# Patient Record
Sex: Male | Born: 1945 | Race: Black or African American | Hispanic: No | Marital: Married | Smoking: Former smoker
Health system: Southern US, Community
[De-identification: ages and names within clinical notes are randomized; demographics above are authoritative.]

## PROBLEM LIST (undated history)

## (undated) DIAGNOSIS — E785 Hyperlipidemia, unspecified: Secondary | ICD-10-CM

## (undated) DIAGNOSIS — M75100 Unspecified rotator cuff tear or rupture of unspecified shoulder, not specified as traumatic: Secondary | ICD-10-CM

## (undated) DIAGNOSIS — K7581 Nonalcoholic steatohepatitis (NASH): Secondary | ICD-10-CM

## (undated) DIAGNOSIS — F411 Generalized anxiety disorder: Secondary | ICD-10-CM

## (undated) DIAGNOSIS — D126 Benign neoplasm of colon, unspecified: Secondary | ICD-10-CM

## (undated) DIAGNOSIS — K7689 Other specified diseases of liver: Secondary | ICD-10-CM

## (undated) DIAGNOSIS — K648 Other hemorrhoids: Secondary | ICD-10-CM

## (undated) DIAGNOSIS — F528 Other sexual dysfunction not due to a substance or known physiological condition: Secondary | ICD-10-CM

## (undated) DIAGNOSIS — F329 Major depressive disorder, single episode, unspecified: Secondary | ICD-10-CM

## (undated) DIAGNOSIS — K5790 Diverticulosis of intestine, part unspecified, without perforation or abscess without bleeding: Secondary | ICD-10-CM

## (undated) DIAGNOSIS — R7309 Other abnormal glucose: Secondary | ICD-10-CM

## (undated) DIAGNOSIS — Z86718 Personal history of other venous thrombosis and embolism: Secondary | ICD-10-CM

## (undated) DIAGNOSIS — I872 Venous insufficiency (chronic) (peripheral): Secondary | ICD-10-CM

## (undated) HISTORY — PX: COLONOSCOPY: SHX174

## (undated) HISTORY — DX: Hyperlipidemia, unspecified: E78.5

## (undated) HISTORY — DX: Other hemorrhoids: K64.8

## (undated) HISTORY — DX: Other specified diseases of liver: K76.89

## (undated) HISTORY — DX: Nonalcoholic steatohepatitis (NASH): K75.81

## (undated) HISTORY — DX: Unspecified rotator cuff tear or rupture of unspecified shoulder, not specified as traumatic: M75.100

## (undated) HISTORY — DX: Personal history of other venous thrombosis and embolism: Z86.718

## (undated) HISTORY — DX: Other abnormal glucose: R73.09

## (undated) HISTORY — DX: Benign neoplasm of colon, unspecified: D12.6

## (undated) HISTORY — DX: Diverticulosis of intestine, part unspecified, without perforation or abscess without bleeding: K57.90

## (undated) HISTORY — DX: Generalized anxiety disorder: F41.1

## (undated) HISTORY — DX: Major depressive disorder, single episode, unspecified: F32.9

## (undated) HISTORY — DX: Other sexual dysfunction not due to a substance or known physiological condition: F52.8

## (undated) HISTORY — DX: Venous insufficiency (chronic) (peripheral): I87.2

---

## 1988-05-11 HISTORY — PX: OTHER SURGICAL HISTORY: SHX169

## 2001-05-11 HISTORY — PX: OTHER SURGICAL HISTORY: SHX169

## 2001-07-09 DIAGNOSIS — D126 Benign neoplasm of colon, unspecified: Secondary | ICD-10-CM

## 2001-07-09 HISTORY — DX: Benign neoplasm of colon, unspecified: D12.6

## 2001-07-27 LAB — HM COLONOSCOPY

## 2001-08-19 ENCOUNTER — Encounter: Payer: Self-pay | Admitting: Vascular Surgery

## 2001-08-22 ENCOUNTER — Ambulatory Visit (HOSPITAL_COMMUNITY): Admission: RE | Admit: 2001-08-22 | Discharge: 2001-08-22 | Payer: Self-pay | Admitting: Vascular Surgery

## 2001-08-22 ENCOUNTER — Encounter (INDEPENDENT_AMBULATORY_CARE_PROVIDER_SITE_OTHER): Payer: Self-pay | Admitting: *Deleted

## 2001-09-28 DIAGNOSIS — D126 Benign neoplasm of colon, unspecified: Secondary | ICD-10-CM

## 2002-05-05 ENCOUNTER — Encounter: Payer: Self-pay | Admitting: Internal Medicine

## 2002-05-05 ENCOUNTER — Ambulatory Visit (HOSPITAL_COMMUNITY): Admission: RE | Admit: 2002-05-05 | Discharge: 2002-05-05 | Payer: Self-pay | Admitting: Internal Medicine

## 2005-01-14 ENCOUNTER — Ambulatory Visit: Payer: Self-pay | Admitting: Endocrinology

## 2005-01-21 ENCOUNTER — Ambulatory Visit: Payer: Self-pay | Admitting: Endocrinology

## 2005-01-29 ENCOUNTER — Ambulatory Visit: Payer: Self-pay | Admitting: Endocrinology

## 2005-05-08 ENCOUNTER — Ambulatory Visit: Payer: Self-pay | Admitting: Endocrinology

## 2005-05-29 ENCOUNTER — Ambulatory Visit: Payer: Self-pay | Admitting: Endocrinology

## 2005-10-30 ENCOUNTER — Ambulatory Visit: Payer: Self-pay | Admitting: Internal Medicine

## 2005-11-13 ENCOUNTER — Ambulatory Visit: Payer: Self-pay

## 2005-11-13 ENCOUNTER — Encounter: Payer: Self-pay | Admitting: Cardiology

## 2006-01-28 ENCOUNTER — Ambulatory Visit: Payer: Self-pay | Admitting: Endocrinology

## 2006-01-29 ENCOUNTER — Ambulatory Visit: Payer: Self-pay | Admitting: Endocrinology

## 2006-02-04 ENCOUNTER — Ambulatory Visit: Payer: Self-pay | Admitting: Endocrinology

## 2007-02-14 ENCOUNTER — Ambulatory Visit: Payer: Self-pay | Admitting: Endocrinology

## 2007-02-14 LAB — CONVERTED CEMR LAB
ALT: 45 units/L (ref 0–53)
AST: 46 units/L — ABNORMAL HIGH (ref 0–37)
Albumin: 4.4 g/dL (ref 3.5–5.2)
Alkaline Phosphatase: 37 units/L — ABNORMAL LOW (ref 39–117)
BUN: 13 mg/dL (ref 6–23)
Basophils Absolute: 0.2 10*3/uL — ABNORMAL HIGH (ref 0.0–0.1)
Basophils Relative: 2.7 % — ABNORMAL HIGH (ref 0.0–1.0)
Bilirubin Urine: NEGATIVE
Bilirubin, Direct: 0.3 mg/dL (ref 0.0–0.3)
CO2: 28 meq/L (ref 19–32)
Calcium: 9.6 mg/dL (ref 8.4–10.5)
Chloride: 105 meq/L (ref 96–112)
Cholesterol: 162 mg/dL (ref 0–200)
Creatinine, Ser: 1 mg/dL (ref 0.4–1.5)
Crystals: NEGATIVE
Eosinophils Absolute: 0.3 10*3/uL (ref 0.0–0.6)
Eosinophils Relative: 3.1 % (ref 0.0–5.0)
GFR calc Af Amer: 98 mL/min
GFR calc non Af Amer: 81 mL/min
Glucose, Bld: 93 mg/dL (ref 70–99)
HCT: 47.5 % (ref 39.0–52.0)
HDL: 52.3 mg/dL (ref >39.0)
Hemoglobin, Urine: NEGATIVE
Hemoglobin: 16.6 g/dL (ref 13.0–17.0)
Ketones, ur: NEGATIVE mg/dL
LDL Cholesterol: 88 mg/dL (ref 0–99)
Lymphocytes Relative: 24.7 % (ref 12.0–46.0)
MCHC: 34.9 g/dL (ref 30.0–36.0)
MCV: 87.5 fL (ref 78.0–100.0)
Monocytes Absolute: 0.7 10*3/uL (ref 0.2–0.7)
Monocytes Relative: 8 % (ref 3.0–11.0)
Neutro Abs: 5.4 10*3/uL (ref 1.4–7.7)
Neutrophils Relative %: 61.5 % (ref 43.0–77.0)
Nitrite: NEGATIVE
PSA: 0.7 ng/mL (ref 0.10–4.00)
Platelets: 214 10*3/uL (ref 150–400)
Potassium: 3.9 meq/L (ref 3.5–5.1)
RBC / HPF: NONE SEEN
RBC: 5.43 M/uL (ref 4.22–5.81)
RDW: 12.9 % (ref 11.5–14.6)
Sodium: 142 meq/L (ref 135–145)
Specific Gravity, Urine: 1.02 (ref 1.000–1.03)
Squamous Epithelial / HPF: NEGATIVE /lpf
TSH: 3.46 microintl units/mL (ref 0.35–5.50)
Total Bilirubin: 1.2 mg/dL (ref 0.3–1.2)
Total CHOL/HDL Ratio: 3.1
Total Protein, Urine: 30 mg/dL — AB
Total Protein: 8 g/dL (ref 6.0–8.3)
Triglycerides: 109 mg/dL (ref 0–149)
Urine Glucose: NEGATIVE mg/dL
Urobilinogen, UA: 1 (ref 0.0–1.0)
VLDL: 22 mg/dL (ref 0–40)
WBC: 8.7 10*3/uL (ref 4.5–10.5)
pH: 7.5 (ref 5.0–8.0)

## 2007-02-16 ENCOUNTER — Encounter: Payer: Self-pay | Admitting: *Deleted

## 2007-02-16 DIAGNOSIS — Z86718 Personal history of other venous thrombosis and embolism: Secondary | ICD-10-CM

## 2007-02-16 DIAGNOSIS — F411 Generalized anxiety disorder: Secondary | ICD-10-CM

## 2007-02-16 DIAGNOSIS — R7309 Other abnormal glucose: Secondary | ICD-10-CM

## 2007-02-16 DIAGNOSIS — D72819 Decreased white blood cell count, unspecified: Secondary | ICD-10-CM

## 2007-02-16 DIAGNOSIS — I872 Venous insufficiency (chronic) (peripheral): Secondary | ICD-10-CM

## 2007-02-16 DIAGNOSIS — F528 Other sexual dysfunction not due to a substance or known physiological condition: Secondary | ICD-10-CM

## 2007-02-16 DIAGNOSIS — F329 Major depressive disorder, single episode, unspecified: Secondary | ICD-10-CM

## 2007-02-16 DIAGNOSIS — F17200 Nicotine dependence, unspecified, uncomplicated: Secondary | ICD-10-CM

## 2007-02-16 DIAGNOSIS — R739 Hyperglycemia, unspecified: Secondary | ICD-10-CM | POA: Insufficient documentation

## 2007-02-16 DIAGNOSIS — F3289 Other specified depressive episodes: Secondary | ICD-10-CM

## 2007-02-16 HISTORY — DX: Personal history of other venous thrombosis and embolism: Z86.718

## 2007-02-16 HISTORY — DX: Major depressive disorder, single episode, unspecified: F32.9

## 2007-02-16 HISTORY — DX: Generalized anxiety disorder: F41.1

## 2007-02-16 HISTORY — DX: Other specified depressive episodes: F32.89

## 2007-02-16 HISTORY — DX: Other sexual dysfunction not due to a substance or known physiological condition: F52.8

## 2007-02-16 HISTORY — DX: Venous insufficiency (chronic) (peripheral): I87.2

## 2007-02-16 HISTORY — DX: Other abnormal glucose: R73.09

## 2007-02-17 ENCOUNTER — Ambulatory Visit: Payer: Self-pay | Admitting: Endocrinology

## 2007-03-04 ENCOUNTER — Ambulatory Visit: Payer: Self-pay | Admitting: Endocrinology

## 2007-03-04 LAB — CONVERTED CEMR LAB
Bilirubin Urine: NEGATIVE
Hemoglobin, Urine: NEGATIVE
Ketones, ur: NEGATIVE mg/dL
Leukocytes, UA: NEGATIVE
Nitrite: NEGATIVE
Specific Gravity, Urine: 1.015 (ref 1.000–1.03)
Total Protein, Urine: NEGATIVE mg/dL
Urine Glucose: NEGATIVE mg/dL
Urobilinogen, UA: 1 (ref 0.0–1.0)
pH: 8 (ref 5.0–8.0)

## 2007-03-18 ENCOUNTER — Encounter: Payer: Self-pay | Admitting: Endocrinology

## 2007-03-21 ENCOUNTER — Telehealth: Payer: Self-pay | Admitting: Endocrinology

## 2008-04-27 ENCOUNTER — Telehealth (INDEPENDENT_AMBULATORY_CARE_PROVIDER_SITE_OTHER): Payer: Self-pay | Admitting: *Deleted

## 2008-05-30 ENCOUNTER — Ambulatory Visit: Payer: Self-pay | Admitting: Endocrinology

## 2008-05-31 LAB — CONVERTED CEMR LAB
ALT: 28 units/L (ref 0–53)
AST: 39 units/L — ABNORMAL HIGH (ref 0–37)
Albumin: 4.1 g/dL (ref 3.5–5.2)
Alkaline Phosphatase: 33 units/L — ABNORMAL LOW (ref 39–117)
BUN: 18 mg/dL (ref 6–23)
Basophils Absolute: 0 10*3/uL (ref 0.0–0.1)
Basophils Relative: 0.6 % (ref 0.0–3.0)
Bilirubin Urine: NEGATIVE
Bilirubin, Direct: 0.3 mg/dL (ref 0.0–0.3)
CO2: 30 meq/L (ref 19–32)
Calcium: 9.2 mg/dL (ref 8.4–10.5)
Chloride: 105 meq/L (ref 96–112)
Cholesterol: 134 mg/dL (ref 0–200)
Creatinine, Ser: 1 mg/dL (ref 0.4–1.5)
Eosinophils Absolute: 0.3 10*3/uL (ref 0.0–0.7)
Eosinophils Relative: 3.7 % (ref 0.0–5.0)
GFR calc Af Amer: 97 mL/min
GFR calc non Af Amer: 80 mL/min
Glucose, Bld: 79 mg/dL (ref 70–99)
HCT: 45.3 % (ref 39.0–52.0)
HDL: 41.2 mg/dL (ref >39.0)
Hemoglobin, Urine: NEGATIVE
Hemoglobin: 15.9 g/dL (ref 13.0–17.0)
Ketones, ur: NEGATIVE mg/dL
LDL Cholesterol: 68 mg/dL (ref 0–99)
Leukocytes, UA: NEGATIVE
Lymphocytes Relative: 32.5 % (ref 12.0–46.0)
MCHC: 35.1 g/dL (ref 30.0–36.0)
MCV: 85.5 fL (ref 78.0–100.0)
Monocytes Absolute: 0.6 10*3/uL (ref 0.1–1.0)
Monocytes Relative: 8.4 % (ref 3.0–12.0)
Neutro Abs: 3.8 10*3/uL (ref 1.4–7.7)
Neutrophils Relative %: 54.8 % (ref 43.0–77.0)
Nitrite: NEGATIVE
PSA: 0.63 ng/mL (ref 0.10–4.00)
Platelets: 201 10*3/uL (ref 150–400)
Potassium: 3.6 meq/L (ref 3.5–5.1)
RBC: 5.29 M/uL (ref 4.22–5.81)
RDW: 12.6 % (ref 11.5–14.6)
Sodium: 140 meq/L (ref 135–145)
Specific Gravity, Urine: 1.015 (ref 1.000–1.03)
TSH: 2.13 microintl units/mL (ref 0.35–5.50)
Total Bilirubin: 1.6 mg/dL — ABNORMAL HIGH (ref 0.3–1.2)
Total CHOL/HDL Ratio: 3.3
Total Protein: 7.5 g/dL (ref 6.0–8.3)
Triglycerides: 126 mg/dL (ref 0–149)
Urine Glucose: NEGATIVE mg/dL
Urobilinogen, UA: 1 (ref 0.0–1.0)
VLDL: 25 mg/dL (ref 0–40)
WBC: 6.9 10*3/uL (ref 4.5–10.5)
pH: 7.5 (ref 5.0–8.0)

## 2008-06-07 ENCOUNTER — Ambulatory Visit: Payer: Self-pay | Admitting: Endocrinology

## 2008-06-07 DIAGNOSIS — K7689 Other specified diseases of liver: Secondary | ICD-10-CM

## 2008-06-07 DIAGNOSIS — M75100 Unspecified rotator cuff tear or rupture of unspecified shoulder, not specified as traumatic: Secondary | ICD-10-CM | POA: Insufficient documentation

## 2008-06-07 DIAGNOSIS — R0609 Other forms of dyspnea: Secondary | ICD-10-CM

## 2008-06-07 DIAGNOSIS — IMO0002 Reserved for concepts with insufficient information to code with codable children: Secondary | ICD-10-CM

## 2008-06-07 DIAGNOSIS — R0600 Dyspnea, unspecified: Secondary | ICD-10-CM | POA: Insufficient documentation

## 2008-06-07 DIAGNOSIS — K7581 Nonalcoholic steatohepatitis (NASH): Secondary | ICD-10-CM

## 2008-06-07 DIAGNOSIS — F1010 Alcohol abuse, uncomplicated: Secondary | ICD-10-CM | POA: Insufficient documentation

## 2008-06-07 HISTORY — DX: Other specified diseases of liver: K76.89

## 2008-06-07 HISTORY — DX: Reserved for concepts with insufficient information to code with codable children: IMO0002

## 2008-06-07 HISTORY — DX: Unspecified rotator cuff tear or rupture of unspecified shoulder, not specified as traumatic: M75.100

## 2008-08-31 ENCOUNTER — Encounter: Payer: Self-pay | Admitting: Endocrinology

## 2008-12-12 ENCOUNTER — Ambulatory Visit: Payer: Self-pay | Admitting: Internal Medicine

## 2008-12-12 DIAGNOSIS — R079 Chest pain, unspecified: Secondary | ICD-10-CM | POA: Insufficient documentation

## 2008-12-20 ENCOUNTER — Telehealth: Payer: Self-pay | Admitting: Endocrinology

## 2009-01-26 ENCOUNTER — Telehealth: Payer: Self-pay | Admitting: Family Medicine

## 2009-02-04 ENCOUNTER — Telehealth: Payer: Self-pay | Admitting: Endocrinology

## 2009-02-26 ENCOUNTER — Telehealth: Payer: Self-pay | Admitting: Endocrinology

## 2009-07-22 ENCOUNTER — Ambulatory Visit: Payer: Self-pay | Admitting: Endocrinology

## 2009-07-23 LAB — CONVERTED CEMR LAB
ALT: 41 units/L (ref 0–53)
AST: 51 units/L — ABNORMAL HIGH (ref 0–37)
Albumin: 4.3 g/dL (ref 3.5–5.2)
Alkaline Phosphatase: 38 units/L — ABNORMAL LOW (ref 39–117)
BUN: 15 mg/dL (ref 6–23)
Basophils Absolute: 0 10*3/uL (ref 0.0–0.1)
Basophils Relative: 0.6 % (ref 0.0–3.0)
Bilirubin Urine: NEGATIVE
Bilirubin, Direct: 0.2 mg/dL (ref 0.0–0.3)
CO2: 30 meq/L (ref 19–32)
Calcium: 9.5 mg/dL (ref 8.4–10.5)
Chloride: 104 meq/L (ref 96–112)
Cholesterol: 191 mg/dL (ref 0–200)
Creatinine, Ser: 0.9 mg/dL (ref 0.4–1.5)
Eosinophils Absolute: 0.3 10*3/uL (ref 0.0–0.7)
Eosinophils Relative: 4.6 % (ref 0.0–5.0)
GFR calc non Af Amer: 109.55 mL/min (ref >60)
Glucose, Bld: 76 mg/dL (ref 70–99)
HCT: 47.9 % (ref 39.0–52.0)
HDL: 58.7 mg/dL (ref >39.00)
Hemoglobin, Urine: NEGATIVE
Hemoglobin: 16.2 g/dL (ref 13.0–17.0)
Ketones, ur: NEGATIVE mg/dL
LDL Cholesterol: 97 mg/dL (ref 0–99)
Leukocytes, UA: NEGATIVE
Lymphocytes Relative: 28.7 % (ref 12.0–46.0)
Lymphs Abs: 2.1 10*3/uL (ref 0.7–4.0)
MCHC: 33.8 g/dL (ref 30.0–36.0)
MCV: 92.2 fL (ref 78.0–100.0)
Monocytes Absolute: 0.4 10*3/uL (ref 0.1–1.0)
Monocytes Relative: 5.6 % (ref 3.0–12.0)
Neutro Abs: 4.4 10*3/uL (ref 1.4–7.7)
Neutrophils Relative %: 60.5 % (ref 43.0–77.0)
Nitrite: NEGATIVE
PSA: 0.78 ng/mL (ref 0.10–4.00)
Platelets: 209 10*3/uL (ref 150.0–400.0)
Potassium: 4 meq/L (ref 3.5–5.1)
RBC: 5.2 M/uL (ref 4.22–5.81)
RDW: 13.7 % (ref 11.5–14.6)
Sodium: 141 meq/L (ref 135–145)
Specific Gravity, Urine: 1.02 (ref 1.000–1.030)
TSH: 3.38 microintl units/mL (ref 0.35–5.50)
Total Bilirubin: 1 mg/dL (ref 0.3–1.2)
Total CHOL/HDL Ratio: 3
Total Protein, Urine: NEGATIVE mg/dL
Total Protein: 8 g/dL (ref 6.0–8.3)
Triglycerides: 176 mg/dL — ABNORMAL HIGH (ref 0.0–149.0)
Urine Glucose: NEGATIVE mg/dL
Urobilinogen, UA: 0.2 (ref 0.0–1.0)
VLDL: 35.2 mg/dL (ref 0.0–40.0)
WBC: 7.2 10*3/uL (ref 4.5–10.5)
pH: 7 (ref 5.0–8.0)

## 2009-07-25 ENCOUNTER — Ambulatory Visit: Payer: Self-pay | Admitting: Endocrinology

## 2009-07-25 DIAGNOSIS — R109 Unspecified abdominal pain: Secondary | ICD-10-CM | POA: Insufficient documentation

## 2009-07-25 DIAGNOSIS — R930 Abnormal findings on diagnostic imaging of skull and head, not elsewhere classified: Secondary | ICD-10-CM | POA: Insufficient documentation

## 2010-06-12 NOTE — Assessment & Plan Note (Signed)
Summary: PHYSICAL  PER WIFE  STC   Vital Signs:  Patient profile:   65 year old male Height:      71 inches (180.34 cm) Weight:      263.38 pounds (119.72 kg) BMI:     36.87 O2 Sat:      93 % on Room air Temp:     98.4 degrees F (36.89 degrees C) oral Pulse rate:   75 / minute BP sitting:   128 / 76  (left arm) Cuff size:   large  Vitals Entered By: Gardenia Phlegm RMA (July 25, 2009 4:06 PM)  O2 Flow:  Room air CC: Physical/ pt states he has had stomach aches X1week/ CF   CC:  Physical/ pt states he has had stomach aches X1week/ CF.  History of Present Illness: here for regular wellness examination.  He's feeling pretty well in general.  he is a smoker, and alcohol is 4-5 oz per day.     Current Medications (verified): 1)  Vytorin 10-80 Mg  Tabs (Ezetimibe-Simvastatin) .... Take 1 By Mouth Qd 2)  Itraconazole 100 Mg Caps (Itraconazole) .... 2-Bid ("pulse") 3)  Tramadol-Acetaminophen 37.5-325 Mg Tabs (Tramadol-Acetaminophen) .Marland Kitchen.. 1 Q4h As Needed Pain 4)  Cialis 20 Mg Tabs (Tadalafil) .... Use Prn 5)  Losartan Potassium-Hctz 100-25 Mg Tabs (Losartan Potassium-Hctz) .Marland Kitchen.. 1 Qd  Allergies (verified): No Known Drug Allergies  Family History: Reviewed history from 06/07/2008 and no changes required. mother had uncertain type of cancer sister had lung cancer no heart dz.  Social History: Reviewed history from 06/07/2008 and no changes required. married. works Biomedical scientist. grandson (whom pt and wife raised) died of cancer 07-21-2009.  Review of Systems       The patient complains of weight gain.  The patient denies fever, vision loss, decreased hearing, chest pain, syncope, dyspnea on exertion, prolonged cough, headaches, melena, hematochezia, severe indigestion/heartburn, hematuria, suspicious skin lesions, and depression.    Physical Exam  General:  normal appearance.   Head:  head: no deformity eyes: no periorbital swelling, no proptosis external nose and ears are  normal mouth: no lesion seen Neck:  Supple without thyroid enlargement or tenderness.  Chest Wall:  long left thoracotomy scar Heart:  Regular rate and rhythm without murmurs or gallops noted. Normal S1,S2.   Prostate:  Normal size prostate without masses or tenderness.  Msk:  muscle bulk and strength are grossly normal.  no obvious joint swelling.  gait is normal and steady  Pulses:  dorsalis pedis intact bilat.  no carotid bruit  Extremities:  no deformity.  no ulcer on the feet.  feet are of normal color and temp.  no edema there are moderatre varicosities on the left leg there is bilateral onychomycosis. Neurologic:  cn 2-12 grossly intact.   readily moves all 4's.   sensation is intact to touch on the feet  Skin:  normal texture and temp.  no rash.  not diaphoretic  Cervical Nodes:  No significant adenopathy.  Psych:  Alert and cooperative; normal mood and affect; normal attention span and concentration.   Additional Exam:  SEPARATE EVALUATION FOLLOWS--EACH PROBLEM HERE IS NEW, NOT RESPONDING TO TREATMENT, OR POSES SIGNIFICANT RISK TO THE PATIENT'S HEALTH: HISTORY OF THE PRESENT ILLNESS: pt states 1 week of intermittent pain at and slightly inferior to the periumbilical area.  no associated n/v. PAST MEDICAL HISTORY reviewed and up to date today. REVIEW OF SYSTEMS: denies dysuria and decreased urinary stream PHYSICAL EXAMINATION: abdomen is soft, nontender.  no  hepatosplenomegaly.   not distended.  no hernia normal external and internal exam.  heme neg clear to auscultation.  no respiratory distress LAB/XRAY RESULTS: cbc is normal IMPRESSION: abdominal pain, uncertain etiology PLAN: see instruction sheet    Impression & Recommendations:  Problem # 1:  ROUTINE GENERAL MEDICAL EXAM@HEALTH  CARE FACL (ICD-V70.0)  Other Orders: EKG w/ Interpretation (93000) T-2 View CXR (71020TC) Est. Patient Level III (94712) Est. Patient 40-64 years (52712)   Patient  Instructions: 1)  here are some samples of nexium 40 mg, to take once daily.  if your abdominal pain is not better in 1 week, call us. 2)  here are some samples of diovan-hct, 320/25, to take once daily instead of losartan-hct, to save you money.  when you run out of these, go back to the losartan-hct. 3)  return 1 year. 4)  we discussed the recommendations of the preventive services task force Prescriptions: VYTORIN 10-80 MG  TABS (EZETIMIBE-SIMVASTATIN) take 1 by mouth qd  #30 Tablet x 11   Entered and Authorized by:   Donavan Foil MD   Signed by:   Donavan Foil MD on 07/25/2009   Method used:   [facility]        [facility]  [address] [phone]* (retail)       [place] [address]       [place], [place]  [address]       Ph: [phone] or [phone]       Fax: [phone]   RxID:   [id number]

## 2010-08-11 ENCOUNTER — Other Ambulatory Visit: Payer: Self-pay | Admitting: Endocrinology

## 2010-09-26 NOTE — Op Note (Signed)
[facility]  Patient:    Kurt Blair, Kurt Blair Visit Number: [id number] MRN: [id number]          Service Type: DSU Location: [facility] [address] Attending Physician:  Derry Lory Dictated by:   Nelda Severe Kellie Simmering, M.D. Proc. Date: 08/22/01 Admit Date:  08/22/2001 Discharge Date: 08/22/2001                             Operative Report  PREOPERATIVE DIAGNOSIS:  Severe varicose veins, greater saphenous system, left leg, with recent venous stasis ulceration and chronic venous insufficiency.  POSTOPERATIVE DIAGNOSIS:  Severe varicose veins, greater saphenous system, left leg, with recent venous stasis ulceration and chronic venous insufficiency.  OPERATION: 1. Ligation and stripping, greater saphenous vein, left leg. 2. Excision of multiple varicosities of left leg.  SURGEON:  Nelda Severe. Kellie Simmering, M.D.  FIRST ASSISTANT:  Gina H. Collins, P.A.-C.  ANESTHESIA:  General endotracheal.  DESCRIPTION OF PROCEDURE:  Patient was taken to the operating room and placed in the upright position, at which time the varicosities were marked.  These were in the distal thigh and calf region anteriorly and posteriorly from the greater saphenous system, with one extending back into the lesser saphenous area.  There was severe hyperpigmentation and venous stasis disease from the mid-calf distally.  Patient was then placed in the supine position and satisfactory general endotracheal anesthesia was administered.  The left leg was prepped with Betadine scrubbing solution and draped in routine sterile manner.  A short longitudinal incision was made at the ankle and the saphenous vein dissected free from the saphenous nerve, ligated distally with 2-0 silk tie, intraluminal stripper passed in proximally, where it was palpated at the saphenofemoral junction.  A short oblique incision was made and the saphenous vein dissected free, the branches all ligated with 2-0 silk ties and  divided. The saphenous vein was then ligated at the saphenofemoral junction, transected and the medium-sized stripper head secured with a 2-0 silk tie.  Short transverse incisions were then made over the marked varicosities and these were all removed using both dissection and avulsion techniques.  When this had been completed, the patients leg was wrapped with compression wrapping (non-Latex) and the vein was stripped from proximal to distal.  It could only be stripped to the proximal calf because of the inflammatory changes around the distal saphenous vein and this necessitating another incision in the proximal calf region, where the vein was removed, and the distal saphenous vein was ligated proximally and distally.  Adequate hemostasis was achieved. Following completion of this, the wounds were closed with Vicryl in a subcuticular fashion with Steri-Strips, sterile dressing applied and patient taken to the recovery room in satisfactory condition.Dictated by:   Nelda Severe Kellie Simmering, M.D. Attending Physician:  Derry Lory DD:  08/22/01 TD:  08/22/01 Job: [id number]

## 2010-10-09 ENCOUNTER — Other Ambulatory Visit: Payer: Self-pay | Admitting: Endocrinology

## 2010-10-10 ENCOUNTER — Other Ambulatory Visit: Payer: Self-pay | Admitting: Endocrinology

## 2010-11-06 ENCOUNTER — Other Ambulatory Visit: Payer: Self-pay | Admitting: Endocrinology

## 2010-12-08 ENCOUNTER — Other Ambulatory Visit: Payer: Self-pay | Admitting: Endocrinology

## 2010-12-09 ENCOUNTER — Other Ambulatory Visit: Payer: Self-pay | Admitting: *Deleted

## 2010-12-09 MED ORDER — LOSARTAN POTASSIUM-HCTZ 100-25 MG PO TABS: 1.0000 | ORAL_TABLET | Freq: Every day | ORAL | Status: DC

## 2010-12-09 NOTE — Telephone Encounter (Signed)
Pt needs refill of BP medication before appt for CPX. PT has appointment scheduled for 01/02/2011. Left message for pt to callback office to inform pt that BP med has been sent to pharmacy.

## 2010-12-24 ENCOUNTER — Encounter: Payer: Self-pay | Admitting: *Deleted

## 2010-12-24 NOTE — Telephone Encounter (Signed)
A user error has taken place: encounter opened in error, closed for administrative reasons.

## 2010-12-26 ENCOUNTER — Ambulatory Visit: Payer: Self-pay

## 2010-12-26 DIAGNOSIS — Z0389 Encounter for observation for other suspected diseases and conditions ruled out: Secondary | ICD-10-CM

## 2010-12-26 DIAGNOSIS — Z0000 Encounter for general adult medical examination without abnormal findings: Secondary | ICD-10-CM

## 2010-12-26 LAB — PSA: PSA: 0.88 ng/mL (ref 0.10–4.00)

## 2010-12-26 LAB — URINALYSIS, ROUTINE W REFLEX MICROSCOPIC
Hgb urine dipstick: NEGATIVE
Leukocytes, UA: NEGATIVE
Nitrite: NEGATIVE
pH: 6 (ref 5.0–8.0)

## 2010-12-26 LAB — BASIC METABOLIC PANEL
BUN: 15 mg/dL (ref 6–23)
Chloride: 101 mEq/L (ref 96–112)
GFR: 123.15 mL/min (ref >60.00)
Glucose, Bld: 103 mg/dL — ABNORMAL HIGH (ref 70–99)
Potassium: 4.4 mEq/L (ref 3.5–5.1)
Sodium: 137 mEq/L (ref 135–145)

## 2010-12-26 LAB — CBC WITH DIFFERENTIAL/PLATELET
Basophils Relative: 0.7 % (ref 0.0–3.0)
Eosinophils Relative: 8.3 % — ABNORMAL HIGH (ref 0.0–5.0)
Lymphocytes Relative: 41.4 % (ref 12.0–46.0)
MCV: 91 fl (ref 78.0–100.0)
Monocytes Absolute: 0.5 10*3/uL (ref 0.1–1.0)
Monocytes Relative: 11 % (ref 3.0–12.0)
Neutrophils Relative %: 38.6 % — ABNORMAL LOW (ref 43.0–77.0)
Platelets: 199 10*3/uL (ref 150.0–400.0)
RBC: 5.16 Mil/uL (ref 4.22–5.81)
WBC: 4.8 10*3/uL (ref 4.5–10.5)

## 2010-12-26 LAB — LIPID PANEL
HDL: 49.6 mg/dL (ref >39.00)
Total CHOL/HDL Ratio: 4
VLDL: 25.8 mg/dL (ref 0.0–40.0)

## 2010-12-26 LAB — HEPATIC FUNCTION PANEL
Albumin: 4.3 g/dL (ref 3.5–5.2)
Bilirubin, Direct: 0.1 mg/dL (ref 0.0–0.3)
Total Protein: 7.4 g/dL (ref 6.0–8.3)

## 2011-01-02 ENCOUNTER — Ambulatory Visit (INDEPENDENT_AMBULATORY_CARE_PROVIDER_SITE_OTHER): Payer: BC Managed Care – PPO | Admitting: Endocrinology

## 2011-01-02 ENCOUNTER — Encounter: Payer: Self-pay | Admitting: Endocrinology

## 2011-01-02 ENCOUNTER — Ambulatory Visit (INDEPENDENT_AMBULATORY_CARE_PROVIDER_SITE_OTHER)
Admission: RE | Admit: 2011-01-02 | Discharge: 2011-01-02 | Disposition: A | Discharge status: Home / Self Care | Payer: BC Managed Care – PPO | Source: Ambulatory Visit | Attending: Endocrinology | Admitting: Endocrinology

## 2011-01-02 VITALS — BP 124/72 | HR 65 | Temp 98.6°F | Ht 72.0 in | Wt 244.6 lb

## 2011-01-02 DIAGNOSIS — Z0000 Encounter for general adult medical examination without abnormal findings: Secondary | ICD-10-CM

## 2011-01-02 DIAGNOSIS — Z23 Encounter for immunization: Secondary | ICD-10-CM

## 2011-01-02 DIAGNOSIS — R0602 Shortness of breath: Secondary | ICD-10-CM

## 2011-01-02 MED ORDER — LOSARTAN POTASSIUM-HCTZ 100-25 MG PO TABS: 1.0000 | ORAL_TABLET | Freq: Every day | ORAL | Status: DC
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
## 2011-01-02 NOTE — Patient Instructions (Addendum)
please consider these measures for your health:  minimize alcohol.  do not use tobacco products.  have a colonoscopy at least every 10 years from age 64.  Women should have an annual mammogram from age 62.  keep firearms safely stored.  always use seat belts.  have working smoke alarms in your home.  see an eye doctor and dentist regularly.  never drive under the influence of alcohol or drugs (including prescription drugs).   please let me know what your wishes would be, if artificial life support measures should become necessary.  it is critically important to prevent falling down (keep floor areas well-lit, dry, and free of loose objects). Please resume the vytorin.  Here are some samples. Please return in 1 year. A chest x-ray is being requested for you today.  please call [phone] to hear your test results.  You will be prompted to enter the 9-digit "MRN" number that appears at the top left of this page, followed by #.  Then you will hear the message. You should also have a vaccine against shingles (a painful rash which results from the  chickenpox infection which most people had many years ago).  This vaccine reduces, but does not totally eliminate the risk of shingles.  Because this is a medicare part d benefit, there are 3 ways you can get it:  You can go to a pharmacy and get the injection (I can give you a prescription), or I can give you a prescription to have filled at a pharmacy, and bring back here for [place] to give.  The other option is that you can pay up-front for it, and we'll give you a form to make a claim for reimbursement from your medicare part d carrier.

## 2011-04-04 ENCOUNTER — Other Ambulatory Visit: Payer: Self-pay | Admitting: Endocrinology

## 2011-04-07 ENCOUNTER — Other Ambulatory Visit: Payer: Self-pay

## 2011-04-07 MED ORDER — LOSARTAN POTASSIUM-HCTZ 100-25 MG PO TABS: 1.0000 | ORAL_TABLET | Freq: Every day | ORAL | Status: DC

## 2011-06-30 ENCOUNTER — Other Ambulatory Visit: Payer: Self-pay | Admitting: Endocrinology

## 2011-07-02 ENCOUNTER — Telehealth: Payer: Self-pay | Admitting: Endocrinology

## 2011-07-02 MED ORDER — ATORVASTATIN CALCIUM 80 MG PO TABS: 80.0000 mg | ORAL_TABLET | Freq: Every day | ORAL | Status: DC

## 2011-07-02 NOTE — Telephone Encounter (Signed)
[facility]  [employer]

## 2011-07-02 NOTE — Telephone Encounter (Signed)
Pt informed of new cholesterol medication via VM and to callback office with any questions/concerns.

## 2011-07-02 NOTE — Telephone Encounter (Signed)
done

## 2011-12-23 ENCOUNTER — Other Ambulatory Visit: Payer: Self-pay | Admitting: Endocrinology

## 2012-03-01 ENCOUNTER — Other Ambulatory Visit: Payer: Self-pay | Admitting: Endocrinology

## 2012-03-30 ENCOUNTER — Other Ambulatory Visit: Payer: Self-pay | Admitting: Endocrinology

## 2012-05-02 ENCOUNTER — Telehealth: Payer: Self-pay | Admitting: Endocrinology

## 2012-05-02 ENCOUNTER — Other Ambulatory Visit: Payer: Self-pay

## 2012-05-02 MED ORDER — LOSARTAN POTASSIUM-HCTZ 100-25 MG PO TABS: 1.0000 | ORAL_TABLET | Freq: Every day | ORAL | Status: DC

## 2012-05-02 NOTE — Telephone Encounter (Signed)
Patient states that he would like a [facility] [address]

## 2012-05-20 ENCOUNTER — Telehealth: Payer: Self-pay | Admitting: *Deleted

## 2012-05-20 ENCOUNTER — Other Ambulatory Visit: Payer: Self-pay | Admitting: *Deleted

## 2012-05-20 DIAGNOSIS — Z0389 Encounter for observation for other suspected diseases and conditions ruled out: Secondary | ICD-10-CM

## 2012-05-20 DIAGNOSIS — Z0000 Encounter for general adult medical examination without abnormal findings: Secondary | ICD-10-CM

## 2012-05-20 NOTE — Telephone Encounter (Signed)
PATIENT WALKED INTO CLINIC REQEUSTING LAB WORK BE DONE HERE AT [facility] BECAUSE HE HAS APPT. WITH DR. Loanne Drilling NEXT WEEK. LAB ORDERS DONE FOR [facility], PRINTED AND GIVEN TO  PATIENT.

## 2012-05-27 ENCOUNTER — Encounter: Payer: BC Managed Care – PPO | Admitting: Endocrinology

## 2012-06-02 ENCOUNTER — Other Ambulatory Visit: Payer: Self-pay | Admitting: *Deleted

## 2012-06-02 MED ORDER — LOSARTAN POTASSIUM-HCTZ 100-25 MG PO TABS: 1.0000 | ORAL_TABLET | Freq: Every day | ORAL | Status: DC

## 2012-06-03 ENCOUNTER — Encounter: Payer: Self-pay | Admitting: Endocrinology

## 2012-06-03 ENCOUNTER — Other Ambulatory Visit: Payer: Self-pay | Admitting: Endocrinology

## 2012-06-03 ENCOUNTER — Ambulatory Visit (INDEPENDENT_AMBULATORY_CARE_PROVIDER_SITE_OTHER): Payer: Medicare Other | Admitting: Endocrinology

## 2012-06-03 ENCOUNTER — Telehealth: Payer: Self-pay

## 2012-06-03 VITALS — BP 134/70 | HR 60 | Temp 97.8°F | Wt 251.0 lb

## 2012-06-03 DIAGNOSIS — K7689 Other specified diseases of liver: Secondary | ICD-10-CM

## 2012-06-03 DIAGNOSIS — M25519 Pain in unspecified shoulder: Secondary | ICD-10-CM

## 2012-06-03 DIAGNOSIS — D72819 Decreased white blood cell count, unspecified: Secondary | ICD-10-CM

## 2012-06-03 DIAGNOSIS — M25511 Pain in right shoulder: Secondary | ICD-10-CM

## 2012-06-03 DIAGNOSIS — Z0000 Encounter for general adult medical examination without abnormal findings: Secondary | ICD-10-CM | POA: Insufficient documentation

## 2012-06-03 DIAGNOSIS — R7309 Other abnormal glucose: Secondary | ICD-10-CM

## 2012-06-03 DIAGNOSIS — F329 Major depressive disorder, single episode, unspecified: Secondary | ICD-10-CM

## 2012-06-03 DIAGNOSIS — Z125 Encounter for screening for malignant neoplasm of prostate: Secondary | ICD-10-CM

## 2012-06-03 LAB — HEPATIC FUNCTION PANEL
Albumin: 4.4 g/dL (ref 3.5–5.2)
Total Protein: 7.2 g/dL (ref 6.0–8.3)

## 2012-06-03 LAB — HEMOGLOBIN A1C
Hgb A1c MFr Bld: 6.1 % — ABNORMAL HIGH (ref <5.7)
Mean Plasma Glucose: 128 mg/dL — ABNORMAL HIGH (ref <117)

## 2012-06-03 LAB — LIPID PANEL
HDL: 45 mg/dL (ref >39)
LDL Cholesterol: 58 mg/dL (ref 0–99)
Total CHOL/HDL Ratio: 2.8 Ratio
Triglycerides: 106 mg/dL (ref <150)
VLDL: 21 mg/dL (ref 0–40)

## 2012-06-03 LAB — CBC WITH DIFFERENTIAL/PLATELET
Eosinophils Absolute: 0.2 10*3/uL (ref 0.0–0.7)
Eosinophils Relative: 5 % (ref 0–5)
Lymphs Abs: 1.7 10*3/uL (ref 0.7–4.0)
MCH: 29.6 pg (ref 26.0–34.0)
MCV: 84.9 fL (ref 78.0–100.0)
Platelets: 268 10*3/uL (ref 150–400)
RDW: 14.7 % (ref 11.5–15.5)

## 2012-06-03 LAB — BASIC METABOLIC PANEL
CO2: 27 mEq/L (ref 19–32)
Chloride: 101 mEq/L (ref 96–112)
Potassium: 4.1 mEq/L (ref 3.5–5.3)

## 2012-06-03 LAB — TSH: TSH: 3.287 u[IU]/mL (ref 0.350–4.500)

## 2012-06-03 MED ORDER — LOSARTAN POTASSIUM-HCTZ 100-25 MG PO TABS: 1.0000 | ORAL_TABLET | Freq: Every day | ORAL | Status: DC
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
## 2012-06-03 NOTE — Patient Instructions (Addendum)
blood tests are being requested for you today.  We'll contact you with results. please consider these measures for your health:  minimize alcohol.  do not use tobacco products.  have a colonoscopy at least every 10 years from age 67.  keep firearms safely stored.  always use seat belts.  have working smoke alarms in your home.  see an eye doctor and dentist regularly.  never drive under the influence of alcohol or drugs (including prescription drugs).   please let me know what your wishes would be, if artificial life support measures should become necessary.  it is critically important to prevent falling down (keep floor areas well-lit, dry, and free of loose objects.  If you have a cane, walker, or wheelchair, you should use it, even for short trips around the house.  Also, try not to rush).   Call us if you want to see an orthopedic specialist for your shoulder.   If your liver tests are ok, i'll prescribe for you a pill for the toenail fungus.   you will receive a phone call, about a day and time for an appointment, for a colonoscopy.

## 2012-06-03 NOTE — Telephone Encounter (Signed)
Pt given paper work for labs

## 2012-06-03 NOTE — Telephone Encounter (Signed)
*  Pt in lobby*   Pt has CPE appt today and would like to get labs done this morning and is upset that his bp meds have not been refilled.

## 2012-06-17 ENCOUNTER — Encounter: Payer: Self-pay | Admitting: Endocrinology

## 2012-06-25 ENCOUNTER — Other Ambulatory Visit: Payer: Self-pay

## 2012-07-22 ENCOUNTER — Telehealth: Payer: Self-pay | Admitting: Endocrinology

## 2012-07-22 MED ORDER — ATORVASTATIN CALCIUM 80 MG PO TABS: 80.0000 mg | ORAL_TABLET | Freq: Every day | ORAL | Status: DC

## 2012-07-22 NOTE — Telephone Encounter (Signed)
Patient is requesting refill of his cholesterol medication, atorvastatin 8 mg, [facility] [address]   The patient may be reached at [phone] if needed.

## 2012-08-31 ENCOUNTER — Telehealth: Payer: Self-pay

## 2012-08-31 NOTE — Telephone Encounter (Signed)
Lab advised

## 2012-08-31 NOTE — Telephone Encounter (Signed)
Verdis Frederickson with [facility] lab called pt has labs on 06/03/12 tsh, hgb aic, psa and lipid codes given were v70 and v71.1 medicare will not pay with these dx codes [phone]

## 2012-08-31 NOTE — Telephone Encounter (Signed)
a1c is 790.29 psa is v76.44 tsh is 300.00 Lipid is 272.0

## 2012-12-14 ENCOUNTER — Other Ambulatory Visit: Payer: Self-pay

## 2013-03-16 ENCOUNTER — Other Ambulatory Visit: Payer: Self-pay

## 2013-05-25 ENCOUNTER — Other Ambulatory Visit: Payer: Self-pay | Admitting: Endocrinology

## 2013-05-26 ENCOUNTER — Other Ambulatory Visit: Payer: Self-pay | Admitting: Endocrinology

## 2013-08-01 ENCOUNTER — Other Ambulatory Visit: Payer: Self-pay

## 2013-08-01 MED ORDER — ATORVASTATIN CALCIUM 80 MG PO TABS: 80.0000 mg | ORAL_TABLET | Freq: Every day | ORAL | Status: DC

## 2013-09-27 ENCOUNTER — Other Ambulatory Visit: Payer: Self-pay | Admitting: Endocrinology

## 2013-11-26 ENCOUNTER — Other Ambulatory Visit: Payer: Self-pay | Admitting: Endocrinology

## 2013-11-27 ENCOUNTER — Other Ambulatory Visit: Payer: Self-pay

## 2013-11-27 MED ORDER — ATORVASTATIN CALCIUM 80 MG PO TABS: ORAL_TABLET | ORAL | Status: DC

## 2013-11-27 MED ORDER — LOSARTAN POTASSIUM-HCTZ 100-25 MG PO TABS: ORAL_TABLET | ORAL | Status: DC

## 2014-03-05 ENCOUNTER — Telehealth: Payer: Self-pay | Admitting: Endocrinology

## 2014-03-05 ENCOUNTER — Other Ambulatory Visit: Payer: Self-pay | Admitting: Endocrinology

## 2014-03-05 MED ORDER — ATORVASTATIN CALCIUM 80 MG PO TABS: ORAL_TABLET | ORAL | Status: DC

## 2014-03-05 NOTE — Telephone Encounter (Signed)
Rx sent to pharmacy   

## 2014-03-05 NOTE — Telephone Encounter (Signed)
Pt would like his cholesterol med refilled please he made an appt for wednesday

## 2014-03-07 ENCOUNTER — Ambulatory Visit: Payer: Medicare Other | Admitting: Endocrinology

## 2014-03-08 ENCOUNTER — Other Ambulatory Visit: Payer: Self-pay

## 2014-03-08 ENCOUNTER — Other Ambulatory Visit (INDEPENDENT_AMBULATORY_CARE_PROVIDER_SITE_OTHER): Payer: Medicare Other

## 2014-03-08 DIAGNOSIS — Z79899 Other long term (current) drug therapy: Secondary | ICD-10-CM

## 2014-03-08 DIAGNOSIS — Z0000 Encounter for general adult medical examination without abnormal findings: Secondary | ICD-10-CM

## 2014-03-08 DIAGNOSIS — Z125 Encounter for screening for malignant neoplasm of prostate: Secondary | ICD-10-CM

## 2014-03-08 LAB — LIPID PANEL
Cholesterol: 125 mg/dL (ref 0–200)
HDL: 40.7 mg/dL (ref >39.00)
LDL CALC: 47 mg/dL (ref 0–99)
NONHDL: 84.3
Total CHOL/HDL Ratio: 3
Triglycerides: 186 mg/dL — ABNORMAL HIGH (ref 0.0–149.0)
VLDL: 37.2 mg/dL (ref 0.0–40.0)

## 2014-03-08 LAB — URINALYSIS, ROUTINE W REFLEX MICROSCOPIC
Bilirubin Urine: NEGATIVE
Hgb urine dipstick: NEGATIVE
KETONES UR: NEGATIVE
Leukocytes, UA: NEGATIVE
Nitrite: NEGATIVE
SPECIFIC GRAVITY, URINE: 1.015 (ref 1.000–1.030)
TOTAL PROTEIN, URINE-UPE24: NEGATIVE
UROBILINOGEN UA: 0.2 (ref 0.0–1.0)
Urine Glucose: NEGATIVE
pH: 6.5 (ref 5.0–8.0)

## 2014-03-08 LAB — CBC WITH DIFFERENTIAL/PLATELET
BASOS PCT: 0.5 % (ref 0.0–3.0)
Basophils Absolute: 0 10*3/uL (ref 0.0–0.1)
EOS ABS: 0.3 10*3/uL (ref 0.0–0.7)
Eosinophils Relative: 4.5 % (ref 0.0–5.0)
HCT: 43.5 % (ref 39.0–52.0)
Hemoglobin: 14.6 g/dL (ref 13.0–17.0)
LYMPHS PCT: 31.8 % (ref 12.0–46.0)
Lymphs Abs: 2 10*3/uL (ref 0.7–4.0)
MCHC: 33.6 g/dL (ref 30.0–36.0)
MCV: 90 fl (ref 78.0–100.0)
Monocytes Absolute: 0.6 10*3/uL (ref 0.1–1.0)
Monocytes Relative: 8.8 % (ref 3.0–12.0)
Neutro Abs: 3.5 10*3/uL (ref 1.4–7.7)
Neutrophils Relative %: 54.4 % (ref 43.0–77.0)
Platelets: 219 10*3/uL (ref 150.0–400.0)
RBC: 4.84 Mil/uL (ref 4.22–5.81)
RDW: 14.2 % (ref 11.5–15.5)
WBC: 6.4 10*3/uL (ref 4.0–10.5)

## 2014-03-08 LAB — HEMOGLOBIN A1C: HEMOGLOBIN A1C: 5.8 % (ref 4.6–6.5)

## 2014-03-08 LAB — BASIC METABOLIC PANEL
BUN: 14 mg/dL (ref 6–23)
CHLORIDE: 103 meq/L (ref 96–112)
CO2: 26 meq/L (ref 19–32)
CREATININE: 1 mg/dL (ref 0.4–1.5)
Calcium: 9.4 mg/dL (ref 8.4–10.5)
GFR: 92.42 mL/min (ref >60.00)
Glucose, Bld: 97 mg/dL (ref 70–99)
Potassium: 3.8 mEq/L (ref 3.5–5.1)
Sodium: 139 mEq/L (ref 135–145)

## 2014-03-09 LAB — PSA: PSA: 0.95 ng/mL (ref 0.10–4.00)

## 2014-03-09 LAB — TSH: TSH: 5.24 u[IU]/mL — AB (ref 0.35–4.50)

## 2014-03-12 ENCOUNTER — Ambulatory Visit (INDEPENDENT_AMBULATORY_CARE_PROVIDER_SITE_OTHER): Payer: Medicare Other | Admitting: Endocrinology

## 2014-03-12 ENCOUNTER — Ambulatory Visit
Admission: RE | Admit: 2014-03-12 | Discharge: 2014-03-12 | Disposition: A | Discharge status: Home / Self Care | Payer: Medicare Other | Source: Ambulatory Visit | Attending: Endocrinology | Admitting: Endocrinology

## 2014-03-12 ENCOUNTER — Encounter: Payer: Self-pay | Admitting: Endocrinology

## 2014-03-12 VITALS — BP 146/98 | HR 73 | Temp 97.8°F | Ht 70.0 in | Wt 259.0 lb

## 2014-03-12 DIAGNOSIS — R0602 Shortness of breath: Secondary | ICD-10-CM

## 2014-03-12 DIAGNOSIS — Z0000 Encounter for general adult medical examination without abnormal findings: Secondary | ICD-10-CM

## 2014-03-12 DIAGNOSIS — E039 Hypothyroidism, unspecified: Secondary | ICD-10-CM

## 2014-03-12 DIAGNOSIS — Z23 Encounter for immunization: Secondary | ICD-10-CM

## 2014-03-12 DIAGNOSIS — B351 Tinea unguium: Secondary | ICD-10-CM

## 2014-03-12 MED ORDER — TERBINAFINE HCL 250 MG PO TABS: 250.0000 mg | ORAL_TABLET | Freq: Every day | ORAL | Status: DC

## 2014-03-12 MED ORDER — CARVEDILOL 3.125 MG PO TABS: 3.1250 mg | ORAL_TABLET | Freq: Two times a day (BID) | ORAL | Status: DC

## 2014-03-12 NOTE — Patient Instructions (Addendum)
i have sent a prescription to your pharmacy, for an additional blood-pressure pill.  Please have your blood pressure rechecked here in 2 weeks.   please consider these measures for your health:  minimize alcohol.  do not use tobacco products.  have a colonoscopy at least every 10 years from age 68.  keep firearms safely stored.  always use seat belts.  have working smoke alarms in your home.  see an eye doctor and dentist regularly.  never drive under the influence of alcohol or drugs (including prescription drugs).   i have sent a prescription to your pharmacy, for the toenail fungus.  Please do not start until you have heard from our office the the liver tests from last week were normal.   A chest-x-ray is requested for you today.  We'll contact you with results.

## 2014-03-12 NOTE — Progress Notes (Signed)
Subjective:    Patient ID: Kurt Blair, male    DOB: [birth year], 68 y.o.   MRN: [id number]  HPI Pt is here for regular wellness examination, and is feeling pretty well in general, and says chronic med probs are stable, except as noted below Past Medical History  Diagnosis Date  . 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 ANXIETY 02/16/2007  . ERECTILE DYSFUNCTION 02/16/2007  . DEPRESSION 02/16/2007  . VENOUS INSUFFICIENCY 02/16/2007  . FATTY LIVER DISEASE 06/07/2008  . SUBACROMIAL BURSITIS, RIGHT 06/07/2008  . HYPERGLYCEMIA 02/16/2007  . PULMONARY EMBOLISM, HX OF 02/16/2007  . NASH (nonalcoholic steatohepatitis)     Past Surgical History  Procedure Laterality Date  . Drainage fluid from chest  1990  . Leg stripping  Jul 29, 2001    Left     History   Social History  . Marital Status: Married    Spouse Name: N/A    Number of Children: N/A  . Years of Education: N/A   Occupational History  . Landscaping    Social History Main Topics  . Smoking status: Current Every Day Smoker -- 0.50 packs/day for 50 years  . Smokeless tobacco: Not on file  . Alcohol Use: Not on file  . Drug Use: Not on file  . Sexual Activity: Not on file   Other Topics Concern  . Not on file   Social History Narrative   [name] (who pt and wife raised) died of cancer 07/29/2009    Current Outpatient Prescriptions on File Prior to Visit  Medication Sig Dispense Refill  . atorvastatin (LIPITOR) 80 MG tablet TAKE 1 TABLET (80 MG TOTAL) BY MOUTH DAILY. 30 tablet 0  . losartan-hydrochlorothiazide (HYZAAR) 100-25 MG per tablet TAKE 1 TABLET BY MOUTH DAILY. 30 tablet 0   No current facility-administered medications on file prior to visit.    No Known Allergies  Family History  Problem Relation Age of Onset  . Cancer Mother     uncertain type  . Cancer Sister     Lung Disease  . Heart disease Neg Hx     BP 146/98 mmHg  Pulse 73  Temp(Src) 97.8 F (36.6 C) (Oral)  Ht 5' 10"  (1.778 m)  Wt 259 lb  (117.482 kg)  BMI 37.16 kg/m2  SpO2 94%  Review of Systems  Constitutional: Negative for fever.  HENT: Negative for hearing loss.   Eyes: Negative for visual disturbance.  Respiratory: Negative for cough.   Cardiovascular: Negative for chest pain.  Gastrointestinal: Negative for anal bleeding.  Endocrine: Negative for cold intolerance.  Genitourinary: Negative for hematuria and difficulty urinating.  Musculoskeletal: Negative for back pain.  Skin: Negative for rash.  Allergic/Immunologic: Negative for environmental allergies.  Neurological: Negative for syncope, numbness and headaches.  Hematological: Does not bruise/bleed easily.  Psychiatric/Behavioral: Negative for dysphoric mood.      Objective:   Physical Exam VS: see vs page GEN: no distress HEAD: head: no deformity eyes: no periorbital swelling, no proptosis external nose and ears are normal mouth: no lesion seen NECK: supple, thyroid is not enlarged CHEST WALL: no deformity.   BREASTS:  No gynecomastia CV: reg rate and rhythm, no murmur ABD: abdomen is soft, nontender.  no hepatosplenomegaly.  not distended.  no hernia RECTAL: normal external and internal exam.  heme neg. PROSTATE:  Normal size.  No nodule MUSCULOSKELETAL: muscle bulk and strength are grossly normal.  no obvious joint swelling.  gait is normal and steady.   PULSES: dorsalis pedis intact bilat.  no carotid bruit NEURO:  cn 2-12 grossly intact.   readily moves all 4's.  sensation is intact to touch on the feet SKIN:  Normal texture and temperature.  No rash or suspicious lesion is visible.   NODES:  None palpable at the neck PSYCH: alert, well-oriented.  Does not appear anxious nor depressed.   ecg machine is not working.    Assessment & Plan:  Wellness visit today, with problems stable, except as noted. please consider these measures for your health:  minimize alcohol.  do not use tobacco products.  have a colonoscopy at least every 10 years  from age 69.  Women should have an annual mammogram from age 66.  keep firearms safely stored.  always use seat belts.  have working smoke alarms in your home.  see an eye doctor and dentist regularly.  never drive under the influence of alcohol or drugs (including prescription drugs).  those with fair skin should take precautions against the sun. we discussed code status.  pt requests full code, but would not want to be started or maintained on artificial life-support measures if there was not a reasonable chance of recovery.      SEPARATE EVALUATION FOLLOWS--EACH PROBLEM HERE IS NEW, NOT RESPONDING TO TREATMENT, OR POSES SIGNIFICANT RISK TO THE PATIENT'S HEALTH: HISTORY OF THE PRESENT ILLNESS: Pt states a few years of severe toenail fungus on both feet, but no assoc ulcer.   He takes hyzaar as rx'ed. PAST MEDICAL HISTORY reviewed and up to date today REVIEW OF SYSTEMS: He has weight gain and doe PHYSICAL EXAMINATION: VITAL SIGNS:  See vs page. GENERAL: no distress EXTEMITIES: no deformity.  no ulcer on the left foot.  2+ left leg edema (trace on the right).  There is severe bilateral onychomycosis.  The right leg and foot are covered with a stocking.   LUNGS:  Clear to auscultation LABS:  Spirometry is not working today Lab Results  Component Value Date   TSH 5.24* 03/08/2014   Lab Results  Component Value Date   ALT 26 06/03/2012   AST 34 06/03/2012   ALKPHOS 43 06/03/2012   BILITOT 1.1 06/03/2012   IMPRESSION: Onychomycosis, worse COPD, persistent Hypothyroidism, new PLAN:  i have sent a prescription to your pharmacy, for an additional blood-pressure pill.  Check cxr i advised MDI.  Pt declines i have sent a prescription, for synthroid.

## 2014-03-13 MED ORDER — LEVOTHYROXINE SODIUM 25 MCG PO TABS: 25.0000 ug | ORAL_TABLET | Freq: Every day | ORAL | Status: DC

## 2014-03-14 ENCOUNTER — Telehealth: Payer: Self-pay

## 2014-03-14 NOTE — Telephone Encounter (Signed)
Ok, please advise pt lab needs more time

## 2014-03-14 NOTE — Telephone Encounter (Signed)
Pt advised we are waiting on the rest of his lab results. Pt stated that a prescription for levothyroxine 36mg was sent to his pharmacy. Pt states this a new medication for him and wanted to know why it was sent.  Please advise, Thanks!

## 2014-03-14 NOTE — Telephone Encounter (Signed)
please call lab: We need rest of results

## 2014-03-14 NOTE — Telephone Encounter (Signed)
Contacted pt and advised of below. He states that for right now he would like to hold off on the medication for right now.

## 2014-03-14 NOTE — Telephone Encounter (Signed)
You were found to have a mildly underactive thyroid.  As we were waiting for the rest of your results, the prescription got ahead of your being notified.  My apologies.  i should point out that the underactive thyroid is mild, and it would be reasonable to hold-off on the medication for now.

## 2014-03-14 NOTE — Telephone Encounter (Signed)
Contacted lab. Hepatic pick up is later in the day and should be resulted by tomorrow. UA was collected but was not ran. Lab states by accident the specimen could have been thrown out.  Please advise, Thanks!

## 2014-03-14 NOTE — Telephone Encounter (Signed)
Pt called requesting lab results from 03/08/2014. Could you please review and advise pt.  Thanks!

## 2014-03-20 ENCOUNTER — Telehealth: Payer: Self-pay | Admitting: Endocrinology

## 2014-03-20 NOTE — Telephone Encounter (Signed)
See below. Hepatic and Urine labs were not resulted. Lab stated they did not receive.  Please advise, Thanks!

## 2014-03-20 NOTE — Telephone Encounter (Signed)
Pt scheduled to come in for redraw tomorrow.

## 2014-03-20 NOTE — Telephone Encounter (Signed)
Ok, please ask pt to come in for redraw, as there was a lab error.

## 2014-03-20 NOTE — Telephone Encounter (Signed)
Patient would like to have his lab results   Please advise    Thank you

## 2014-03-21 ENCOUNTER — Other Ambulatory Visit: Payer: Self-pay

## 2014-03-21 ENCOUNTER — Other Ambulatory Visit (INDEPENDENT_AMBULATORY_CARE_PROVIDER_SITE_OTHER): Payer: Medicare Other

## 2014-03-21 DIAGNOSIS — Z0000 Encounter for general adult medical examination without abnormal findings: Secondary | ICD-10-CM

## 2014-03-22 LAB — HEPATIC FUNCTION PANEL (6)
ALT: 43 IU/L (ref 0–44)
AST: 53 IU/L — AB (ref 0–40)
Albumin: 4.2 g/dL (ref 3.6–4.8)
Alkaline Phosphatase: 52 IU/L (ref 39–117)
BILIRUBIN TOTAL: 0.6 mg/dL (ref 0.0–1.2)
Bilirubin, Direct: 0.19 mg/dL (ref 0.00–0.40)

## 2014-03-22 LAB — URINALYSIS, ROUTINE W REFLEX MICROSCOPIC
BILIRUBIN URINE: NEGATIVE
Hgb urine dipstick: NEGATIVE
Ketones, ur: NEGATIVE
LEUKOCYTES UA: NEGATIVE
Nitrite: NEGATIVE
SPECIFIC GRAVITY, URINE: 1.02 (ref 1.000–1.030)
UROBILINOGEN UA: 0.2 (ref 0.0–1.0)
Urine Glucose: NEGATIVE
pH: 6 (ref 5.0–8.0)

## 2014-03-26 ENCOUNTER — Ambulatory Visit: Payer: Medicare Other

## 2014-03-26 VITALS — BP 136/88

## 2014-03-26 DIAGNOSIS — I10 Essential (primary) hypertension: Secondary | ICD-10-CM

## 2014-03-30 ENCOUNTER — Other Ambulatory Visit: Payer: Self-pay | Admitting: Endocrinology

## 2014-04-03 ENCOUNTER — Other Ambulatory Visit: Payer: Self-pay | Admitting: Endocrinology

## 2014-05-14 ENCOUNTER — Encounter: Payer: Self-pay | Admitting: Endocrinology

## 2014-08-03 ENCOUNTER — Other Ambulatory Visit: Payer: Self-pay | Admitting: Endocrinology

## 2014-10-27 ENCOUNTER — Encounter: Payer: Self-pay | Admitting: Family Medicine

## 2014-10-27 ENCOUNTER — Ambulatory Visit (INDEPENDENT_AMBULATORY_CARE_PROVIDER_SITE_OTHER): Payer: Medicare Other | Admitting: Family Medicine

## 2014-10-27 VITALS — BP 150/100 | HR 79 | Temp 98.6°F | Ht 70.0 in | Wt 248.5 lb

## 2014-10-27 DIAGNOSIS — R197 Diarrhea, unspecified: Secondary | ICD-10-CM

## 2014-10-27 DIAGNOSIS — K921 Melena: Secondary | ICD-10-CM | POA: Diagnosis not present

## 2014-10-27 DIAGNOSIS — K648 Other hemorrhoids: Secondary | ICD-10-CM | POA: Diagnosis not present

## 2014-10-27 DIAGNOSIS — I10 Essential (primary) hypertension: Secondary | ICD-10-CM | POA: Diagnosis not present

## 2014-10-27 MED ORDER — CARVEDILOL 6.25 MG PO TABS: 6.2500 mg | ORAL_TABLET | Freq: Two times a day (BID) | ORAL | Status: DC

## 2014-10-27 NOTE — Progress Notes (Signed)
   Subjective:    Patient ID: Kurt Blair, male    DOB: 1946-03-05, 69 y.o.   MRN: [id number]  HPI  69 year old male  pt with history of fatty liver disease, alcohol abuse ( 12 oz beer and 6-8 oz every night)   and anxiety presents with diarrhea starting 6 days ago. Noted after lunch 6 days (6/13) ago mild low abd ache, then bright red loose stool x3. 1/2- 1 cup each time of diluted blood. no pain with BM, no rectal Pain .  He felt well until next day he felt lightheaded. He did not change anything.  No further stool and feeling back to normal self on  6/15.    He had prior been having some low abd ache not pain off and on that he assocaited with weed eating for a while.  Changed to coreg form losartanHCTZ in 03/2015. Not working as well with BP.  BP Readings from Last 3 Encounters:  10/27/14 150/100  03/26/14 136/88  03/12/14 146/98     No history of stomach issues. He has had hemorrhoids in past. Never had blood in stool in past.  He is not on blood thinner. Colonoscopy: 2003, nml per pt, no record in chart  Family history: no family history of colon cancer.     Review of Systems  Constitutional: Negative for fever and fatigue.  HENT: Negative for ear pain.   Eyes: Negative for pain.  Respiratory: Negative for cough and shortness of breath.   Cardiovascular: Negative for chest pain.  Gastrointestinal: Negative for abdominal distention.       Objective:   Physical Exam  Constitutional: Vital signs are normal. He appears well-developed and well-nourished.  Obese  In NAD  HENT:  Head: Normocephalic.  Right Ear: Hearing normal.  Left Ear: Hearing normal.  Nose: Nose normal.  Mouth/Throat: Oropharynx is clear and moist and mucous membranes are normal.  Neck: Trachea normal. Carotid bruit is not present. No thyroid mass and no thyromegaly present.  Cardiovascular: Normal rate, regular rhythm and normal pulses.  Exam reveals no gallop, no distant heart sounds and no  friction rub.   No murmur heard. No peripheral edema  Pulmonary/Chest: Effort normal and breath sounds normal. No respiratory distress.  Abdominal: Soft. Normal appearance. There is no tenderness. There is no rebound and no CVA tenderness. No hernia.  Genitourinary: Rectal exam shows internal hemorrhoid. Rectal exam shows no external hemorrhoid, no fissure, no mass, no tenderness and anal tone normal. Guaiac positive stool.  Skin: Skin is warm, dry and intact. No rash noted.  Psychiatric: He has a normal mood and affect. His speech is normal and behavior is normal. Thought content normal.          Assessment & Plan:   Hematchezia from low GI bleed. Given oozing hemorhoid seen in rectum. This is most likely cause of bleeding.  Increase fiber and water in diet to avoid constipation.  Pt is overdue for colonscopy screen.. So this was recommended to be set up with PCP.   HTN, poor control: Increase coreg to 6.25 mg BID. Follow BPs at home and follow up with PCP in 1-2 weeks.

## 2014-10-27 NOTE — Progress Notes (Signed)
Pre visit review using our clinic review tool, if applicable. No additional management support is needed unless otherwise documented below in the visit note.Marland Kitchenlbpc

## 2014-10-27 NOTE — Patient Instructions (Addendum)
Increase coreg to 6.25 mg twice day. Get BP cuff. Follow BP at home. Follow up with Dr. Loanne Drilling in 1-2 weeks.  Increase fiber and water in diet.

## 2014-10-29 ENCOUNTER — Other Ambulatory Visit: Payer: Self-pay | Admitting: Endocrinology

## 2014-11-13 ENCOUNTER — Encounter: Payer: Self-pay | Admitting: Endocrinology

## 2014-11-13 ENCOUNTER — Ambulatory Visit (INDEPENDENT_AMBULATORY_CARE_PROVIDER_SITE_OTHER): Payer: Medicare Other | Admitting: Endocrinology

## 2014-11-13 VITALS — BP 145/90 | HR 72 | Temp 98.0°F | Ht 70.0 in | Wt 251.0 lb

## 2014-11-13 DIAGNOSIS — I10 Essential (primary) hypertension: Secondary | ICD-10-CM

## 2014-11-13 DIAGNOSIS — Z0000 Encounter for general adult medical examination without abnormal findings: Secondary | ICD-10-CM

## 2014-11-13 DIAGNOSIS — E039 Hypothyroidism, unspecified: Secondary | ICD-10-CM | POA: Diagnosis not present

## 2014-11-13 DIAGNOSIS — R739 Hyperglycemia, unspecified: Secondary | ICD-10-CM

## 2014-11-13 LAB — HEMOGLOBIN A1C: Hgb A1c MFr Bld: 4.9 % (ref 4.6–6.5)

## 2014-11-13 LAB — TSH: TSH: 3.88 u[IU]/mL (ref 0.35–4.50)

## 2014-11-13 MED ORDER — LOSARTAN POTASSIUM 50 MG PO TABS: 50.0000 mg | ORAL_TABLET | Freq: Every day | ORAL | Status: DC

## 2014-11-13 NOTE — Patient Instructions (Addendum)
i have sent a prescription to your pharmacy, for an additional blood pressure pill. blood tests are requested for you today.  We'll let you know about the results. Please come back for a regular physical appointment in 6 months.  please consider these measures for your health:  minimize alcohol.  do not use tobacco products.  have a colonoscopy at least every 10 years from age 69.  keep firearms safely stored.  always use seat belts.  have working smoke alarms in your home.  see an eye doctor and dentist regularly.  never drive under the influence of alcohol or drugs (including prescription drugs).   it is critically important to prevent falling down (keep floor areas well-lit, dry, and free of loose objects.  If you have a cane, walker, or wheelchair, you should use it, even for short trips around the house.  Also, try not to rush).

## 2014-11-13 NOTE — Progress Notes (Signed)
Subjective:    Patient ID: Kurt Blair, male    DOB: [birth year], 69 y.o.   MRN: [id number]  HPI The state of at least three ongoing medical problems is addressed today, with interval history of each noted here: HTN: pt says he never misses the coreg.  Denies sob Hyperglycemia: he denies weight change. Hypothyroidism: he denies edema Past Medical History  Diagnosis Date  . 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 ANXIETY 02/16/2007  . ERECTILE DYSFUNCTION 02/16/2007  . DEPRESSION 02/16/2007  . VENOUS INSUFFICIENCY 02/16/2007  . FATTY LIVER DISEASE 06/07/2008  . SUBACROMIAL BURSITIS, RIGHT 06/07/2008  . HYPERGLYCEMIA 02/16/2007  . PULMONARY EMBOLISM, HX OF 02/16/2007  . NASH (nonalcoholic steatohepatitis)     Past Surgical History  Procedure Laterality Date  . Drainage fluid from chest  1990  . Leg stripping  23-Jul-2001    Left     History   Social History  . Marital Status: Married    Spouse Name: N/A  . Number of Children: N/A  . Years of Education: N/A   Occupational History  . Landscaping    Social History Main Topics  . Smoking status: Current Every Day Smoker -- 0.50 packs/day for 50 years  . Smokeless tobacco: Not on file  . Alcohol Use: Not on file  . Drug Use: Not on file  . Sexual Activity: Not on file   Other Topics Concern  . Not on file   Social History Narrative   [name] (who pt and wife raised) died of cancer Jul 23, 2009    No Known Allergies  Family History  Problem Relation Age of Onset  . Cancer Mother     uncertain type  . Cancer Sister     Lung Disease  . Heart disease Neg Hx     BP 145/90 mmHg  Pulse 72  Temp(Src) 98 F (36.7 C) (Oral)  Ht 5' 10"  (1.778 m)  Wt 251 lb (113.853 kg)  BMI 36.01 kg/m2  SpO2 96%     Review of Systems Denies chest pain and constipation    Objective:   Physical Exam VITAL SIGNS:  See vs page GENERAL: no distress PSYCH: Alert and well-oriented.  Does not appear anxious nor depressed.       Lab  Results  Component Value Date   HGBA1C 4.9 11/13/2014   Lab Results  Component Value Date   TSH 3.88 11/13/2014      Assessment & Plan:  HTN: he needs increased rx Hyperglycemia: stable--we'll follow Hypothyroidism: improved--no rx is needed now--we'll follow    Patient is advised the following: Patient Instructions  i have sent a prescription to your pharmacy, for an additional blood pressure pill. blood tests are requested for you today.  We'll let you know about the results. Please come back for a regular physical appointment in 6 months.  please consider these measures for your health:  minimize alcohol.  do not use tobacco products.  have a colonoscopy at least every 10 years from age 10.  keep firearms safely stored.  always use seat belts.  have working smoke alarms in your home.  see an eye doctor and dentist regularly.  never drive under the influence of alcohol or drugs (including prescription drugs).   it is critically important to prevent falling down (keep floor areas well-lit, dry, and free of loose objects.  If you have a cane, walker, or wheelchair, you should use it, even for short trips around the house.  Also, try not  to rush).       Subjective:   Patient here for Medicare annual wellness visit and management of other chronic and acute problems.     Risk factors: advanced age    41 of Physicians Providing Medical Care to Patient:  See "snapshot"   Activities of Daily Living: In your present state of health, do you have any difficulty performing the following activities?:  Preparing food and eating?: No  Bathing yourself: No  Getting dressed: No  Using the toilet:No  Moving around from place to place: No  In the past year have you fallen or had a near fall?:No    Home Safety: Has smoke detector and wears seat belts. firearms are safely stored.  Diet and Exercise  Current exercise habits: pt says good Dietary issues discussed: pt reports a healthy  diet   Depression Screen  Q1: Over the past two weeks, have you felt down, depressed or hopeless? no  Q2: Over the past two weeks, have you felt little interest or pleasure in doing things? no   The following portions of the patient's history were reviewed and updated as appropriate: allergies, current medications, past family history, past medical history, past social history, past surgical history and problem list.   Review of Systems  Denies hearing loss, and visual loss Objective:   Vision:  Sees opthalmologist Hearing: grossly normal Body mass index:  See vs page Msk: pt easily and quickly performs "get-up-and-go" from a sitting position Cognitive Impairment Assessment: cognition, memory and judgment appear normal.  remembers 2/3 at 5 minutes (? Effort).  excellent recall.  can easily read and write a sentence.  alert and oriented x 3.     Assessment:   Medicare wellness utd on preventive parameters.     Plan:   During the course of the visit the patient was educated and counseled about appropriate screening and preventive services including:        Fall prevention   Diabetes screening  Nutrition counseling   Vaccines / LABS Zostavax / Pneumococcal Vaccine  today  PSA  Patient Instructions (the written plan) was given to the patient.

## 2014-11-13 NOTE — Progress Notes (Signed)
we discussed code status.  pt requests full code, but would not want to be started or maintained on artificial life-support measures if there was not a reasonable chance of recovery 

## 2014-11-15 NOTE — Progress Notes (Signed)
   Subjective:    Patient ID: Kurt Blair, male    DOB: [birth year], 69 y.o.   MRN: [id number]  HPI    Review of Systems     Objective:   Physical Exam    i personally reviewed electrocardiogram tracing: Nonspecific T-abnormality--no signif change since 2014    Assessment & Plan:

## 2014-12-28 ENCOUNTER — Other Ambulatory Visit: Payer: Self-pay | Admitting: Family Medicine

## 2015-01-24 ENCOUNTER — Other Ambulatory Visit: Payer: Self-pay | Admitting: Endocrinology

## 2015-02-27 ENCOUNTER — Other Ambulatory Visit: Payer: Self-pay | Admitting: Endocrinology

## 2015-03-28 ENCOUNTER — Ambulatory Visit (INDEPENDENT_AMBULATORY_CARE_PROVIDER_SITE_OTHER): Payer: Medicare Other | Admitting: Endocrinology

## 2015-03-28 ENCOUNTER — Encounter: Payer: Self-pay | Admitting: Endocrinology

## 2015-03-28 VITALS — BP 174/104 | HR 69 | Temp 97.7°F | Ht 70.0 in | Wt 258.0 lb

## 2015-03-28 DIAGNOSIS — Z125 Encounter for screening for malignant neoplasm of prostate: Secondary | ICD-10-CM | POA: Diagnosis not present

## 2015-03-28 DIAGNOSIS — K7581 Nonalcoholic steatohepatitis (NASH): Secondary | ICD-10-CM | POA: Diagnosis not present

## 2015-03-28 DIAGNOSIS — E039 Hypothyroidism, unspecified: Secondary | ICD-10-CM

## 2015-03-28 DIAGNOSIS — D72819 Decreased white blood cell count, unspecified: Secondary | ICD-10-CM | POA: Diagnosis not present

## 2015-03-28 DIAGNOSIS — K625 Hemorrhage of anus and rectum: Secondary | ICD-10-CM | POA: Diagnosis not present

## 2015-03-28 DIAGNOSIS — R739 Hyperglycemia, unspecified: Secondary | ICD-10-CM | POA: Diagnosis not present

## 2015-03-28 LAB — HEPATIC FUNCTION PANEL
ALT: 17 U/L (ref 0–53)
AST: 26 U/L (ref 0–37)
Albumin: 4.3 g/dL (ref 3.5–5.2)
Alkaline Phosphatase: 57 U/L (ref 39–117)
BILIRUBIN TOTAL: 1.4 mg/dL — AB (ref 0.2–1.2)
Bilirubin, Direct: 0.3 mg/dL (ref 0.0–0.3)
Total Protein: 7.2 g/dL (ref 6.0–8.3)

## 2015-03-28 LAB — CBC WITH DIFFERENTIAL/PLATELET
Basophils Absolute: 0 10*3/uL (ref 0.0–0.1)
Basophils Relative: 0.5 % (ref 0.0–3.0)
EOS ABS: 0.4 10*3/uL (ref 0.0–0.7)
EOS PCT: 6.7 % — AB (ref 0.0–5.0)
HEMATOCRIT: 45.4 % (ref 39.0–52.0)
HEMOGLOBIN: 15.2 g/dL (ref 13.0–17.0)
LYMPHS ABS: 2.1 10*3/uL (ref 0.7–4.0)
Lymphocytes Relative: 36.4 % (ref 12.0–46.0)
MCHC: 33.4 g/dL (ref 30.0–36.0)
MCV: 81.9 fl (ref 78.0–100.0)
Monocytes Absolute: 0.6 10*3/uL (ref 0.1–1.0)
Monocytes Relative: 10.6 % (ref 3.0–12.0)
NEUTROS ABS: 2.6 10*3/uL (ref 1.4–7.7)
Neutrophils Relative %: 45.8 % (ref 43.0–77.0)
PLATELETS: 225 10*3/uL (ref 150.0–400.0)
RBC: 5.55 Mil/uL (ref 4.22–5.81)
RDW: 18.1 % — ABNORMAL HIGH (ref 11.5–15.5)
WBC: 5.8 10*3/uL (ref 4.0–10.5)

## 2015-03-28 LAB — BASIC METABOLIC PANEL
BUN: 12 mg/dL (ref 6–23)
CHLORIDE: 102 meq/L (ref 96–112)
CO2: 29 mEq/L (ref 19–32)
Calcium: 9.5 mg/dL (ref 8.4–10.5)
Creatinine, Ser: 0.78 mg/dL (ref 0.40–1.50)
GFR: 126.98 mL/min (ref >60.00)
GLUCOSE: 96 mg/dL (ref 70–99)
POTASSIUM: 3.7 meq/L (ref 3.5–5.1)
Sodium: 139 mEq/L (ref 135–145)

## 2015-03-28 LAB — TSH: TSH: 3.12 u[IU]/mL (ref 0.35–4.50)

## 2015-03-28 LAB — LIPID PANEL
CHOLESTEROL: 128 mg/dL (ref 0–200)
HDL: 44.1 mg/dL (ref >39.00)
LDL Cholesterol: 64 mg/dL (ref 0–99)
NonHDL: 83.61
Total CHOL/HDL Ratio: 3
Triglycerides: 100 mg/dL (ref 0.0–149.0)
VLDL: 20 mg/dL (ref 0.0–40.0)

## 2015-03-28 LAB — HEMOGLOBIN A1C: Hgb A1c MFr Bld: 6 % (ref 4.6–6.5)

## 2015-03-28 LAB — IBC PANEL
Iron: 111 ug/dL (ref 42–165)
Saturation Ratios: 26 % (ref 20.0–50.0)
Transferrin: 305 mg/dL (ref 212.0–360.0)

## 2015-03-28 LAB — PSA: PSA: 1.06 ng/mL (ref 0.10–4.00)

## 2015-03-28 MED ORDER — ATORVASTATIN CALCIUM 80 MG PO TABS: 80.0000 mg | ORAL_TABLET | Freq: Every day | ORAL | Status: DC

## 2015-03-28 MED ORDER — LOSARTAN POTASSIUM-HCTZ 100-12.5 MG PO TABS: 1.0000 | ORAL_TABLET | Freq: Every day | ORAL | Status: DC

## 2015-03-28 NOTE — Patient Instructions (Addendum)
Please see a specialist for your symptoms.  you will receive a phone call, about a day and time for an appointment. blood tests are requested for you today.  We'll let you know about the results.  i have sent a prescription to your pharmacy, to increase 1 of your blood pressure pills. Please come back for a follow-up appointment in 2-3 weeks.

## 2015-03-28 NOTE — Progress Notes (Signed)
   Subjective:    Patient ID: Kurt Blair, male    DOB: 08-Jun-1945, 69 y.o.   MRN: [id number]  HPI Pt states 4 days of intermittent slight pain across the lower abdomen, with slight assoc diarrhea and BRBPR.  He says he never misses the BP meds. Past Medical History  Diagnosis Date  . 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 ANXIETY 02/16/2007  . ERECTILE DYSFUNCTION 02/16/2007  . DEPRESSION 02/16/2007  . VENOUS INSUFFICIENCY 02/16/2007  . FATTY LIVER DISEASE 06/07/2008  . SUBACROMIAL BURSITIS, RIGHT 06/07/2008  . HYPERGLYCEMIA 02/16/2007  . PULMONARY EMBOLISM, HX OF 02/16/2007  . NASH (nonalcoholic steatohepatitis)     Past Surgical History  Procedure Laterality Date  . Drainage fluid from chest  1990  . Leg stripping  07/27/01    Left     Social History   Social History  . Marital Status: Married    Spouse Name: N/A  . Number of Children: N/A  . Years of Education: N/A   Occupational History  . Landscaping    Social History Main Topics  . Smoking status: Current Every Day Smoker -- 0.50 packs/day for 50 years  . Smokeless tobacco: Not on file  . Alcohol Use: Not on file  . Drug Use: Not on file  . Sexual Activity: Not on file   Other Topics Concern  . Not on file   Social History Narrative   [name] (who pt and wife raised) died of cancer 2009-07-27    Current Outpatient Prescriptions on File Prior to Visit  Medication Sig Dispense Refill  . carvedilol (COREG) 6.25 MG tablet TAKE 1 TABLET BY MOUTH TWICE A DAY WITH A MEAL 60 tablet 1   No current facility-administered medications on file prior to visit.    No Known Allergies  Family History  Problem Relation Age of Onset  . Cancer Mother     uncertain type  . Cancer Sister     Lung Disease  . Heart disease Neg Hx     BP 174/104 mmHg  Pulse 69  Temp(Src) 97.7 F (36.5 C) (Oral)  Ht 5' 10"  (1.778 m)  Wt 258 lb (117.028 kg)  BMI 37.02 kg/m2  SpO2 94%    Review of Systems Denies hematuria, dizziness,  and LOC    Objective:   Physical Exam VITAL SIGNS:  See vs page GENERAL: no distress. ABDOMEN: abdomen is soft, nontender.  no hepatosplenomegaly.  not distended.  no hernia Ext: 1+ bilat leg edema.      Assessment & Plan:  abd pain, recurrent, uncertain etiology BRBPR: recurrent, uncertain etiology HTN: worse.  Patient is advised the following: Patient Instructions  Please see a specialist for your symptoms.  you will receive a phone call, about a day and time for an appointment. blood tests are requested for you today.  We'll let you know about the results.  i have sent a prescription to your pharmacy, to increase 1 of your blood pressure pills. Please come back for a follow-up appointment in 2-3 weeks.

## 2015-04-10 ENCOUNTER — Encounter: Payer: Self-pay | Admitting: Gastroenterology

## 2015-04-12 ENCOUNTER — Ambulatory Visit (INDEPENDENT_AMBULATORY_CARE_PROVIDER_SITE_OTHER): Payer: Medicare Other | Admitting: Endocrinology

## 2015-04-12 ENCOUNTER — Encounter: Payer: Self-pay | Admitting: Endocrinology

## 2015-04-12 VITALS — BP 170/98 | HR 67 | Temp 97.7°F | Ht 70.0 in | Wt 254.0 lb

## 2015-04-12 DIAGNOSIS — I10 Essential (primary) hypertension: Secondary | ICD-10-CM | POA: Diagnosis not present

## 2015-04-12 MED ORDER — LOSARTAN POTASSIUM-HCTZ 100-25 MG PO TABS: 1.0000 | ORAL_TABLET | Freq: Every day | ORAL | Status: DC

## 2015-04-12 MED ORDER — POTASSIUM CHLORIDE ER 10 MEQ PO TBCR: 10.0000 meq | EXTENDED_RELEASE_TABLET | Freq: Every day | ORAL | Status: DC

## 2015-04-12 NOTE — Patient Instructions (Addendum)
i have sent a prescription to your pharmacy, to again increase 1 of your blood pressure pills. i have also sent a prescription to your pharmacy, for a potassium pill. Please come back for a follow-up appointment in 2 weeks.

## 2015-04-12 NOTE — Progress Notes (Signed)
   Subjective:    Patient ID: Kurt Blair, male    DOB: [birth year], 69 y.o.   MRN: [id number]  HPI Pt is here to f/u HTN.  he takes meds as rx'ed.  pt states he feels well in general.   Past Medical History  Diagnosis Date  . 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 ANXIETY 02/16/2007  . ERECTILE DYSFUNCTION 02/16/2007  . DEPRESSION 02/16/2007  . VENOUS INSUFFICIENCY 02/16/2007  . FATTY LIVER DISEASE 06/07/2008  . SUBACROMIAL BURSITIS, RIGHT 06/07/2008  . HYPERGLYCEMIA 02/16/2007  . PULMONARY EMBOLISM, HX OF 02/16/2007  . NASH (nonalcoholic steatohepatitis)     Past Surgical History  Procedure Laterality Date  . Drainage fluid from chest  1990  . Leg stripping  07-26-2001    Left     Social History   Social History  . Marital Status: Married    Spouse Name: N/A  . Number of Children: N/A  . Years of Education: N/A   Occupational History  . Landscaping    Social History Main Topics  . Smoking status: Current Every Day Smoker -- 0.50 packs/day for 50 years  . Smokeless tobacco: Not on file  . Alcohol Use: Not on file  . Drug Use: Not on file  . Sexual Activity: Not on file   Other Topics Concern  . Not on file   Social History Narrative   [name] (who pt and wife raised) died of cancer 07-26-09    Current Outpatient Prescriptions on File Prior to Visit  Medication Sig Dispense Refill  . atorvastatin (LIPITOR) 80 MG tablet Take 1 tablet (80 mg total) by mouth daily. 90 tablet 3  . carvedilol (COREG) 6.25 MG tablet TAKE 1 TABLET BY MOUTH TWICE A DAY WITH A MEAL 60 tablet 1   No current facility-administered medications on file prior to visit.    No Known Allergies  Family History  Problem Relation Age of Onset  . Cancer Mother     uncertain type  . Cancer Sister     Lung Disease  . Heart disease Neg Hx     BP 170/98 mmHg  Pulse 67  Temp(Src) 97.7 F (36.5 C) (Oral)  Ht 5' 10"  (1.778 m)  Wt 254 lb (115.214 kg)  BMI 36.45 kg/m2  SpO2 97%   Review of  Systems Denies sob    Objective:   Physical Exam VITAL SIGNS:  See vs page. GENERAL: no distress. Ext: 1+ bilat leg edema.  Lab Results  Component Value Date   CREATININE 0.78 03/28/2015   BUN 12 03/28/2015   NA 139 03/28/2015   K 3.7 03/28/2015   CL 102 03/28/2015   CO2 29 03/28/2015      Assessment & Plan:  HTN: he needs increased rx  Patient is advised the following: Patient Instructions  i have sent a prescription to your pharmacy, to again increase 1 of your blood pressure pills. i have also sent a prescription to your pharmacy, for a potassium pill. Please come back for a follow-up appointment in 2 weeks.

## 2015-04-14 DIAGNOSIS — I10 Essential (primary) hypertension: Secondary | ICD-10-CM | POA: Insufficient documentation

## 2015-04-22 ENCOUNTER — Other Ambulatory Visit: Payer: Self-pay | Admitting: Endocrinology

## 2015-04-26 ENCOUNTER — Encounter: Payer: Self-pay | Admitting: Endocrinology

## 2015-04-26 ENCOUNTER — Ambulatory Visit (INDEPENDENT_AMBULATORY_CARE_PROVIDER_SITE_OTHER): Payer: Medicare Other | Admitting: Endocrinology

## 2015-04-26 VITALS — BP 152/101 | HR 64 | Temp 97.4°F | Ht 70.0 in | Wt 254.0 lb

## 2015-04-26 DIAGNOSIS — E876 Hypokalemia: Secondary | ICD-10-CM | POA: Insufficient documentation

## 2015-04-26 LAB — BASIC METABOLIC PANEL
BUN: 10 mg/dL (ref 6–23)
CALCIUM: 9.4 mg/dL (ref 8.4–10.5)
CHLORIDE: 101 meq/L (ref 96–112)
CO2: 32 mEq/L (ref 19–32)
CREATININE: 0.87 mg/dL (ref 0.40–1.50)
GFR: 111.92 mL/min (ref >60.00)
Glucose, Bld: 100 mg/dL — ABNORMAL HIGH (ref 70–99)
Potassium: 3.6 mEq/L (ref 3.5–5.1)
Sodium: 139 mEq/L (ref 135–145)

## 2015-04-26 MED ORDER — POTASSIUM CHLORIDE ER 8 MEQ PO CPCR: 16.0000 meq | ORAL_CAPSULE | Freq: Every day | ORAL | Status: DC

## 2015-04-26 MED ORDER — FUROSEMIDE 20 MG PO TABS: 20.0000 mg | ORAL_TABLET | Freq: Every day | ORAL | Status: DC
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
## 2015-04-26 NOTE — Patient Instructions (Addendum)
i have sent a prescription to your pharmacy, to add another fluid pill.  i have also sent a prescription to your pharmacy, to change the potassium pill to a different type.  blood tests are requested for you today.  We'll let you know about the results. Please come back next month as scheduled.

## 2015-05-14 ENCOUNTER — Other Ambulatory Visit: Payer: Self-pay | Admitting: Endocrinology

## 2015-05-14 ENCOUNTER — Other Ambulatory Visit (INDEPENDENT_AMBULATORY_CARE_PROVIDER_SITE_OTHER): Payer: Medicare Other

## 2015-05-14 DIAGNOSIS — E876 Hypokalemia: Secondary | ICD-10-CM

## 2015-05-14 LAB — BASIC METABOLIC PANEL
BUN: 12 mg/dL (ref 6–23)
CO2: 30 mEq/L (ref 19–32)
Calcium: 9.1 mg/dL (ref 8.4–10.5)
Chloride: 101 mEq/L (ref 96–112)
Creatinine, Ser: 0.78 mg/dL (ref 0.40–1.50)
GFR: 126.93 mL/min (ref >60.00)
GLUCOSE: 121 mg/dL — AB (ref 70–99)
POTASSIUM: 3.2 meq/L — AB (ref 3.5–5.1)
Sodium: 139 mEq/L (ref 135–145)

## 2015-05-17 ENCOUNTER — Encounter: Payer: Self-pay | Admitting: Endocrinology

## 2015-05-17 ENCOUNTER — Ambulatory Visit (INDEPENDENT_AMBULATORY_CARE_PROVIDER_SITE_OTHER): Payer: Medicare Other | Admitting: Endocrinology

## 2015-05-17 VITALS — BP 158/98 | HR 66 | Temp 97.5°F | Ht 70.0 in | Wt 258.0 lb

## 2015-05-17 DIAGNOSIS — R609 Edema, unspecified: Secondary | ICD-10-CM | POA: Diagnosis not present

## 2015-05-17 DIAGNOSIS — Z0000 Encounter for general adult medical examination without abnormal findings: Secondary | ICD-10-CM | POA: Diagnosis not present

## 2015-05-17 DIAGNOSIS — I10 Essential (primary) hypertension: Secondary | ICD-10-CM | POA: Diagnosis not present

## 2015-05-17 DIAGNOSIS — E876 Hypokalemia: Secondary | ICD-10-CM

## 2015-05-17 MED ORDER — FUROSEMIDE 40 MG PO TABS: 40.0000 mg | ORAL_TABLET | Freq: Every day | ORAL | Status: DC

## 2015-05-17 MED ORDER — POTASSIUM CHLORIDE 40 MEQ/15ML (20%) PO SOLN: 40.0000 meq | Freq: Every day | ORAL | Status: DC

## 2015-05-17 NOTE — Progress Notes (Signed)
Subjective:    Patient ID: Kurt Blair, male    DOB: 24-Apr-1946, 70 y.o.   MRN: [id number]  HPI Pt is here for regular wellness examination, and is feeling pretty well in general, and says chronic med probs are stable, except as noted below Past Medical History  Diagnosis Date  . 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 ANXIETY 02/16/2007  . ERECTILE DYSFUNCTION 02/16/2007  . DEPRESSION 02/16/2007  . VENOUS INSUFFICIENCY 02/16/2007  . FATTY LIVER DISEASE 06/07/2008  . SUBACROMIAL BURSITIS, RIGHT 06/07/2008  . HYPERGLYCEMIA 02/16/2007  . PULMONARY EMBOLISM, HX OF 02/16/2007  . NASH (nonalcoholic steatohepatitis)     Past Surgical History  Procedure Laterality Date  . Drainage fluid from chest  1990  . Leg stripping  July 20, 2001    Left     Social History   Social History  . Marital Status: Married    Spouse Name: N/A  . Number of Children: N/A  . Years of Education: N/A   Occupational History  . Landscaping    Social History Main Topics  . Smoking status: Current Every Day Smoker -- 0.50 packs/day for 50 years  . Smokeless tobacco: Not on file  . Alcohol Use: Not on file  . Drug Use: Not on file  . Sexual Activity: Not on file   Other Topics Concern  . Not on file   Social History Narrative   [name] (who pt and wife raised) died of cancer 07/20/09    Current Outpatient Prescriptions on File Prior to Visit  Medication Sig Dispense Refill  . atorvastatin (LIPITOR) 80 MG tablet Take 1 tablet (80 mg total) by mouth daily. 90 tablet 3  . carvedilol (COREG) 6.25 MG tablet TAKE 1 TABLET BY MOUTH TWICE A DAY WITH A MEAL 60 tablet 1  . losartan-hydrochlorothiazide (HYZAAR) 100-25 MG tablet Take 1 tablet by mouth daily. 30 tablet 5   No current facility-administered medications on file prior to visit.    No Known Allergies  Family History  Problem Relation Age of Onset  . Cancer Mother     uncertain type  . Cancer Sister     Lung Disease  . Heart disease Neg Hx      BP 158/98 mmHg  Pulse 66  Temp(Src) 97.5 F (36.4 C) (Oral)  Ht 5' 10"  (1.778 m)  Wt 258 lb (117.028 kg)  BMI 37.02 kg/m2  SpO2 94%  Review of Systems  Constitutional: Negative for fever.  HENT: Negative for hearing loss.   Eyes: Negative for visual disturbance.  Respiratory: Negative for cough.   Cardiovascular: Negative for chest pain.  Gastrointestinal: Negative for blood in stool.  Endocrine: Negative for cold intolerance.  Genitourinary: Negative for hematuria.  Musculoskeletal: Negative for back pain.  Skin: Negative for rash.  Allergic/Immunologic: Negative for environmental allergies.  Neurological: Negative for numbness.  Hematological: Does not bruise/bleed easily.  Psychiatric/Behavioral: Negative for dysphoric mood.       Objective:   Physical Exam VS: see vs page GEN: no distress HEAD: head: no deformity eyes: no periorbital swelling, no proptosis external nose and ears are normal mouth: no lesion seen NECK: supple, thyroid is not enlarged CHEST WALL: no deformity LUNGS:  Clear to auscultation CV: reg rate and rhythm, no murmur ABD: abdomen is soft, nontender.  no hepatosplenomegaly.  not distended.  no hernia RECTAL: normal external and internal exam.  heme neg MUSCULOSKELETAL: muscle bulk and strength are grossly normal.  no obvious joint swelling.  gait is normal and  steady EXTEMITIES: no deformity.  no ulcer on the feet.  feet are of normal color and temp.   There is bilateral onychomycosis of the toenails. PULSES: dorsalis pedis intact bilat.  no carotid bruit. NEURO:  cn 2-12 grossly intact.   readily moves all 4's.  sensation is intact to touch on the feet SKIN:  Normal texture and temperature.  No rash or suspicious lesion is visible.   NODES:  None palpable at the neck PSYCH: alert, well-oriented.  Does not appear anxious nor depressed.        Assessment & Plan:  Wellness visit today, with problems stable, except as noted.      SEPARATE EVALUATION FOLLOWS--EACH PROBLEM HERE IS NEW, NOT RESPONDING TO TREATMENT, OR POSES SIGNIFICANT RISK TO THE PATIENT'S HEALTH: HISTORY OF THE PRESENT ILLNESS: Edema: pt says slightly improved HTN: he takes meds as rx'ed.  Denies sob Hypokalemia: denies cramps, but he wants cheaper form PAST MEDICAL HISTORY reviewed and up to date today REVIEW OF SYSTEMS: He has weight gain weight gain.  He has headache. PHYSICAL EXAMINATION: VITAL SIGNS:  See vs page GENERAL: no distress CV: 1+ bilat leg edema, and varicosities.  LAB/XRAY RESULTS: Lab Results  Component Value Date   CREATININE 0.78 05/14/2015   BUN 12 05/14/2015   NA 139 05/14/2015   K 3.2* 05/14/2015   CL 101 05/14/2015   CO2 30 05/14/2015  IMPRESSION: Edema: he needs increased rx HTN: he needs increased rx Hypokalemia: he needs increased rx PLAN: See instruction page

## 2015-05-17 NOTE — Patient Instructions (Addendum)
i have sent prescriptions to your pharmacy, to increase the furosemide and potassium.   Please come back for a follow-up appointment in 1 month.   please consider these measures for your health:  minimize alcohol.  do not use tobacco products.  have a colonoscopy at least every 10 years from age 70.  keep firearms safely stored.  always use seat belts.  have working smoke alarms in your home.  see an eye doctor and dentist regularly.  never drive under the influence of alcohol or drugs (including prescription drugs).

## 2015-06-17 ENCOUNTER — Ambulatory Visit (INDEPENDENT_AMBULATORY_CARE_PROVIDER_SITE_OTHER): Payer: Medicare Other | Admitting: Endocrinology

## 2015-06-17 ENCOUNTER — Encounter: Payer: Self-pay | Admitting: Endocrinology

## 2015-06-17 VITALS — BP 144/88 | HR 65 | Temp 97.7°F | Ht 70.0 in | Wt 258.0 lb

## 2015-06-17 DIAGNOSIS — E876 Hypokalemia: Secondary | ICD-10-CM | POA: Diagnosis not present

## 2015-06-17 LAB — BASIC METABOLIC PANEL
BUN: 13 mg/dL (ref 6–23)
CALCIUM: 9.2 mg/dL (ref 8.4–10.5)
CO2: 29 meq/L (ref 19–32)
Chloride: 99 mEq/L (ref 96–112)
Creatinine, Ser: 0.88 mg/dL (ref 0.40–1.50)
GFR: 110.41 mL/min (ref >60.00)
Glucose, Bld: 113 mg/dL — ABNORMAL HIGH (ref 70–99)
Potassium: 3.1 mEq/L — ABNORMAL LOW (ref 3.5–5.1)
SODIUM: 138 meq/L (ref 135–145)

## 2015-06-17 NOTE — Progress Notes (Signed)
Subjective:    Patient ID: Kurt Blair, male    DOB: [birth year], 70 y.o.   MRN: [id number]  HPI The state of at least three ongoing medical problems is addressed today, with interval history of each noted here: Edema: pt says much better. HTN: he takes meds as rx'ed.  Denies sob.  Hypokalemia: denies cramps.  He was rx'ed KCl liquid, but he says K-LOR was cheapest.  He takes 1/day of 10 mEq.  Past Medical History  Diagnosis Date  . ANXIETY 02/16/2007  . ERECTILE DYSFUNCTION 02/16/2007  . DEPRESSION 02/16/2007  . VENOUS INSUFFICIENCY 02/16/2007  . FATTY LIVER DISEASE 06/07/2008  . SUBACROMIAL BURSITIS, RIGHT 06/07/2008  . HYPERGLYCEMIA 02/16/2007  . PULMONARY EMBOLISM, HX OF 02/16/2007  . NASH (nonalcoholic steatohepatitis)   . Diverticulosis   . Internal hemorrhoid   . Tubular adenoma of colon 07/2001    Past Surgical History  Procedure Laterality Date  . Drainage fluid from chest  1990  . Leg stripping  2001-07-10    Left     Social History   Social History  . Marital Status: Married    Spouse Name: N/A  . Number of Children: N/A  . Years of Education: N/A   Occupational History  . Landscaping    Social History Main Topics  . Smoking status: Current Every Day Smoker -- 0.50 packs/day for 50 years  . Smokeless tobacco: Not on file  . Alcohol Use: Not on file  . Drug Use: Not on file  . Sexual Activity: Not on file   Other Topics Concern  . Not on file   Social History Narrative   [name] (who pt and wife raised) died of cancer July 10, 2009    Current Outpatient Prescriptions on File Prior to Visit  Medication Sig Dispense Refill  . atorvastatin (LIPITOR) 80 MG tablet Take 1 tablet (80 mg total) by mouth daily. 90 tablet 3  . carvedilol (COREG) 6.25 MG tablet TAKE 1 TABLET BY MOUTH TWICE A DAY WITH A MEAL 60 tablet 1  . furosemide (LASIX) 40 MG tablet Take 1 tablet (40 mg total) by mouth daily. 30 tablet 3  . losartan-hydrochlorothiazide (HYZAAR) 100-25 MG tablet Take 1  tablet by mouth daily. 30 tablet 5   No current facility-administered medications on file prior to visit.    No Known Allergies  Family History  Problem Relation Age of Onset  . Cancer Mother     uncertain type  . Cancer Sister     Lung Disease  . Heart disease Neg Hx     BP 144/88 mmHg  Pulse 65  Temp(Src) 97.7 F (36.5 C) (Oral)  Ht 5' 10"  (1.778 m)  Wt 258 lb (117.028 kg)  BMI 37.02 kg/m2  SpO2 93%  Review of Systems Denies chest pain and weight change    Objective:   Physical Exam VITAL SIGNS:  See vs page GENERAL: no distress LUNGS:  Clear to auscultation Ext: trace bilat leg edema.     Lab Results  Component Value Date   CREATININE 0.88 06/17/2015   BUN 13 06/17/2015   NA 138 06/17/2015   K 3.1* 06/17/2015   CL 99 06/17/2015   CO2 29 06/17/2015      Assessment & Plan:  Hypokalemia: he needs increased rx HTN: improved: Please continue the same medications for now.  Edema: improved.  Please continue the same lasix for now.   Patient is advised the following: Patient Instructions  blood tests are requested for  you today.  We'll let you know about the results. Please come back for a follow-up appointment in 4 months.      addendum: i have sent a prescription to your pharmacy, to double the [facility]

## 2015-06-17 NOTE — Patient Instructions (Addendum)
blood tests are requested for you today.  We'll let you know about the results. Please come back for a follow-up appointment in 4 months.

## 2015-06-18 ENCOUNTER — Encounter: Payer: Self-pay | Admitting: Gastroenterology

## 2015-06-18 ENCOUNTER — Ambulatory Visit (INDEPENDENT_AMBULATORY_CARE_PROVIDER_SITE_OTHER): Payer: Medicare Other | Admitting: Gastroenterology

## 2015-06-18 VITALS — BP 128/88 | HR 76 | Ht 70.0 in | Wt 258.4 lb

## 2015-06-18 DIAGNOSIS — K921 Melena: Secondary | ICD-10-CM | POA: Diagnosis not present

## 2015-06-18 DIAGNOSIS — R1030 Lower abdominal pain, unspecified: Secondary | ICD-10-CM

## 2015-06-18 DIAGNOSIS — Z86010 Personal history of colonic polyps: Secondary | ICD-10-CM

## 2015-06-18 MED ORDER — POTASSIUM CHLORIDE CRYS ER 20 MEQ PO TBCR: 20.0000 meq | EXTENDED_RELEASE_TABLET | Freq: Two times a day (BID) | ORAL | Status: DC

## 2015-06-18 MED ORDER — NA SULFATE-K SULFATE-MG SULF 17.5-3.13-1.6 GM/177ML PO SOLN: 1.0000 | Freq: Once | ORAL | Status: DC

## 2015-06-18 NOTE — Patient Instructions (Signed)
You have been scheduled for a colonoscopy. Please follow written instructions given to you at your visit today.  Please pick up your prep supplies at the pharmacy within the next 1-3 days. If you use inhalers (even only as needed), please bring them with you on the day of your procedure. Your physician has requested that you go to www.startemmi.com and enter the access code given to you at your visit today. This web site gives a general overview about your procedure. However, you should still follow specific instructions given to you by our office regarding your preparation for the procedure.  Thank you for choosing me and [facility].  Malcolm T. Stark, Jr., MD., FACG  

## 2015-06-18 NOTE — Progress Notes (Signed)
    History of Present Illness: This is a 70 year old male referred by Renato Shin, MD for the evaluation of rectal bleeding and personal history of adenomatous colon polyps.  He underwent colonoscopy in March 2003 which showed 4 adenomatous colon polyps  Measuring between 3 and 11 mm. Diverticulosis and internal hemorrhoids were noted on that exam. A 2 year follow-up is recommended however he did not return. He has not had a colonoscopy since. He notes 2 episodes of rectal bleeding over the past 2 months that cleared spontaneously. He is evaluated his primary care physician's office where internal hemorrhoid was noted on rectal exam. He has  Infrequent mild dull lower abdominal pains that are bothersome about once every few months. He cannot relate it to bowel movements or meals. Denies weight loss, constipation, diarrhea, change in stool caliber, melena, nausea, vomiting, dysphagia, reflux symptoms, chest pain.  Review of Systems: Pertinent positive and negative review of systems were noted in the above HPI section. All other review of systems were otherwise negative.  Current Medications, Allergies, Past Medical History, Past Surgical History, Family History and Social History were reviewed in [employer] record.  Physical Exam: General: Well developed, well nourished, no acute distress Head: Normocephalic and atraumatic Eyes:  sclerae anicteric, EOMI Ears: Normal auditory acuity Mouth: No deformity or lesions Neck: Supple, no masses or thyromegaly Lungs: Clear throughout to auscultation Heart: Regular rate and rhythm; no murmurs, rubs or bruits Abdomen: Soft, non tender and non distended. No masses, hepatosplenomegaly or hernias noted. Normal Bowel sounds Rectal:  Deferred to colonoscopy Musculoskeletal: Symmetrical with no gross deformities  Skin: No lesions on visible extremities Pulses:  Normal pulses noted Extremities: No clubbing, cyanosis, edema or deformities  noted Neurological: Alert oriented x 4, grossly nonfocal Cervical Nodes:  No significant cervical adenopathy Inguinal Nodes: No significant inguinal adenopathy Psychological:  Alert and cooperative. Normal mood and affect  Assessment and Recommendations:  1. Hematochezia, personal history of adenomatous colon polyps and infrequent mild lower abdominal pain. Rule out colorectal neoplasms, hemorrhoids and other disorders. Schedule colonoscopy. The risks (including bleeding, perforation, infection, missed lesions, medication reactions and possible hospitalization or surgery if complications occur), benefits, and alternatives to colonoscopy with possible biopsy and possible polypectomy were discussed with the patient and they consent to proceed.     cc: Renato Shin, MD [address] [place], [place] [address]

## 2015-06-29 ENCOUNTER — Other Ambulatory Visit: Payer: Self-pay | Admitting: Endocrinology

## 2015-07-02 ENCOUNTER — Telehealth: Payer: Self-pay | Admitting: Gastroenterology

## 2015-07-02 NOTE — Telephone Encounter (Signed)
Informed patient I have a free sample of Suprep he can pick up at the pharmacy. Pt will come by tomorrow.

## 2015-07-15 ENCOUNTER — Ambulatory Visit (AMBULATORY_SURGERY_CENTER): Payer: Medicare Other | Admitting: Gastroenterology

## 2015-07-15 ENCOUNTER — Encounter: Payer: Self-pay | Admitting: Gastroenterology

## 2015-07-15 VITALS — BP 117/84 | HR 63 | Temp 96.8°F | Resp 14 | Ht 70.0 in | Wt 258.0 lb

## 2015-07-15 DIAGNOSIS — D122 Benign neoplasm of ascending colon: Secondary | ICD-10-CM | POA: Diagnosis not present

## 2015-07-15 DIAGNOSIS — D124 Benign neoplasm of descending colon: Secondary | ICD-10-CM

## 2015-07-15 DIAGNOSIS — Z86010 Personal history of colonic polyps: Secondary | ICD-10-CM

## 2015-07-15 DIAGNOSIS — D123 Benign neoplasm of transverse colon: Secondary | ICD-10-CM

## 2015-07-15 LAB — HM COLONOSCOPY

## 2015-07-15 MED ORDER — SODIUM CHLORIDE 0.9 % IV SOLN: 500.0000 mL | INTRAVENOUS | Status: DC

## 2015-07-15 NOTE — Progress Notes (Signed)
Report to PACU, RN, vss, BBS= Clear.  

## 2015-07-15 NOTE — Patient Instructions (Signed)
Colon polyps removed today, hemorrhoids and diverticulosis seen. Handouts given on polyps,hemorrhoids,diverticulosis and high fiber diet.  Try to follow high fiber diet with liberal fluid intake. Result letter in your mail in about 3 weeks. Thank you!  YOU HAD AN ENDOSCOPIC PROCEDURE TODAY AT [facility]:   Refer to the procedure report that was given to you for any specific questions about what was found during the examination.  If the procedure report does not answer your questions, please call your gastroenterologist to clarify.  If you requested that your care partner not be given the details of your procedure findings, then the procedure report has been included in a sealed envelope for you to review at your convenience later.  YOU SHOULD EXPECT: Some feelings of bloating in the abdomen. Passage of more gas than usual.  Walking can help get rid of the air that was put into your GI tract during the procedure and reduce the bloating. If you had a lower endoscopy (such as a colonoscopy or flexible sigmoidoscopy) you may notice spotting of blood in your stool or on the toilet paper. If you underwent a bowel prep for your procedure, you may not have a normal bowel movement for a few days.  Please Note:  You might notice some irritation and congestion in your nose or some drainage.  This is from the oxygen used during your procedure.  There is no need for concern and it should clear up in a day or so.  SYMPTOMS TO REPORT IMMEDIATELY:   Following lower endoscopy (colonoscopy or flexible sigmoidoscopy):  Excessive amounts of blood in the stool  Significant tenderness or worsening of abdominal pains  Swelling of the abdomen that is new, acute  Fever of 100F or higher   For urgent or emergent issues, a gastroenterologist can be reached at any hour by calling [phone].   DIET: Your first meal following the procedure should be a small meal and then it is ok to progress to your  normal diet. Heavy or fried foods are harder to digest and may make you feel nauseous or bloated.  Likewise, meals heavy in dairy and vegetables can increase bloating.  Drink plenty of fluids but you should avoid alcoholic beverages for 24 hours.  ACTIVITY:  You should plan to take it easy for the rest of today and you should NOT DRIVE or use heavy machinery until tomorrow (because of the sedation medicines used during the test).    FOLLOW UP: Our staff will call the number listed on your records the next business day following your procedure to check on you and address any questions or concerns that you may have regarding the information given to you following your procedure. If we do not reach you, we will leave a message.  However, if you are feeling well and you are not experiencing any problems, there is no need to return our call.  We will assume that you have returned to your regular daily activities without incident.  If any biopsies were taken you will be contacted by phone or by letter within the next 1-3 weeks.  Please call us at [phone] if you have not heard about the biopsies in 3 weeks.    SIGNATURES/CONFIDENTIALITY: You and/or your care partner have signed paperwork which will be entered into your electronic medical record.  These signatures attest to the fact that that the information above on your After Visit Summary has been reviewed and is understood.  Full  responsibility of the confidentiality of this discharge information lies with you and/or your care-partner.

## 2015-07-15 NOTE — Op Note (Signed)
[place]  [address] [place], [address]   COLONOSCOPY PROCEDURE REPORT  PATIENT: Kurt, Blair  MR#: [id number] BIRTHDATE: January 12, 1946 , 70  yrs. old GENDER: male ENDOSCOPIST: Ladene Artist, MD, [facility] Regal REFERRED BY: Donavan Foil, M.D. PROCEDURE DATE:  07/15/2015 PROCEDURE:   Colonoscopy, surveillance , Colonoscopy with biopsy, and Colonoscopy with snare polypectomy First Screening Colonoscopy - Avg.  risk and is 50 yrs.  old or older - No.  Prior Negative Screening - Now for repeat screening. N/A  History of Adenoma - Now for follow-up colonoscopy & has been > or = to 3 yrs.  Yes hx of adenoma.  Has been 3 or more years since last colonoscopy.  Polyps removed today? Yes ASA CLASS:   Class II INDICATIONS:Surveillance due to prior colonic neoplasia and PH Colon Adenoma. MEDICATIONS: Monitored anesthesia care and Propofol 200 mg IV  DESCRIPTION OF PROCEDURE:   After the risks benefits and alternatives of the procedure were thoroughly explained, informed consent was obtained.  The digital rectal exam revealed no abnormalities of the rectum.   The LB [id number]  endoscope was introduced through the anus and advanced to the cecum, which was identified by both the appendix and ileocecal valve. No adverse events experienced.   The quality of the prep was good.  (Suprep was used)  The instrument was then slowly withdrawn as the colon was fully examined. Estimated blood loss is zero unless otherwise noted in this procedure report.    COLON FINDINGS: Three sessile polyps measuring 6-7 mm in size were found in the descending colon (2) and ascending colon (1). Polypectomies were performed with a cold snare.  The resection was complete, the polyp tissue was completely retrieved and sent to histology.  Two sessile polyps measuring 4 mm in size were found in the transverse colon.  Polypectomies were performed with cold forceps.  The resection was complete, the  polyp tissue was completely retrieved and sent to histology.  There was mild diverticulosis noted in the transverse colon. There was moderate diverticulosis noted in the sigmoid colon and descending colon with associated colonic spasm, luminal narrowing and muscular hypertrophy.   The examination was otherwise normal.  Retroflexed views revealed internal Grade I hemorrhoids. The time to cecum = 1.5 Withdrawal time = 15.3   The scope was withdrawn and the procedure completed. COMPLICATIONS: There were no immediate complications.  ENDOSCOPIC IMPRESSION: 1.   Three sessile polyps in the descending colon and ascending colon; polypectomies performed with a cold snare 2.   Two sessile polyps in the transverse colon; polypectomies performed with cold forceps 3.   Mild diverticulosis in the transverse colon 4.   Moderate diverticulosis in the sigmoid colon and descending colon 5.   Grade l internal hemorrhoids  RECOMMENDATIONS: 1.  Await pathology results 2.  High fiber diet with liberal fluid intake. 3.  Repeat Colonoscopy in 3-5 years pending pathology review  eSigned:  Ladene Artist, MD, [facility] 07/15/2015 3:57 PM     PATIENT NAME:  Kurt, Blair MR#: [id number]

## 2015-07-15 NOTE — Progress Notes (Signed)
Called to room to assist during endoscopic procedure.  Patient ID and intended procedure confirmed with present staff. Received instructions for my participation in the procedure from the performing physician.  

## 2015-07-16 ENCOUNTER — Telehealth: Payer: Self-pay

## 2015-07-16 NOTE — Telephone Encounter (Signed)
  Follow up Call-  Call back number 07/15/2015  Post procedure Call Back phone  # [phone] hm  Permission to leave phone message Yes    Patient was called for follow up after his procedure on 07/15/2015. I spoke with the patients wife and she reports that Kurt Blair has returned to his normal daily activities without difficulty.

## 2015-07-29 ENCOUNTER — Encounter: Payer: Self-pay | Admitting: Gastroenterology

## 2015-08-28 ENCOUNTER — Other Ambulatory Visit: Payer: Self-pay | Admitting: Endocrinology

## 2015-09-14 ENCOUNTER — Other Ambulatory Visit: Payer: Self-pay | Admitting: Endocrinology

## 2015-09-17 ENCOUNTER — Other Ambulatory Visit: Payer: Self-pay

## 2015-09-17 MED ORDER — LOSARTAN POTASSIUM-HCTZ 100-25 MG PO TABS: 1.0000 | ORAL_TABLET | Freq: Every day | ORAL | Status: DC

## 2015-09-25 ENCOUNTER — Other Ambulatory Visit: Payer: Self-pay

## 2015-09-25 MED ORDER — FUROSEMIDE 40 MG PO TABS: ORAL_TABLET | ORAL | Status: DC

## 2015-09-25 MED ORDER — LOSARTAN POTASSIUM-HCTZ 100-25 MG PO TABS: 1.0000 | ORAL_TABLET | Freq: Every day | ORAL | Status: DC

## 2015-10-15 ENCOUNTER — Ambulatory Visit: Payer: Medicare Other | Admitting: Endocrinology

## 2015-10-18 ENCOUNTER — Ambulatory Visit (INDEPENDENT_AMBULATORY_CARE_PROVIDER_SITE_OTHER): Payer: Medicare Other | Admitting: Endocrinology

## 2015-10-18 ENCOUNTER — Encounter: Payer: Self-pay | Admitting: Endocrinology

## 2015-10-18 VITALS — BP 157/88 | HR 70 | Temp 98.2°F | Ht 70.0 in | Wt 261.6 lb

## 2015-10-18 DIAGNOSIS — R0602 Shortness of breath: Secondary | ICD-10-CM

## 2015-10-18 DIAGNOSIS — I10 Essential (primary) hypertension: Secondary | ICD-10-CM

## 2015-10-18 LAB — BASIC METABOLIC PANEL
BUN: 18 mg/dL (ref 6–23)
CALCIUM: 9.2 mg/dL (ref 8.4–10.5)
CO2: 30 meq/L (ref 19–32)
CREATININE: 0.92 mg/dL (ref 0.40–1.50)
Chloride: 97 mEq/L (ref 96–112)
GFR: 104.78 mL/min (ref >60.00)
GLUCOSE: 113 mg/dL — AB (ref 70–99)
Potassium: 2.9 mEq/L — ABNORMAL LOW (ref 3.5–5.1)
SODIUM: 137 meq/L (ref 135–145)

## 2015-10-18 LAB — BRAIN NATRIURETIC PEPTIDE: Pro B Natriuretic peptide (BNP): 15 pg/mL (ref 0.0–100.0)

## 2015-10-18 MED ORDER — LISINOPRIL 5 MG PO TABS
5.0000 mg | ORAL_TABLET | Freq: Every day | ORAL | Status: DC
Start: 2015-10-18 — End: 2016-07-14

## 2015-10-18 NOTE — Patient Instructions (Addendum)
blood tests are requested for you today.  We'll let you know about the results. Based on the results, I may be able to add a pill called "lisinopril." The alternative is to increase the furosemide, but I hesitate to do so.   Please come back for a follow-up appointment in 4 months.

## 2015-10-18 NOTE — Progress Notes (Signed)
Pre visit review using our clinic tool,if applicable. No additional management support is needed unless otherwise documented below in the visit note.  

## 2015-10-18 NOTE — Progress Notes (Signed)
Subjective:    Patient ID: Kurt Blair, male    DOB: 01/17/1946, 70 y.o.   MRN: [id number]  HPI The state of at least three ongoing medical problems is addressed today, with interval history of each noted here: Edema: pt says less recently. HTN: he takes meds as rx'ed.  He has slight doe.  Hypokalemia: denies cramps.  He takes KCl as rx'ed.   Past Medical History  Diagnosis Date  . ANXIETY 02/16/2007  . ERECTILE DYSFUNCTION 02/16/2007  . DEPRESSION 02/16/2007  . VENOUS INSUFFICIENCY 02/16/2007  . FATTY LIVER DISEASE 06/07/2008  . SUBACROMIAL BURSITIS, RIGHT 06/07/2008  . HYPERGLYCEMIA 02/16/2007  . PULMONARY EMBOLISM, HX OF 02/16/2007  . NASH (nonalcoholic steatohepatitis)   . Diverticulosis   . Internal hemorrhoid   . Tubular adenoma of colon 07/2001  . Hyperlipidemia     Past Surgical History  Procedure Laterality Date  . Drainage fluid from chest  1990  . Leg stripping  2001/08/03    Left   . Colonoscopy      Social History   Social History  . Marital Status: Married    Spouse Name: N/A  . Number of Children: N/A  . Years of Education: N/A   Occupational History  . Landscaping     retired   Social History Main Topics  . Smoking status: Former Smoker -- 0.50 packs/day for 50 years    Quit date: 05/11/2013  . Smokeless tobacco: Never Used  . Alcohol Use: 8.4 oz/week    0 Standard drinks or equivalent, 14 Shots of liquor per week     Comment: every night 6oz brandy  . Drug Use: No  . Sexual Activity: Not on file   Other Topics Concern  . Not on file   Social History Narrative   [name] (who pt and wife raised) died of cancer 2009-08-03    Current Outpatient Prescriptions on File Prior to Visit  Medication Sig Dispense Refill  . atorvastatin (LIPITOR) 80 MG tablet Take 1 tablet (80 mg total) by mouth daily. 90 tablet 3  . carvedilol (COREG) 6.25 MG tablet TAKE 1 TABLET BY MOUTH TWICE A DAY WITH A MEAL 60 tablet 1  . furosemide (LASIX) 40 MG tablet TAKE 1 TABLET (40  MG TOTAL) BY MOUTH DAILY. 30 tablet 3  . losartan-hydrochlorothiazide (HYZAAR) 100-25 MG tablet Take 1 tablet by mouth daily. 90 tablet 1  . potassium chloride SA (K-DUR,KLOR-CON) 20 MEQ tablet Take 1 tablet (20 mEq total) by mouth 2 (two) times daily. 30 tablet 11   No current facility-administered medications on file prior to visit.    No Known Allergies  Family History  Problem Relation Age of Onset  . Cancer Mother     uncertain type  . Cancer Sister     Lung Disease  . Colon cancer Neg Hx   . Esophageal cancer Neg Hx   . Rectal cancer Neg Hx   . Stomach cancer Neg Hx   . Prostate cancer Neg Hx   . Pancreatic cancer Neg Hx     BP 157/88 mmHg  Pulse 70  Temp(Src) 98.2 F (36.8 C) (Oral)  Ht 5' 10"  (1.778 m)  Wt 261 lb 9.6 oz (118.661 kg)  BMI 37.54 kg/m2  SpO2 98%  Review of Systems Denies chest pain.  He has gained a few lbs.      Objective:   Physical Exam VITAL SIGNS:  See vs page GENERAL: no distress Ext: 1+ bilat leg edema  Lab Results  Component Value Date   CREATININE 0.92 10/18/2015   BUN 18 10/18/2015   NA 137 10/18/2015   K 2.9* 10/18/2015   CL 97 10/18/2015   CO2 30 10/18/2015      Assessment & Plan:  HTN: he needs increased rx.  i added zestril Hypokalemia: we'll recheck on zestril Edema: stable: we'll follow, as zestril may help.  Patient is advised the following: Patient Instructions  blood tests are requested for you today.  We'll let you know about the results. Based on the results, I may be able to add a pill called "lisinopril." The alternative is to increase the furosemide, but I hesitate to do so.   Please come back for a follow-up appointment in 4 months.      Renato Shin, MD

## 2015-10-27 ENCOUNTER — Other Ambulatory Visit: Payer: Self-pay | Admitting: Endocrinology

## 2015-12-25 ENCOUNTER — Other Ambulatory Visit: Payer: Self-pay | Admitting: Endocrinology

## 2016-02-11 ENCOUNTER — Ambulatory Visit: Payer: Medicare Other | Admitting: Endocrinology

## 2016-02-13 ENCOUNTER — Ambulatory Visit: Payer: Medicare Other | Admitting: Endocrinology

## 2016-02-14 ENCOUNTER — Ambulatory Visit: Payer: Medicare Other | Admitting: Endocrinology

## 2016-02-21 ENCOUNTER — Ambulatory Visit: Payer: Medicare Other | Admitting: Endocrinology

## 2016-02-21 ENCOUNTER — Ambulatory Visit (INDEPENDENT_AMBULATORY_CARE_PROVIDER_SITE_OTHER): Payer: Medicare Other | Admitting: Endocrinology

## 2016-02-21 VITALS — BP 134/84 | HR 65 | Ht 70.0 in | Wt 255.0 lb

## 2016-02-21 DIAGNOSIS — I10 Essential (primary) hypertension: Secondary | ICD-10-CM | POA: Diagnosis not present

## 2016-02-21 DIAGNOSIS — Z0000 Encounter for general adult medical examination without abnormal findings: Secondary | ICD-10-CM

## 2016-02-21 DIAGNOSIS — Z23 Encounter for immunization: Secondary | ICD-10-CM

## 2016-02-21 LAB — BASIC METABOLIC PANEL
BUN: 18 mg/dL (ref 6–23)
CHLORIDE: 99 meq/L (ref 96–112)
CO2: 30 meq/L (ref 19–32)
Calcium: 9.3 mg/dL (ref 8.4–10.5)
Creatinine, Ser: 0.95 mg/dL (ref 0.40–1.50)
GFR: 100.87 mL/min (ref >60.00)
Glucose, Bld: 99 mg/dL (ref 70–99)
Potassium: 3.5 mEq/L (ref 3.5–5.1)
SODIUM: 138 meq/L (ref 135–145)

## 2016-02-21 NOTE — Progress Notes (Signed)
we discussed code status.  pt requests full code, but would not want to be started or maintained on artificial life-support measures if there was not a reasonable chance of recovery 

## 2016-02-21 NOTE — Patient Instructions (Addendum)
blood tests are requested for you today.  We'll let you know about the results.  good diet and exercise significantly improve your health.  please let me know if you wish to be referred to a dietician.  high blood sugar is very risky to your health.  you should see an eye doctor and dentist every year.  It is very important to get all recommended vaccinations.  Please consider these measures for your health:  minimize alcohol.  Do not use tobacco products.  Have a colonoscopy at least every 10 years from age 87.  Women should have an annual mammogram from age 49.  Keep firearms safely stored.  Always use seat belts.  have working smoke alarms in your home.  See an eye doctor and dentist regularly.  Never drive under the influence of alcohol or drugs (including prescription drugs).   It is critically important to prevent falling down (keep floor areas well-lit, dry, and free of loose objects.  If you have a cane, walker, or wheelchair, you should use it, even for short trips around the house.  Wear flat-soled shoes.  Also, try not to rush).  Please come back for a regular physical appointment in 4 months.

## 2016-02-21 NOTE — Progress Notes (Signed)
Subjective:    Patient ID: Kurt Blair, male    DOB: 01/31/1946, 70 y.o.   MRN: [id number]  HPI The state of at least three ongoing medical problems is addressed today, with interval history of each noted here: Edema: pt says less recently.  HTN: he takes meds as rx'ed.  He still has slight doe.  Hypokalemia: denies cramps.  He takes KCl as rx'ed.   Past Medical History:  Diagnosis Date  . ANXIETY 02/16/2007  . DEPRESSION 02/16/2007  . Diverticulosis   . ERECTILE DYSFUNCTION 02/16/2007  . FATTY LIVER DISEASE 06/07/2008  . HYPERGLYCEMIA 02/16/2007  . Hyperlipidemia   . Internal hemorrhoid   . NASH (nonalcoholic steatohepatitis)   . PULMONARY EMBOLISM, HX OF 02/16/2007  . SUBACROMIAL BURSITIS, RIGHT 06/07/2008  . Tubular adenoma of colon 07/2001  . VENOUS INSUFFICIENCY 02/16/2007    Past Surgical History:  Procedure Laterality Date  . COLONOSCOPY    . Drainage fluid from chest  1990  . Leg stripping  July 29, 2001   Left     Social History   Social History  . Marital status: Married    Spouse name: N/A  . Number of children: N/A  . Years of education: N/A   Occupational History  . Landscaping     retired   Social History Main Topics  . Smoking status: Former Smoker    Packs/day: 0.50    Years: 50.00    Quit date: 05/11/2013  . Smokeless tobacco: Never Used  . Alcohol use 8.4 oz/week    14 Shots of liquor per week     Comment: every night 6oz brandy  . Drug use: No  . Sexual activity: Not on file   Other Topics Concern  . Not on file   Social History Narrative   [name] (who pt and wife raised) died of cancer 07-29-2009    Current Outpatient Prescriptions on File Prior to Visit  Medication Sig Dispense Refill  . atorvastatin (LIPITOR) 80 MG tablet Take 1 tablet (80 mg total) by mouth daily. 90 tablet 3  . carvedilol (COREG) 6.25 MG tablet TAKE 1 TABLET BY MOUTH TWICE A DAY WITH A MEAL 60 tablet 1  . furosemide (LASIX) 40 MG tablet TAKE 1 TABLET (40 MG TOTAL) BY MOUTH  DAILY. 30 tablet 3  . lisinopril (PRINIVIL,ZESTRIL) 5 MG tablet Take 1 tablet (5 mg total) by mouth daily. 90 tablet 3  . losartan-hydrochlorothiazide (HYZAAR) 100-25 MG tablet Take 1 tablet by mouth daily. 90 tablet 1  . potassium chloride SA (K-DUR,KLOR-CON) 20 MEQ tablet Take 1 tablet (20 mEq total) by mouth 2 (two) times daily. 30 tablet 11   No current facility-administered medications on file prior to visit.     No Known Allergies  Family History  Problem Relation Age of Onset  . Cancer Mother     uncertain type  . Cancer Sister     Lung Disease  . Colon cancer Neg Hx   . Esophageal cancer Neg Hx   . Rectal cancer Neg Hx   . Stomach cancer Neg Hx   . Prostate cancer Neg Hx   . Pancreatic cancer Neg Hx     BP 134/84   Pulse 65   Ht 5' 10"  (1.778 m)   Wt 255 lb (115.7 kg)   SpO2 95%   BMI 36.59 kg/m    Review of Systems Denies numbness and chest pain.     Objective:   Physical Exam VITAL SIGNS:  See  vs page.  GENERAL: no distress.  Ext: 1+ bilat leg edema.      i personally reviewed electrocardiogram tracing (today):  Indication: HTN Impression: normal  Lab Results  Component Value Date   CREATININE 0.95 02/21/2016   BUN 18 02/21/2016   NA 138 02/21/2016   K 3.5 02/21/2016   CL 99 02/21/2016   CO2 30 02/21/2016      Assessment & Plan:  Edema: improved Hypokalemia: well-replaced HTN: well-controlled  Please continue the same medications    Subjective:   Patient here for Medicare annual wellness visit and management of other chronic and acute problems.     Risk factors: advanced age    53 of Physicians Providing Medical Care to Patient:  See "snapshot"   Activities of Daily Living: In your present state of health, do you have any difficulty performing the following activities (lives alone)?:  Preparing food and eating?: No  Bathing yourself: No  Getting dressed: No  Using the toilet:No  Moving around from place to place: No  In  the past year have you fallen or had a near fall?: No    Home Safety: Has smoke detector and wears seat belts. Firearms are safely stored.   Diet and Exercise  Current exercise habits: pt says good Dietary issues discussed: pt reports a healthy diet   Depression Screen  Q1: Over the past two weeks, have you felt down, depressed or hopeless? no  Q2: Over the past two weeks, have you felt little interest or pleasure in doing things? no   The following portions of the patient's history were reviewed and updated as appropriate: allergies, current medications, past family history, past medical history, past social history, past surgical history and problem list.   Review of Systems  Denies hearing loss, and visual loss Objective:   Vision:  VA is done today Hearing: grossly normal. Body mass index:  See vs page Msk: pt easily and quickly performs "get-up-and-go" from a sitting position Cognitive Impairment Assessment: cognition, memory and judgment appear normal.  remembers 2/3 at 5 minutes (? effort).  excellent recall.  can easily read and write a sentence.  alert and oriented x 3   Assessment:   Medicare wellness utd on preventive parameters    Plan:   During the course of the visit the patient was educated and counseled about appropriate screening and preventive services including:        Fall prevention   Diabetes screening  Nutrition counseling   Vaccines / LABS Zostavax / Pneumococcal Vaccine today  PSA is up to date  Patient Instructions (the written plan) was given to the patient.

## 2016-02-22 LAB — HEPATITIS C ANTIBODY: HCV AB: NEGATIVE

## 2016-02-24 ENCOUNTER — Other Ambulatory Visit: Payer: Self-pay | Admitting: Endocrinology

## 2016-04-22 ENCOUNTER — Other Ambulatory Visit: Payer: Self-pay | Admitting: Endocrinology

## 2016-04-26 ENCOUNTER — Other Ambulatory Visit: Payer: Self-pay | Admitting: Endocrinology

## 2016-04-27 ENCOUNTER — Telehealth: Payer: Self-pay | Admitting: Endocrinology

## 2016-04-27 DIAGNOSIS — M25561 Pain in right knee: Secondary | ICD-10-CM

## 2016-04-27 DIAGNOSIS — M25562 Pain in left knee: Principal | ICD-10-CM

## 2016-04-27 NOTE — Telephone Encounter (Signed)
PT called in and was wondering if we could give him Cortisone Shots for his knees.

## 2016-04-28 DIAGNOSIS — M25562 Pain in left knee: Secondary | ICD-10-CM

## 2016-04-28 DIAGNOSIS — M25561 Pain in right knee: Secondary | ICD-10-CM | POA: Insufficient documentation

## 2016-04-28 NOTE — Telephone Encounter (Signed)
done

## 2016-04-28 NOTE — Telephone Encounter (Signed)
See message and please advise, Thanks!  

## 2016-04-28 NOTE — Telephone Encounter (Signed)
I contacted the patient and advised of message. Pateint agreed to the referral for sports medicine.

## 2016-04-28 NOTE — Telephone Encounter (Signed)
We don't do here, but I would be happy to ref to sports medicine to see what they think.

## 2016-04-29 NOTE — Telephone Encounter (Signed)
Patient notified referral has been placed. Patient had no further questions at this time.

## 2016-06-04 ENCOUNTER — Ambulatory Visit: Payer: Self-pay

## 2016-06-04 ENCOUNTER — Encounter: Payer: Self-pay | Admitting: Sports Medicine

## 2016-06-04 ENCOUNTER — Ambulatory Visit (INDEPENDENT_AMBULATORY_CARE_PROVIDER_SITE_OTHER): Payer: Medicare Other | Admitting: Sports Medicine

## 2016-06-04 VITALS — BP 150/82 | HR 71 | Ht 70.0 in | Wt 255.0 lb

## 2016-06-04 DIAGNOSIS — M1711 Unilateral primary osteoarthritis, right knee: Secondary | ICD-10-CM | POA: Diagnosis not present

## 2016-06-04 DIAGNOSIS — M19019 Primary osteoarthritis, unspecified shoulder: Secondary | ICD-10-CM | POA: Insufficient documentation

## 2016-06-04 DIAGNOSIS — M25511 Pain in right shoulder: Secondary | ICD-10-CM

## 2016-06-04 DIAGNOSIS — M19111 Post-traumatic osteoarthritis, right shoulder: Secondary | ICD-10-CM

## 2016-06-04 MED ORDER — METHYLPREDNISOLONE ACETATE 40 MG/ML IJ SUSP
40.0000 mg | Freq: Once | INTRAMUSCULAR | Status: AC
  Administered 2016-06-04: 40 mg via INTRA_ARTICULAR

## 2016-06-04 MED ORDER — TRAMADOL HCL 50 MG PO TABS: 50.0000 mg | ORAL_TABLET | Freq: Two times a day (BID) | ORAL | 1 refills | Status: DC

## 2016-06-04 NOTE — Progress Notes (Signed)
CC: RT shoulder and bilateral knee pain  Patient is now retired Did years of landscape work Now with 3 months of RT shoulder pain Wakens him at night Unable to get arm over head for past 3 mos  RT knee pain All along medial side Was walking for exercise but now often too painful Seems to swell at times  LT knee has some rare pain but usually only after standing too long  Past Hx Reviewed Active Tx for HTN and for lipids Takes his medications as listed Other chronic problems noted but states not problems at present  Soc HX Stopped smoking in 2015  ROS No giving way of RT knee No locking Night time pain in both RT knee and shoulder  PE STout appearing B M in NAD BP (!) 150/82   Pulse 71   Ht 5' 10"  (1.778 m)   Wt 255 lb (115.7 kg)   BMI 36.59 kg/m   Rt shoulder shows very limited ROM Back scratch to RT hip pocket only Elevation to 80 deg only in abduction Elevation to 90 deg with flexion Weak on Suprsspin testing w drop arm Weak on ER  RT knee  No effusion Bony hypertrophy along medial joint Varus stance Has good ROM  Left knee shows good ROM Less bony changes noted  Shoulder Ultrasound: RT There is diminished space and calcification in RT AC joint Humeral head shows irregularity with superior spurring BT intact but on long view superior spurs impinge Supraspinatus tendon is not visualized and appears retracted Subscapularis shows mild atropy Infraspinatus is normal  RT knee NO SPP effusion Medial joint line shows grade 4 arthritic change with no joint space noted and calcification and swelling Lateral joint line mild degenerative changes meniscus Quad and patellar tendons OK  Left knee shows mild degenerative meniscal changes only with preserved joint space and only mild spurring noted along medial and lateral joint  Summary:  RT shoulder ultrasound consistent with 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 RT knee shows grade 4 arthritic change only along medial joint line with local swelling but no general effusion. Left knee unremarkable  Ultrasound and interpretation by Wolfgang Phoenix. Oneida Alar, MD

## 2016-06-04 NOTE — Assessment & Plan Note (Signed)
This likely explains most of his pain He has an old XR that confirmed this in 2010  Has an associated Rotator cuff tear that leads to weakness by [place] scanning  Procedure:  Injection of RT shoulder Consent obtained and verified. Time-out conducted. Noted no overlying erythema, induration, or other signs of local infection. Skin prepped in a sterile fashion. Topical analgesic spray: Ethyl chloride. Completed without difficulty.  Posterior window into joint space. Meds: 1 cc solumedrol 40 and 4 cc lidocaine Pain immediately improved suggesting accurate placement of the medication. Advised to call if fevers/chills, erythema, induration, drainage, or persistent bleeding.  Work on easy ROM  Add tramadol 50 bid for pain and reck in 2 mos

## 2016-06-04 NOTE — Assessment & Plan Note (Signed)
Procedure:  Injection of RT knee Consent obtained and verified. Time-out conducted. Noted no overlying erythema, induration, or other signs of local infection. Skin prepped in a sterile fashion. Topical analgesic spray: Ethyl chloride. Completed without difficulty.  I placed some medication at medial joint line and other into SPP Meds: 1cc solumedrol 40 and 4 cc lidocaine 1% Pain immediately improved suggesting accurate placement of the medication. Advised to call if fevers/chills, erythema, induration, drainage, or persistent bleeding.   Tramadol for pain Consider using compression sleeve Add some arch support on next visit

## 2016-06-05 ENCOUNTER — Telehealth: Payer: Self-pay | Admitting: Endocrinology

## 2016-06-05 NOTE — Telephone Encounter (Signed)
Pt called in and said that he was coming in for his annual physical soon with Dr. Loanne Drilling and he wants to know if he needs to come in for lab work before his appointment.  I advised the patient that it is okay if he doesn't come in for a lab appointment and can do it the same day, but the patient said he still had other questions for you.

## 2016-06-05 NOTE — Telephone Encounter (Addendum)
Patient ask you to give him a call. He didn't go into detail  because the call dropped.

## 2016-06-05 NOTE — Telephone Encounter (Signed)
Requested a call back from the patient to further discuss.

## 2016-06-08 NOTE — Telephone Encounter (Signed)
PATIENT IS RETURNING YOUR CALL

## 2016-06-08 NOTE — Telephone Encounter (Signed)
Requested a call back from the patient to discuss further.

## 2016-06-09 NOTE — Telephone Encounter (Signed)
How about next avail cpx appt.  We'll do labs then

## 2016-06-09 NOTE — Telephone Encounter (Signed)
1 month

## 2016-06-09 NOTE — Telephone Encounter (Signed)
I contacted the patient. He wanted to verify when his next yearly physical should be?

## 2016-06-09 NOTE — Telephone Encounter (Signed)
Patient wanted to know if he could come in early for his lab appointment so he could discuss during his office visit?

## 2016-06-10 NOTE — Telephone Encounter (Signed)
I contacted the patient and advised of message. Patient voiced understanding and scheduled CPE for 07/01/2016.

## 2016-06-11 ENCOUNTER — Other Ambulatory Visit: Payer: Self-pay | Admitting: Endocrinology

## 2016-06-15 ENCOUNTER — Other Ambulatory Visit: Payer: Self-pay | Admitting: Endocrinology

## 2016-06-23 ENCOUNTER — Other Ambulatory Visit: Payer: Self-pay | Admitting: Endocrinology

## 2016-06-23 ENCOUNTER — Ambulatory Visit: Payer: Medicare Other | Admitting: Endocrinology

## 2016-07-01 ENCOUNTER — Encounter: Payer: Self-pay | Admitting: Endocrinology

## 2016-07-01 ENCOUNTER — Ambulatory Visit: Payer: Medicare Other | Admitting: Endocrinology

## 2016-07-01 ENCOUNTER — Ambulatory Visit (INDEPENDENT_AMBULATORY_CARE_PROVIDER_SITE_OTHER): Payer: Medicare Other | Admitting: Endocrinology

## 2016-07-01 VITALS — BP 136/84 | HR 68 | Ht 70.0 in | Wt 258.0 lb

## 2016-07-01 DIAGNOSIS — I10 Essential (primary) hypertension: Secondary | ICD-10-CM | POA: Diagnosis not present

## 2016-07-01 DIAGNOSIS — Z125 Encounter for screening for malignant neoplasm of prostate: Secondary | ICD-10-CM | POA: Diagnosis not present

## 2016-07-01 DIAGNOSIS — D72819 Decreased white blood cell count, unspecified: Secondary | ICD-10-CM | POA: Diagnosis not present

## 2016-07-01 DIAGNOSIS — R739 Hyperglycemia, unspecified: Secondary | ICD-10-CM | POA: Diagnosis not present

## 2016-07-01 DIAGNOSIS — K7581 Nonalcoholic steatohepatitis (NASH): Secondary | ICD-10-CM | POA: Diagnosis not present

## 2016-07-01 LAB — CBC WITH DIFFERENTIAL/PLATELET
BASOS PCT: 0.5 % (ref 0.0–3.0)
Basophils Absolute: 0 10*3/uL (ref 0.0–0.1)
EOS ABS: 0.2 10*3/uL (ref 0.0–0.7)
Eosinophils Relative: 3.4 % (ref 0.0–5.0)
HEMATOCRIT: 43.2 % (ref 39.0–52.0)
HEMOGLOBIN: 14.9 g/dL (ref 13.0–17.0)
LYMPHS PCT: 29 % (ref 12.0–46.0)
Lymphs Abs: 1.7 10*3/uL (ref 0.7–4.0)
MCHC: 34.4 g/dL (ref 30.0–36.0)
MCV: 89.3 fl (ref 78.0–100.0)
Monocytes Absolute: 0.5 10*3/uL (ref 0.1–1.0)
Monocytes Relative: 8.5 % (ref 3.0–12.0)
Neutro Abs: 3.4 10*3/uL (ref 1.4–7.7)
Neutrophils Relative %: 58.6 % (ref 43.0–77.0)
Platelets: 218 10*3/uL (ref 150.0–400.0)
RBC: 4.83 Mil/uL (ref 4.22–5.81)
RDW: 14.6 % (ref 11.5–15.5)
WBC: 5.9 10*3/uL (ref 4.0–10.5)

## 2016-07-01 LAB — URINALYSIS, ROUTINE W REFLEX MICROSCOPIC
BILIRUBIN URINE: NEGATIVE
HGB URINE DIPSTICK: NEGATIVE
KETONES UR: NEGATIVE
LEUKOCYTES UA: NEGATIVE
NITRITE: NEGATIVE
RBC / HPF: NONE SEEN (ref 0–?)
SPECIFIC GRAVITY, URINE: 1.01 (ref 1.000–1.030)
Urine Glucose: NEGATIVE
Urobilinogen, UA: 1 (ref 0.0–1.0)
pH: 6 (ref 5.0–8.0)

## 2016-07-01 LAB — BASIC METABOLIC PANEL
BUN: 12 mg/dL (ref 6–23)
CHLORIDE: 102 meq/L (ref 96–112)
CO2: 30 meq/L (ref 19–32)
Calcium: 9.2 mg/dL (ref 8.4–10.5)
Creatinine, Ser: 0.84 mg/dL (ref 0.40–1.50)
GFR: 116.14 mL/min (ref >60.00)
GLUCOSE: 104 mg/dL — AB (ref 70–99)
POTASSIUM: 3.9 meq/L (ref 3.5–5.1)
SODIUM: 138 meq/L (ref 135–145)

## 2016-07-01 LAB — PSA: PSA: 1.2 ng/mL (ref 0.10–4.00)

## 2016-07-01 LAB — LIPID PANEL
CHOLESTEROL: 116 mg/dL (ref 0–200)
HDL: 38.9 mg/dL — ABNORMAL LOW (ref >39.00)
LDL Cholesterol: 55 mg/dL (ref 0–99)
NonHDL: 76.84
TRIGLYCERIDES: 109 mg/dL (ref 0.0–149.0)
Total CHOL/HDL Ratio: 3
VLDL: 21.8 mg/dL (ref 0.0–40.0)

## 2016-07-01 LAB — HEPATIC FUNCTION PANEL
ALK PHOS: 52 U/L (ref 39–117)
ALT: 37 U/L (ref 0–53)
AST: 45 U/L — ABNORMAL HIGH (ref 0–37)
Albumin: 4.3 g/dL (ref 3.5–5.2)
BILIRUBIN DIRECT: 0.3 mg/dL (ref 0.0–0.3)
BILIRUBIN TOTAL: 1.2 mg/dL (ref 0.2–1.2)
TOTAL PROTEIN: 7.1 g/dL (ref 6.0–8.3)

## 2016-07-01 LAB — TSH: TSH: 3.9 u[IU]/mL (ref 0.35–4.50)

## 2016-07-01 LAB — HEMOGLOBIN A1C: Hgb A1c MFr Bld: 5.9 % (ref 4.6–6.5)

## 2016-07-01 MED ORDER — TERBINAFINE HCL 250 MG PO TABS: 250.0000 mg | ORAL_TABLET | Freq: Every day | ORAL | 1 refills | Status: DC
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
## 2016-07-01 NOTE — Patient Instructions (Addendum)
blood tests are requested for you today.  We'll let you know about the results. I have sent a prescription to your pharmacy, for the toenail fungus. Please consider these measures for your health:  minimize alcohol.  Do not use tobacco products.  Have a colonoscopy at least every 10 years from age 71.  Keep firearms safely stored.  Always use seat belts.  have working smoke alarms in your home.  See an eye doctor and dentist regularly.  Never drive under the influence of alcohol or drugs (including prescription drugs).   Please come back for a follow-up appointment in 6 months.

## 2016-07-07 ENCOUNTER — Telehealth: Payer: Self-pay | Admitting: Endocrinology

## 2016-07-14 ENCOUNTER — Telehealth: Payer: Self-pay

## 2016-07-14 NOTE — Telephone Encounter (Signed)
[facility] called wanting to verify if the patient should be taking the Carvedilol 6.25, Lisinopril 5 mg and losartan-hctz 100-25 mg.  Please advise, Thanks!

## 2016-07-14 NOTE — Telephone Encounter (Signed)
Pharmacy notified.

## 2016-07-14 NOTE — Telephone Encounter (Signed)
please d/c lisinopril Others are fine, thanks.

## 2016-07-24 ENCOUNTER — Telehealth: Payer: Self-pay | Admitting: Endocrinology

## 2016-07-24 NOTE — Telephone Encounter (Signed)
Pt called in and said that ever since he was taken off of the Lisinopril, his blood pressure has been high. He wants to know what he should do.

## 2016-07-27 NOTE — Telephone Encounter (Signed)
Patient scheduled for 07/28/2016 at 930 am.

## 2016-07-27 NOTE — Telephone Encounter (Signed)
See message and please advise, Thanks!  

## 2016-07-27 NOTE — Telephone Encounter (Signed)
Ov next few days please

## 2016-07-28 ENCOUNTER — Ambulatory Visit (INDEPENDENT_AMBULATORY_CARE_PROVIDER_SITE_OTHER): Payer: Medicare Other | Admitting: Endocrinology

## 2016-07-28 ENCOUNTER — Encounter: Payer: Self-pay | Admitting: Endocrinology

## 2016-07-28 VITALS — BP 150/92 | HR 83 | Ht 70.0 in | Wt 265.0 lb

## 2016-07-28 DIAGNOSIS — I10 Essential (primary) hypertension: Secondary | ICD-10-CM

## 2016-07-28 MED ORDER — CARVEDILOL PHOSPHATE ER 20 MG PO CP24: 20.0000 mg | ORAL_CAPSULE | Freq: Every day | ORAL | 11 refills | Status: DC

## 2016-07-28 NOTE — Patient Instructions (Addendum)
I have sent a prescription to your pharmacy, to increase the carvedilol.  This new one is a once a day pill. Please continue the same other medications.   Please come back for a follow-up appointment in 6 months.

## 2016-07-28 NOTE — Progress Notes (Signed)
Subjective:    Patient ID: Kurt Blair, male    DOB: June 10, 1945, 71 y.o.   MRN: [id number]  HPI Pt returns for f/u of HTN: he takes meds as rx'ed.  pt states he feels well in general.   Past Medical History:  Diagnosis Date  . ANXIETY 02/16/2007  . DEPRESSION 02/16/2007  . Diverticulosis   . ERECTILE DYSFUNCTION 02/16/2007  . FATTY LIVER DISEASE 06/07/2008  . HYPERGLYCEMIA 02/16/2007  . Hyperlipidemia   . Internal hemorrhoid   . NASH (nonalcoholic steatohepatitis)   . PULMONARY EMBOLISM, HX OF 02/16/2007  . SUBACROMIAL BURSITIS, RIGHT 06/07/2008  . Tubular adenoma of colon 07/2001  . VENOUS INSUFFICIENCY 02/16/2007    Past Surgical History:  Procedure Laterality Date  . COLONOSCOPY    . Drainage fluid from chest  1990  . Leg stripping  07/22/01   Left     Social History   Social History  . Marital status: Married    Spouse name: N/A  . Number of children: N/A  . Years of education: N/A   Occupational History  . Landscaping     retired   Social History Main Topics  . Smoking status: Former Smoker    Packs/day: 0.50    Years: 50.00    Quit date: 05/11/2013  . Smokeless tobacco: Never Used  . Alcohol use 8.4 oz/week    14 Shots of liquor per week     Comment: every night 6oz brandy  . Drug use: No  . Sexual activity: Not on file   Other Topics Concern  . Not on file   Social History Narrative   [name] (who pt and wife raised) died of cancer Jul 22, 2009    Current Outpatient Prescriptions on File Prior to Visit  Medication Sig Dispense Refill  . atorvastatin (LIPITOR) 80 MG tablet TAKE 1 TABLET (80 MG TOTAL) BY MOUTH DAILY. 90 tablet 3  . furosemide (LASIX) 40 MG tablet TAKE 1 TABLET (40 MG TOTAL) BY MOUTH DAILY. 30 tablet 3  . KLOR-CON M20 20 MEQ tablet TAKE 1 TABLET TWICE A DAY 30 tablet 11  . losartan-hydrochlorothiazide (HYZAAR) 100-25 MG tablet Take 1 tablet by mouth daily. 90 tablet 1  . terbinafine (LAMISIL) 250 MG tablet Take 1 tablet (250 mg total) by mouth  daily. 90 tablet 1  . traMADol (ULTRAM) 50 MG tablet Take 1 tablet (50 mg total) by mouth 2 (two) times daily. 60 tablet 1   No current facility-administered medications on file prior to visit.     No Known Allergies  Family History  Problem Relation Age of Onset  . Cancer Mother     uncertain type  . Cancer Sister     Lung Disease  . Colon cancer Neg Hx   . Esophageal cancer Neg Hx   . Rectal cancer Neg Hx   . Stomach cancer Neg Hx   . Prostate cancer Neg Hx   . Pancreatic cancer Neg Hx     BP (!) 150/92   Pulse 83   Ht 5' 10"  (1.778 m)   Wt 265 lb (120.2 kg)   SpO2 94%   BMI 38.02 kg/m   Review of Systems Denies sob.     Objective:   Physical Exam VITAL SIGNS:  See vs page.   GENERAL: no distress.  Ext: 1+ left leg edema (trace on the right).       Assessment & Plan:  HTN: he needs increased rx.  Patient is advised the following:  Patient Instructions  I have sent a prescription to your pharmacy, to increase the carvedilol.  This new one is a once a day pill. Please continue the same other medications.   Please come back for a follow-up appointment in 6 months.

## 2016-07-29 ENCOUNTER — Other Ambulatory Visit: Payer: Self-pay

## 2016-07-29 MED ORDER — CARVEDILOL PHOSPHATE ER 20 MG PO CP24: 20.0000 mg | ORAL_CAPSULE | Freq: Every day | ORAL | 2 refills | Status: DC

## 2016-08-03 ENCOUNTER — Telehealth: Payer: Self-pay | Admitting: Endocrinology

## 2016-08-03 MED ORDER — CARVEDILOL 12.5 MG PO TABS: 12.5000 mg | ORAL_TABLET | Freq: Two times a day (BID) | ORAL | 3 refills | Status: DC

## 2016-08-03 NOTE — Telephone Encounter (Signed)
Pt needs [place] to call him back about the Carvedilol ER called in on the 20th, the insurance is not covering the med. I told him about the message from about an hour ago as seen below pt understands we are waiting on the MD

## 2016-08-03 NOTE — Telephone Encounter (Signed)
[facility] and said the Car[facility]ol ER is not going to be covered under the insurance and wants to know if an alternative can be called in.

## 2016-08-03 NOTE — Telephone Encounter (Signed)
See message and please advise, Thanks!  

## 2016-08-03 NOTE — Telephone Encounter (Signed)
I have sent a prescription to your pharmacy, to change to the twice a day

## 2016-08-03 NOTE — Telephone Encounter (Signed)
I contacted the patient and advised of message. He voiced understanding and had no further questions at this time.

## 2016-08-04 ENCOUNTER — Encounter: Payer: Self-pay | Admitting: Sports Medicine

## 2016-08-04 ENCOUNTER — Ambulatory Visit (INDEPENDENT_AMBULATORY_CARE_PROVIDER_SITE_OTHER): Payer: Medicare Other | Admitting: Sports Medicine

## 2016-08-04 DIAGNOSIS — M1711 Unilateral primary osteoarthritis, right knee: Secondary | ICD-10-CM

## 2016-08-04 MED ORDER — MELOXICAM 15 MG PO TABS: 15.0000 mg | ORAL_TABLET | Freq: Every day | ORAL | 2 refills | Status: AC

## 2016-08-04 NOTE — Progress Notes (Signed)
  Kurt Blair - 71 y.o. male MRN [id number]  Date of birth: [birth year]  SUBJECTIVE:  Including CC & ROS.   Mr. Kurt Blair is a 71 year old male that is following up for right knee pain. He received an injection in January and has been doing well until about 2 weeks ago. He reports the pain is on the medial aspect of his knee. Pain is worse with activity. He is unable to use the tramadol provided because he drinks on a nightly basis. He has not been using any Tylenol. He denies any buckling or giving way. He does not endorse any swelling. He has not tried any compression there is pain with walking.  ROS: No unexpected weight loss, fever, chills, swelling, instability, muscle pain, numbness/tingling, redness, otherwise see HPI    HISTORY: Past Medical, Surgical, Social, and Family History Reviewed & Updated per EMR.   Pertinent Historical Findings include: PSHx -  nightly alcohol use  DATA REVIEWED: Previous ultrasound  PHYSICAL EXAM:  VS: BP:(!) 147/76  HR:61bpm  TEMP: ( )  RESP:   HT:5' 10"  (177.8 cm)   WT:265 lb (120.2 kg)  BMI:38.1 PHYSICAL EXAM: Gen: NAD, alert, cooperative with exam, HEENT: clear conjunctiva, EOMI CV:  no edema, capillary refill brisk,  Resp: non-labored, normal speech Skin: no rashes, normal turgor  Neuro: no gross deficits.  Psych:  alert and oriented Right knee: No obvious effusion he has a varus stance. Some tenderness to palpation over the medial joint line. No endpoints valgus and varus stress testing. Normal flexion and extension. Normal strength. No pain with patellar compression or patellar grind Neurovascularly intact  ASSESSMENT & PLAN:   Osteoarthritis of right knee We will stop the tramadol at this point. Most likely his knee pain is related to his underlying arthritis. He received an injection about 2 months ago so will be early for that. - Start mobic - Provided home exercise therapy. - He can follow-up in 4 weeks to receive another  injection

## 2016-08-06 NOTE — Assessment & Plan Note (Signed)
We will stop the tramadol at this point. Most likely his knee pain is related to his underlying arthritis. He received an injection about 2 months ago so will be early for that. - Start mobic - Provided home exercise therapy. - He can follow-up in 4 weeks to receive another injection

## 2016-08-22 ENCOUNTER — Other Ambulatory Visit: Payer: Self-pay | Admitting: Endocrinology

## 2016-09-10 ENCOUNTER — Ambulatory Visit (INDEPENDENT_AMBULATORY_CARE_PROVIDER_SITE_OTHER): Payer: Medicare Other | Admitting: Sports Medicine

## 2016-09-10 ENCOUNTER — Encounter: Payer: Self-pay | Admitting: Sports Medicine

## 2016-09-10 ENCOUNTER — Ambulatory Visit: Payer: Self-pay

## 2016-09-10 VITALS — BP 155/94 | Ht 70.0 in | Wt 265.0 lb

## 2016-09-10 DIAGNOSIS — M19111 Post-traumatic osteoarthritis, right shoulder: Secondary | ICD-10-CM | POA: Diagnosis not present

## 2016-09-10 DIAGNOSIS — M1711 Unilateral primary osteoarthritis, right knee: Secondary | ICD-10-CM | POA: Diagnosis not present

## 2016-09-10 MED ORDER — METHYLPREDNISOLONE ACETATE 40 MG/ML IJ SUSP
40.0000 mg | Freq: Once | INTRAMUSCULAR | Status: AC
  Administered 2016-09-10: 40 mg via INTRA_ARTICULAR

## 2016-09-10 NOTE — Assessment & Plan Note (Signed)
Procedure:  Injection of RT knee Consent obtained and verified. Time-out conducted. Noted no overlying erythema, induration, or other signs of local infection. Skin prepped in a sterile fashion. Topical analgesic spray: Ethyl chloride. Completed without difficulty.  I injected along medial joinit line and into parameniscal cyst Meds: 1 cc solumedrol 40 and 4 cc lidocaine 1% Pain immediately improved suggesting accurate placement of the medication. Advised to call if fevers/chills, erythema, induration, drainage, or persistent bleeding.  He would like to avoid surgery We will plan injection q 3 mos if needed

## 2016-09-10 NOTE — Progress Notes (Signed)
CC: RT knee pain  Patient with severe pain RT knee We injected him 3 mos ago He got 2 mos of relief Now pain is severe again Ibuprofen helps some  ROS Denies locking Not much swelling Wakens him at night  PE Pleasant older M in NAD BP (!) 155/94   Ht 5' 10"  (1.778 m)   Wt 265 lb (120.2 kg)   BMI 38.02 kg/m   RT knee Puffiness in med. Joint line TPP medial joint Limited flexion extension is -5 deg Pain on rotation  Pain on standing with knee flexion  Ultrasound of RT knee  No significant effusion in suprapatellar pouch Medial meniscus shows a large para-meniscal cyst Marked loss of joint line medially with spurring Lateral meniscus intact Lateral joint line with better space Patellar tendon normal  Impression - para-meniscal cyst and severe degenerative joint disease of medial compartment RT knee  Ultrasound and interpretation by Peterson Ao B. Oneida Alar, MD

## 2016-09-10 NOTE — Assessment & Plan Note (Signed)
He feels he has only mild pain Excellent response to injection

## 2016-09-26 ENCOUNTER — Other Ambulatory Visit: Payer: Self-pay | Admitting: Endocrinology

## 2016-10-28 ENCOUNTER — Other Ambulatory Visit: Payer: Self-pay

## 2016-10-28 MED ORDER — FUROSEMIDE 40 MG PO TABS: ORAL_TABLET | ORAL | 1 refills | Status: DC

## 2016-10-28 MED ORDER — CARVEDILOL 12.5 MG PO TABS: 12.5000 mg | ORAL_TABLET | Freq: Two times a day (BID) | ORAL | 2 refills | Status: DC

## 2016-12-24 ENCOUNTER — Ambulatory Visit (INDEPENDENT_AMBULATORY_CARE_PROVIDER_SITE_OTHER): Payer: Medicare Other | Admitting: Sports Medicine

## 2016-12-24 ENCOUNTER — Ambulatory Visit: Payer: Self-pay

## 2016-12-24 VITALS — BP 148/86 | Ht 70.0 in | Wt 253.0 lb

## 2016-12-24 DIAGNOSIS — M1711 Unilateral primary osteoarthritis, right knee: Secondary | ICD-10-CM

## 2016-12-24 MED ORDER — METHYLPREDNISOLONE ACETATE 40 MG/ML IJ SUSP
40.0000 mg | Freq: Once | INTRAMUSCULAR | Status: AC
  Administered 2016-12-24: 40 mg via INTRA_ARTICULAR

## 2016-12-24 NOTE — Progress Notes (Signed)
CC: RT knee pain f/u  Seen on 5/3 and received injection He got 2 mos of relief For about 3 weeks felt like pain was coming back, for last 1.5 weeks pain has been consistent and severe Not as active due to recent heat wave Not using OTC pain medications  ROS Denies locking, popping, catching, instability Denies swelling  PE Pleasant older M in NAD BP (!) 148/86   Ht 5' 10"  (1.778 m)   Wt 253 lb (114.8 kg)   BMI 36.30 kg/m   RT knee Unclear if effusion present TPP medial knee  Flexion about 120 degrees Extension is -5 deg Ligaments stable Bony hypertrophy on observation  Ultrasound of RT knee  No significant effusion in suprapatellar pouch Medial meniscus shows a large para-meniscal cyst Degenerative change in medial meniscus with calcificaton Marked loss of joint line medially with spurring Lateral meniscus intact Lateral joint line with better space Patellar tendon normal  Impression - para-meniscal cyst and severe degenerative joint disease of medial compartment RT knee  Ultrasound and interpretation by Peterson Ao B. Jaspreet Hollings, MD   A/P Para-meniscal cyst and severe degenerative joint disease of medial compartment RT knee - Will inject knee today - Follow up in 3 months  Procedure:  Injection of RT knee Consent obtained and verified. Time-out conducted. Noted no overlying erythema, induration, or other signs of local infection. Skin prepped in a sterile fashion. Topical analgesic spray: Ethyl chloride. Completed without difficulty.  I injected along medial joinit line and into parameniscal cyst Meds: 1 cc solumedrol 40 and 4 cc lidocaine 1% Pain immediately improved suggesting accurate placement of the medication. Advised to call if fevers/chills, erythema, induration, drainage, or persistent bleeding.  He would like to avoid surgery We will plan injection q 3 mos if needed  I observed and examined the patient with the resident and agree with assessment and  plan.  Note reviewed and modified by me. Stefanie Libel, MD

## 2016-12-25 NOTE — Assessment & Plan Note (Signed)
OA - we are doing OK with pain relief with serial CSI about every 3 months  He has other issues and wishes to avoid surgery  Will continue this approach  Remain active as tolerated

## 2016-12-29 ENCOUNTER — Ambulatory Visit (INDEPENDENT_AMBULATORY_CARE_PROVIDER_SITE_OTHER): Payer: Medicare Other | Admitting: Endocrinology

## 2016-12-29 ENCOUNTER — Encounter: Payer: Self-pay | Admitting: Endocrinology

## 2016-12-29 VITALS — BP 140/72 | HR 65 | Wt 261.8 lb

## 2016-12-29 DIAGNOSIS — R0602 Shortness of breath: Secondary | ICD-10-CM | POA: Diagnosis not present

## 2016-12-29 LAB — BRAIN NATRIURETIC PEPTIDE: Pro B Natriuretic peptide (BNP): 31 pg/mL (ref 0.0–100.0)

## 2016-12-29 LAB — BASIC METABOLIC PANEL
BUN: 20 mg/dL (ref 6–23)
CALCIUM: 9.5 mg/dL (ref 8.4–10.5)
CO2: 34 meq/L — AB (ref 19–32)
Chloride: 100 mEq/L (ref 96–112)
Creatinine, Ser: 1.01 mg/dL (ref 0.40–1.50)
GFR: 93.76 mL/min (ref >60.00)
GLUCOSE: 105 mg/dL — AB (ref 70–99)
Potassium: 3.6 mEq/L (ref 3.5–5.1)
SODIUM: 139 meq/L (ref 135–145)

## 2016-12-29 NOTE — Progress Notes (Signed)
Subjective:    Patient ID: Kurt Blair, male    DOB: 1945/06/13, 71 y.o.   MRN: [id number]  HPI pt reports few mos of slight worsening in chronic doe sensation in the chest, with a flight of stairs.  No assoc cough.  Past Medical History:  Diagnosis Date  . ANXIETY 02/16/2007  . DEPRESSION 02/16/2007  . Diverticulosis   . ERECTILE DYSFUNCTION 02/16/2007  . FATTY LIVER DISEASE 06/07/2008  . HYPERGLYCEMIA 02/16/2007  . Hyperlipidemia   . Internal hemorrhoid   . NASH (nonalcoholic steatohepatitis)   . PULMONARY EMBOLISM, HX OF 02/16/2007  . SUBACROMIAL BURSITIS, RIGHT 06/07/2008  . Tubular adenoma of colon 07/2001  . VENOUS INSUFFICIENCY 02/16/2007    Past Surgical History:  Procedure Laterality Date  . COLONOSCOPY    . Drainage fluid from chest  1990  . Leg stripping  2001-07-19   Left     Social History   Social History  . Marital status: Married    Spouse name: N/A  . Number of children: N/A  . Years of education: N/A   Occupational History  . Landscaping     retired   Social History Main Topics  . Smoking status: Former Smoker    Packs/day: 0.50    Years: 50.00    Quit date: 05/11/2013  . Smokeless tobacco: Never Used  . Alcohol use 8.4 oz/week    14 Shots of liquor per week     Comment: every night 6oz brandy  . Drug use: No  . Sexual activity: Not on file   Other Topics Concern  . Not on file   Social History Narrative   [name] (who pt and wife raised) died of cancer 07/19/2009    Current Outpatient Prescriptions on File Prior to Visit  Medication Sig Dispense Refill  . atorvastatin (LIPITOR) 80 MG tablet TAKE 1 TABLET (80 MG TOTAL) BY MOUTH DAILY. 90 tablet 3  . carvedilol (COREG) 12.5 MG tablet Take 1 tablet (12.5 mg total) by mouth 2 (two) times daily with a meal. 180 tablet 2  . furosemide (LASIX) 40 MG tablet TAKE 1 TABLET (40 MG TOTAL) BY MOUTH DAILY. 90 tablet 1  . KLOR-CON M20 20 MEQ tablet TAKE 1 TABLET TWICE A DAY 30 tablet 11  .  losartan-hydrochlorothiazide (HYZAAR) 100-25 MG tablet TAKE 1 TABLET BY MOUTH DAILY. 90 tablet 3  . meloxicam (MOBIC) 15 MG tablet Take 1 tablet (15 mg total) by mouth daily. 30 tablet 2  . terbinafine (LAMISIL) 250 MG tablet Take 1 tablet (250 mg total) by mouth daily. 90 tablet 1  . traMADol (ULTRAM) 50 MG tablet Take 1 tablet (50 mg total) by mouth 2 (two) times daily. 60 tablet 1   No current facility-administered medications on file prior to visit.     No Known Allergies  Family History  Problem Relation Age of Onset  . Cancer Mother        uncertain type  . Cancer Sister        Lung Disease  . Colon cancer Neg Hx   . Esophageal cancer Neg Hx   . Rectal cancer Neg Hx   . Stomach cancer Neg Hx   . Prostate cancer Neg Hx   . Pancreatic cancer Neg Hx     BP 140/72   Pulse 65   Wt 261 lb 12.8 oz (118.8 kg)   SpO2 94%   BMI 37.56 kg/m    Review of Systems Denies chest pain.  No  change in chronic leg edema    Objective:   Physical Exam VITAL SIGNS:  See vs page GENERAL: no distress LUNGS:  Clear to auscultation HEART:  Regular rate and rhythm without murmurs noted. Normal S1,S2.   Ext: 2+ bilat leg edema.    I personally reviewed spirometry tracing (today):  Indication: doe Impression: mild restriction No comparison is available.   BNP=normal    Assessment & Plan:  Doe, chronic, worse, prob related to obesity.  Edema, worse, unrelated to the above  Patient Instructions  Please continue the same medications.   blood tests are requested for you today.  We'll let you know about the results. Please come back for a follow-up appointment in 3 months.

## 2016-12-29 NOTE — Patient Instructions (Addendum)
Please continue the same medications.   blood tests are requested for you today.  We'll let you know about the results. Please come back for a follow-up appointment in 3 months.

## 2017-04-05 ENCOUNTER — Encounter: Payer: Self-pay | Admitting: Endocrinology

## 2017-04-05 ENCOUNTER — Ambulatory Visit (INDEPENDENT_AMBULATORY_CARE_PROVIDER_SITE_OTHER): Payer: Medicare Other | Admitting: Endocrinology

## 2017-04-05 VITALS — BP 142/90 | HR 77 | Wt 266.8 lb

## 2017-04-05 DIAGNOSIS — Z0000 Encounter for general adult medical examination without abnormal findings: Secondary | ICD-10-CM | POA: Diagnosis not present

## 2017-04-05 DIAGNOSIS — Z23 Encounter for immunization: Secondary | ICD-10-CM

## 2017-04-05 NOTE — Patient Instructions (Addendum)
Please continue the same medications.   Please consider these measures for your health:  minimize alcohol.  Do not use tobacco products.  Have a colonoscopy at least every 10 years from age 71.  Keep firearms safely stored.  Always use seat belts.  have working smoke alarms in your home.  See an eye doctor and dentist regularly.  Never drive under the influence of alcohol or drugs (including prescription drugs).    Please come back for a follow-up appointment in 6 months.

## 2017-04-05 NOTE — Progress Notes (Signed)
Subjective:    Patient ID: Kurt Blair, male    DOB: 12/10/1945, 71 y.o.   MRN: [id number]  HPI Pt is here for regular wellness examination, and is feeling pretty well in general, and says chronic med probs are stable, except as noted below.  Past Medical History:  Diagnosis Date  . ANXIETY 02/16/2007  . DEPRESSION 02/16/2007  . Diverticulosis   . ERECTILE DYSFUNCTION 02/16/2007  . FATTY LIVER DISEASE 06/07/2008  . HYPERGLYCEMIA 02/16/2007  . Hyperlipidemia   . Internal hemorrhoid   . NASH (nonalcoholic steatohepatitis)   . PULMONARY EMBOLISM, HX OF 02/16/2007  . SUBACROMIAL BURSITIS, RIGHT 06/07/2008  . Tubular adenoma of colon 07/2001  . VENOUS INSUFFICIENCY 02/16/2007    Past Surgical History:  Procedure Laterality Date  . COLONOSCOPY    . Drainage fluid from chest  1990  . Leg stripping  2001/07/17   Left     Social History   Socioeconomic History  . Marital status: Married    Spouse name: Not on file  . Number of children: Not on file  . Years of education: Not on file  . Highest education level: Not on file  Social Needs  . Financial resource strain: Not on file  . Food insecurity - worry: Not on file  . Food insecurity - inability: Not on file  . Transportation needs - medical: Not on file  . Transportation needs - non-medical: Not on file  Occupational History  . Occupation: Landscaping    Comment: retired  Tobacco Use  . Smoking status: Former Smoker    Packs/day: 0.50    Years: 50.00    Pack years: 25.00    Last attempt to quit: 05/11/2013    Years since quitting: 3.9  . Smokeless tobacco: Never Used  Substance and Sexual Activity  . Alcohol use: Yes    Alcohol/week: 8.4 oz    Types: 14 Shots of liquor per week    Comment: every night 6oz brandy  . Drug use: No  . Sexual activity: Not on file  Other Topics Concern  . Not on file  Social History Narrative   [name] (who pt and wife raised) died of cancer 07/17/09    Current Outpatient Medications on File  Prior to Visit  Medication Sig Dispense Refill  . atorvastatin (LIPITOR) 80 MG tablet TAKE 1 TABLET (80 MG TOTAL) BY MOUTH DAILY. 90 tablet 3  . carvedilol (COREG) 12.5 MG tablet Take 1 tablet (12.5 mg total) by mouth 2 (two) times daily with a meal. 180 tablet 2  . furosemide (LASIX) 40 MG tablet TAKE 1 TABLET (40 MG TOTAL) BY MOUTH DAILY. 90 tablet 1  . KLOR-CON M20 20 MEQ tablet TAKE 1 TABLET TWICE A DAY 30 tablet 11  . losartan-hydrochlorothiazide (HYZAAR) 100-25 MG tablet TAKE 1 TABLET BY MOUTH DAILY. 90 tablet 3  . meloxicam (MOBIC) 15 MG tablet Take 1 tablet (15 mg total) by mouth daily. 30 tablet 2  . terbinafine (LAMISIL) 250 MG tablet Take 1 tablet (250 mg total) by mouth daily. 90 tablet 1  . traMADol (ULTRAM) 50 MG tablet Take 1 tablet (50 mg total) by mouth 2 (two) times daily. 60 tablet 1   No current facility-administered medications on file prior to visit.     No Known Allergies  Family History  Problem Relation Age of Onset  . Cancer Mother        uncertain type  . Cancer Sister  Lung Disease  . Colon cancer Neg Hx   . Esophageal cancer Neg Hx   . Rectal cancer Neg Hx   . Stomach cancer Neg Hx   . Prostate cancer Neg Hx   . Pancreatic cancer Neg Hx     BP (!) 142/90 (BP Location: Left Arm, Cuff Size: Normal)   Pulse 77   Wt 266 lb 12.8 oz (121 kg)   SpO2 92%   BMI 38.28 kg/m     Review of Systems Denies fever, fatigue, visual loss, hearing loss, chest pain, back pain, depression, cold intolerance, BRBPR, hematuria, syncope, numbness, allergy sxs, easy bruising, and rash.  He has doe.       Objective:   Physical Exam VS: see vs page GEN: no distress HEAD: head: no deformity eyes: no periorbital swelling, no proptosis.  external nose and ears are normal mouth: no lesion seen NECK: supple, thyroid is not enlarged.  CHEST WALL: no deformity.  LUNGS: clear to auscultation BREASTS:  No gynecomastia CV: reg rate and rhythm, no murmur ABD:  abdomen is soft, nontender.  no hepatosplenomegaly.  not distended.  no hernia.   RECTAL/PROSTATE: declined MUSCULOSKELETAL: muscle bulk and strength are grossly normal.  no obvious joint swelling.  gait is normal and steady EXTEMITIES: no deformity.  no ulcer on the feet, but the skin is scaly.  feet are of normal color and temp.  2+ bilat leg edema.  There is bilateral onychomycosis of the toenails PULSES: dorsalis pedis intact bilat.  no carotid bruit NEURO:  cn 2-12 grossly intact.   readily moves all 4's.  sensation is intact to touch on the feet. SKIN:  Normal texture and temperature.  No rash or suspicious lesion is visible.   NODES:  None palpable at the neck PSYCH: alert, well-oriented.  Does not appear anxious nor depressed.    I personally reviewed electrocardiogram tracing (today): Indication: HTN Impression: NSR with 1st D AVB.  No MI.  No hypertrophy.   Compared to 2017: no significant change.       Assessment & Plan:  Wellness visit today, with problems stable, except as noted.    Patient Instructions  Please continue the same medications.   Please consider these measures for your health:  minimize alcohol.  Do not use tobacco products.  Have a colonoscopy at least every 10 years from age 57.  Keep firearms safely stored.  Always use seat belts.  have working smoke alarms in your home.  See an eye doctor and dentist regularly.  Never drive under the influence of alcohol or drugs (including prescription drugs).    Please come back for a follow-up appointment in 6 months.

## 2017-04-06 ENCOUNTER — Encounter: Payer: Self-pay | Admitting: Sports Medicine

## 2017-04-06 ENCOUNTER — Ambulatory Visit: Payer: Self-pay

## 2017-04-06 ENCOUNTER — Ambulatory Visit: Payer: Medicare Other | Admitting: Sports Medicine

## 2017-04-06 VITALS — BP 141/81 | Ht 70.0 in | Wt 260.0 lb

## 2017-04-06 DIAGNOSIS — M1711 Unilateral primary osteoarthritis, right knee: Secondary | ICD-10-CM

## 2017-04-06 DIAGNOSIS — M25561 Pain in right knee: Secondary | ICD-10-CM | POA: Diagnosis not present

## 2017-04-06 MED ORDER — METHYLPREDNISOLONE ACETATE 40 MG/ML IJ SUSP
40.0000 mg | Freq: Once | INTRAMUSCULAR | Status: AC
  Administered 2017-04-06: 40 mg via INTRA_ARTICULAR

## 2017-04-06 NOTE — Progress Notes (Signed)
Chief complaint: Follow-up chronic right knee pain  History of present illness: Kurt Blair is a 71 year old male who presents to the sports medicine office today for follow-up of right knee pain. 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 He was last seen here in the office back on 12/16/16. At that time, he did receive cortisone injection into the right knee under ultrasound guidance. He reports that he did have 2 months of complete resolution of pain, now symptoms are starting to return. He points pain being on the medial aspect of his right knee. He does report of occasional mechanical symptoms include popping, locking, catching, symptoms of giving way. He does have known large perimeniscal cyst that was seen in the medial meniscus. He wants to manage this nonsurgically. He does not report of any worsening of symptoms. He does not report of any warmth, erythema, ecchymosis, or effusion. He does not report of any fevers, chills, night sweats, or any unintentional weight loss.  Review of systems:  As stated above  Interval past medical history, surgical history, family history, and social history obtained and unchanged.  Physical exam: Vital signs are reviewed and are documented in the chart Gen.: Alert, oriented, appears stated age, in no apparent distress HEENT: Moist oral mucosa Respiratory: Normal respirations, able to speak in full sentences Cardiac: Regular rate, distal pulses 2+ Integumentary: No rashes on visible skin:  Neurologic: Strength 5/5, sensation 2+ in bilateral lower extremities Psych: Normal affect, mood is described as good Musculoskeletal: Inspection of right knee reveals no obvious deformity or muscle atrophy, no warmth, erythema, ecchymosis, or effusion, he is tender to palpation over the medial joint line, Lachman, anterior drawer, valgus, varus stress testing negative, McMurray positive for  crepitus, negative for pain, range of motion today is from 0 to 120  Ultrasound of the right knee was performed in the office today. Key findings of the ultrasound revealed that he does have no significant effusion in suprapatellar pouch. He does have redemonstration of medial meniscus degenerative changes and calcification. The large perimeniscal cyst that was present previously in August is gone now. Unremarkable lateral meniscus and lateral joint line.  Procedure: Injection of RT knee Consent obtained and verified. Time-out conducted. Noted no overlying erythema, induration, or other signs of local infection. Skin prepped in a sterile fashion. Topical analgesic spray: Ethyl chloride. Completed without difficulty. I injected along medial joint line where the most notable effusion was Meds: 1 cc solumedrol 40 and 4 cc lidocaine 1% Pain immediately improved suggesting accurate placement of the medication. Advised to call if fevers/chills, erythema, induration, drainage, or persistent bleeding.  Assessment and plan: 1. Chronic right knee pain, with known severe DJD of right knee with previous large perimeniscal cyst that has now resolved  Plan: He would like to proceed with repeat cortisone injection into his right knee today. This was done with ultrasound guidance to mark site of injection. Again, it was redemonstrated today he has no significant suprapatellar effusion. Fortunately, perimeniscal cyst is now absent. He did report that he did get significant improvement with injection into the medial joint line. This was done today without any complications noted. Discussed cryotherapy and relative rest today. Discussed he can have cortisone injection every 3 months if needed. Again, he would like to avoid surgery if possible.   Mort Sawyers, M.D. [place]

## 2017-04-17 ENCOUNTER — Other Ambulatory Visit: Payer: Self-pay | Admitting: Endocrinology

## 2017-07-07 ENCOUNTER — Encounter: Payer: Self-pay | Admitting: Sports Medicine

## 2017-07-07 ENCOUNTER — Ambulatory Visit: Payer: Medicare Other | Admitting: Sports Medicine

## 2017-07-07 DIAGNOSIS — M19011 Primary osteoarthritis, right shoulder: Secondary | ICD-10-CM | POA: Diagnosis not present

## 2017-07-07 DIAGNOSIS — M1711 Unilateral primary osteoarthritis, right knee: Secondary | ICD-10-CM

## 2017-07-07 MED ORDER — METHYLPREDNISOLONE ACETATE 40 MG/ML IJ SUSP
40.0000 mg | Freq: Once | INTRAMUSCULAR | Status: AC
  Administered 2017-07-07: 40 mg via INTRA_ARTICULAR

## 2017-07-07 NOTE — Assessment & Plan Note (Signed)
CSI today  Cont to work on ROM  Can repeat CSI in 3 mos as needed

## 2017-07-07 NOTE — Progress Notes (Signed)
Chronic RT knee pain/ RT shoulder pain  Patient with known Grade 4 DJD of RT knee We have been doing serial injections Q 3 mos These really hep First injection helped 10 weeks Injection on last visit tried along ML of medial joint Only gave 6 weeks relief Difficulty walking and staying active now 2/2 knee  RT shoulder GH and AC joint DJD noted 1/18 visit Injection helped for past year Motion has been stiff but improved for > 6 wks post injection  ROS Night pain RT sholuler Increased stiffness RT shoulder  RT nee very painful on steps No locking Pain makes hjim stumble at times  PE General: Older AA male in NAD Ht 5' 10"  (1.778 m)   Wt 255 lb (115.7 kg)   BMI 36.59 kg/m   RT kinee with some chronic bony hypertrophy TTP along medial joint line Good extension Flexion is painful No warmth or significant swelling  RT shoulder Limited abduction and elevation on RT Unable to do back scratch above hips Good overall strength  Procedure:  Injection of RT shoulder Consent obtained and verified. Time-out conducted. Noted no overlying erythema, induration, or other signs of local infection. Skin prepped in a sterile fashion. Topical analgesic spray: Ethyl chloride. Completed without difficulty.  Post. Approach. Meds: 1 cc Solumdrol 40 and 4 cc 1% lidocaine Pain immediately improved suggesting accurate placement of the medication. Advised to call if fevers/chills, erythema, induration, drainage, or persistent bleeding.   Procedure:  Injection of RT knee Consent obtained and verified. Time-out conducted. Noted no overlying erythema, induration, or other signs of local infection. Skin prepped in a sterile fashion. Topical analgesic spray: Ethyl chloride. Completed without difficulty..  Seated position along medial joint line anterior 1/3 Meds: 1 cc Solumedrol + 4 cc lidocaine 1% Pain immediately improved suggesting accurate placement of the medication. Advised to call if  fevers/chills, erythema, induration, drainage, or persistent bleeding.

## 2017-07-07 NOTE — Assessment & Plan Note (Signed)
On current plan of q 3 mo injection  This keeps him functional  Not wanting to do surgery or take more meds

## 2017-07-08 ENCOUNTER — Ambulatory Visit: Payer: Medicare Other | Admitting: Sports Medicine

## 2017-07-11 ENCOUNTER — Other Ambulatory Visit: Payer: Self-pay | Admitting: Endocrinology

## 2017-07-14 ENCOUNTER — Other Ambulatory Visit: Payer: Self-pay | Admitting: Endocrinology

## 2017-07-14 ENCOUNTER — Other Ambulatory Visit: Payer: Self-pay

## 2017-07-14 MED ORDER — FUROSEMIDE 40 MG PO TABS: ORAL_TABLET | ORAL | 2 refills | Status: DC

## 2017-08-05 ENCOUNTER — Other Ambulatory Visit: Payer: Self-pay | Admitting: Endocrinology

## 2017-09-21 ENCOUNTER — Other Ambulatory Visit: Payer: Self-pay | Admitting: Endocrinology

## 2017-09-21 ENCOUNTER — Telehealth: Payer: Self-pay | Admitting: Endocrinology

## 2017-09-21 NOTE — Telephone Encounter (Signed)
Patient stated that he has recently been having shortness of breath. He is not sure when his next physical is due but would like a call back to speak to someone about his SOB    Please advise [phone]

## 2017-09-21 NOTE — Telephone Encounter (Signed)
Patient called back & feels this was not an emergency. He wants to come in at 8:15 tomorrow. I am getting [place] to open & approve 8:15 tomorrow.

## 2017-09-21 NOTE — Telephone Encounter (Signed)
If in pt's opinion, this can wait for tomorrow, 8:15 tomorrow AM.  If not, go to ER now.

## 2017-09-22 ENCOUNTER — Ambulatory Visit
Admission: RE | Admit: 2017-09-22 | Discharge: 2017-09-22 | Disposition: A | Discharge status: Home / Self Care | Payer: Medicare Other | Source: Ambulatory Visit | Attending: Endocrinology | Admitting: Endocrinology

## 2017-09-22 ENCOUNTER — Encounter: Payer: Self-pay | Admitting: Endocrinology

## 2017-09-22 ENCOUNTER — Ambulatory Visit (INDEPENDENT_AMBULATORY_CARE_PROVIDER_SITE_OTHER): Payer: Medicare Other | Admitting: Endocrinology

## 2017-09-22 VITALS — BP 152/86 | HR 76 | Ht 70.0 in | Wt 268.0 lb

## 2017-09-22 DIAGNOSIS — Z125 Encounter for screening for malignant neoplasm of prostate: Secondary | ICD-10-CM

## 2017-09-22 DIAGNOSIS — R0602 Shortness of breath: Secondary | ICD-10-CM | POA: Diagnosis not present

## 2017-09-22 DIAGNOSIS — I10 Essential (primary) hypertension: Secondary | ICD-10-CM | POA: Diagnosis not present

## 2017-09-22 DIAGNOSIS — R739 Hyperglycemia, unspecified: Secondary | ICD-10-CM | POA: Diagnosis not present

## 2017-09-22 LAB — CBC WITH DIFFERENTIAL/PLATELET
BASOS ABS: 0 10*3/uL (ref 0.0–0.1)
Basophils Relative: 0.6 % (ref 0.0–3.0)
EOS ABS: 0.2 10*3/uL (ref 0.0–0.7)
Eosinophils Relative: 4.7 % (ref 0.0–5.0)
HEMATOCRIT: 43.3 % (ref 39.0–52.0)
Hemoglobin: 14.8 g/dL (ref 13.0–17.0)
LYMPHS PCT: 33.2 % (ref 12.0–46.0)
Lymphs Abs: 1.7 10*3/uL (ref 0.7–4.0)
MCHC: 34.2 g/dL (ref 30.0–36.0)
MCV: 88.5 fl (ref 78.0–100.0)
Monocytes Absolute: 0.4 10*3/uL (ref 0.1–1.0)
Monocytes Relative: 7 % (ref 3.0–12.0)
Neutro Abs: 2.8 10*3/uL (ref 1.4–7.7)
Neutrophils Relative %: 54.5 % (ref 43.0–77.0)
Platelets: 216 10*3/uL (ref 150.0–400.0)
RBC: 4.89 Mil/uL (ref 4.22–5.81)
RDW: 15 % (ref 11.5–15.5)
WBC: 5.1 10*3/uL (ref 4.0–10.5)

## 2017-09-22 LAB — BASIC METABOLIC PANEL
BUN: 11 mg/dL (ref 6–23)
CHLORIDE: 97 meq/L (ref 96–112)
CO2: 35 mEq/L — ABNORMAL HIGH (ref 19–32)
CREATININE: 0.98 mg/dL (ref 0.40–1.50)
Calcium: 9.1 mg/dL (ref 8.4–10.5)
GFR: 96.87 mL/min (ref >60.00)
Glucose, Bld: 105 mg/dL — ABNORMAL HIGH (ref 70–99)
Potassium: 4.4 mEq/L (ref 3.5–5.1)
Sodium: 139 mEq/L (ref 135–145)

## 2017-09-22 LAB — URINALYSIS, ROUTINE W REFLEX MICROSCOPIC
Bilirubin Urine: NEGATIVE
Hgb urine dipstick: NEGATIVE
Ketones, ur: NEGATIVE
Leukocytes, UA: NEGATIVE
Nitrite: NEGATIVE
RBC / HPF: NONE SEEN (ref 0–?)
Specific Gravity, Urine: 1.015 (ref 1.000–1.030)
Urine Glucose: NEGATIVE
Urobilinogen, UA: 1 (ref 0.0–1.0)
pH: 7 (ref 5.0–8.0)

## 2017-09-22 LAB — LIPID PANEL
CHOLESTEROL: 115 mg/dL (ref 0–200)
HDL: 42.8 mg/dL (ref >39.00)
LDL CALC: 48 mg/dL (ref 0–99)
NonHDL: 71.97
TRIGLYCERIDES: 121 mg/dL (ref 0.0–149.0)
Total CHOL/HDL Ratio: 3
VLDL: 24.2 mg/dL (ref 0.0–40.0)

## 2017-09-22 LAB — HEPATIC FUNCTION PANEL
ALBUMIN: 3.8 g/dL (ref 3.5–5.2)
ALK PHOS: 45 U/L (ref 39–117)
ALT: 11 U/L (ref 0–53)
AST: 17 U/L (ref 0–37)
Bilirubin, Direct: 0.2 mg/dL (ref 0.0–0.3)
TOTAL PROTEIN: 6.7 g/dL (ref 6.0–8.3)
Total Bilirubin: 0.8 mg/dL (ref 0.2–1.2)

## 2017-09-22 LAB — HEMOGLOBIN A1C: Hgb A1c MFr Bld: 5.9 % (ref 4.6–6.5)

## 2017-09-22 LAB — TSH: TSH: 5.33 u[IU]/mL — ABNORMAL HIGH (ref 0.35–4.50)

## 2017-09-22 LAB — PSA: PSA: 1.46 ng/mL (ref 0.10–4.00)

## 2017-09-22 NOTE — Progress Notes (Signed)
Subjective:    Patient ID: Kurt Blair, male    DOB: [birth year], 72 y.o.   MRN: [id number]  HPI Few weeks of slight dyspnea sensation in the chest, but no assoc wheezing.  He had PE's in the 1990's.   Past Medical History:  Diagnosis Date  . ANXIETY 02/16/2007  . DEPRESSION 02/16/2007  . Diverticulosis   . ERECTILE DYSFUNCTION 02/16/2007  . FATTY LIVER DISEASE 06/07/2008  . HYPERGLYCEMIA 02/16/2007  . Hyperlipidemia   . Internal hemorrhoid   . NASH (nonalcoholic steatohepatitis)   . PULMONARY EMBOLISM, HX OF 02/16/2007  . SUBACROMIAL BURSITIS, RIGHT 06/07/2008  . Tubular adenoma of colon 07/2001  . VENOUS INSUFFICIENCY 02/16/2007    Past Surgical History:  Procedure Laterality Date  . COLONOSCOPY    . Drainage fluid from chest  1990  . Leg stripping  Jul 28, 2001   Left     Social History   Socioeconomic History  . Marital status: Married    Spouse name: Not on file  . Number of children: Not on file  . Years of education: Not on file  . Highest education level: Not on file  Occupational History  . 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 Financial resource strain: Not on file  . Food insecurity:    Worry: Not on file    Inability: Not on file  . Transportation needs:    Medical: Not on file    Non-medical: Not on file  Tobacco Use  . Smoking status: Former Smoker    Packs/day: 0.50    Years: 50.00    Pack years: 25.00    Last attempt to quit: 05/11/2013    Years since quitting: 4.3  . Smokeless tobacco: Never Used  Substance and Sexual Activity  . Alcohol use: Yes    Alcohol/week: 8.4 oz    Types: 14 Shots of liquor per week    Comment: every night 6oz brandy  . Drug use: No  . Sexual activity: Not on file  Lifestyle  . Physical activity:    Days per week: Not on file    Minutes per session: Not on file  . Stress: Not on file  Relationships  . Social connections:    Talks on phone: Not on file    Gets together: Not on file    Attends  religious service: Not on file    Active member of club or organization: Not on file    Attends meetings of clubs or organizations: Not on file    Relationship status: Not on file  . Intimate partner violence:    Fear of current or ex partner: Not on file    Emotionally abused: Not on file    Physically abused: Not on file    Forced sexual activity: Not on file  Other Topics Concern  . Not on file  Social History Narrative   [name] (who pt and wife raised) died of cancer 2009-07-28    Current Outpatient Medications on File Prior to Visit  Medication Sig Dispense Refill  . atorvastatin (LIPITOR) 80 MG tablet TAKE 1 TABLET (80 MG TOTAL) BY MOUTH DAILY. 90 tablet 3  . carvedilol (COREG) 12.5 MG tablet TAKE 1 TABLET (12.5 MG TOTAL) BY MOUTH 2 (TWO) TIMES DAILY WITH A MEAL. 180 tablet 2  . furosemide (LASIX) 40 MG tablet TAKE 1 TABLET BY MOUTH EVERY DAY 90 tablet 2  . KLOR-CON M20 20 MEQ tablet TAKE 1 TABLET TWICE A  DAY 180 tablet 2  . losartan-hydrochlorothiazide (HYZAAR) 100-25 MG tablet TAKE 1 TABLET BY MOUTH DAILY. 90 tablet 3  . terbinafine (LAMISIL) 250 MG tablet Take 1 tablet (250 mg total) by mouth daily. 90 tablet 1  . traMADol (ULTRAM) 50 MG tablet Take 1 tablet (50 mg total) by mouth 2 (two) times daily. 60 tablet 1   No current facility-administered medications on file prior to visit.     No Known Allergies  Family History  Problem Relation Age of Onset  . Cancer Mother        uncertain type  . Cancer Sister        Lung Disease  . Colon cancer Neg Hx   . Esophageal cancer Neg Hx   . Rectal cancer Neg Hx   . Stomach cancer Neg Hx   . Prostate cancer Neg Hx   . Pancreatic cancer Neg Hx     BP (!) 152/86 (BP Location: Left Arm, Patient Position: Sitting, Cuff Size: Large)   Pulse 76   Ht 5' 10"  (1.778 m)   Wt 268 lb (121.6 kg)   SpO2 (!) 86%   BMI 38.45 kg/m    Review of Systems Denies cough, chest pain, and weight change    Objective:   Physical Exam VITAL  SIGNS:  See vs page GENERAL: no distress LUNGS:  Clear to auscultation  I personally reviewed spirometry tracing (today): Indication: doe Impression: no obstruction or restriction.  normal No comparison is available     Assessment & Plan:  DOE, new, uncertain etiology. HTN: poss situational.  Recheck next time.    Patient Instructions  blood tests are requested for you today.  We'll let you know about the results. If these are normal, we'll check an "echo" (easy and painless heart test).

## 2017-09-22 NOTE — Patient Instructions (Addendum)
blood tests are requested for you today.  We'll let you know about the results. If these are normal, we'll check an "echo" (easy and painless heart test).

## 2017-09-23 ENCOUNTER — Telehealth: Payer: Self-pay

## 2017-09-23 LAB — D-DIMER, QUANTITATIVE: D-Dimer, Quant: 0.53 mcg/mL FEU — ABNORMAL HIGH (ref <0.50)

## 2017-09-23 NOTE — Telephone Encounter (Signed)
I contacted the patient and advised via voicemail if he was still experiencing SOB to report the the ER. Patient was advised we would call as soon as we receive the blood results.

## 2017-09-23 NOTE — Telephone Encounter (Signed)
We hope to know the results tomorrow.  However, if you continue to be sob, please go to ER.

## 2017-09-23 NOTE — Telephone Encounter (Signed)
Patient called in regarding the labs from 09/22/2017. Pt was advised of the mychart results listed and was told the D-Dimer test has not been resulted yet. Patient requested once this lab is resulted that a nurse or cma call him to advise of the test. Patient does not want this result sent to him via mychart. Thanks!

## 2017-09-24 ENCOUNTER — Other Ambulatory Visit: Payer: Self-pay

## 2017-09-24 ENCOUNTER — Emergency Department (HOSPITAL_COMMUNITY): Payer: Medicare Other

## 2017-09-24 ENCOUNTER — Telehealth: Payer: Self-pay

## 2017-09-24 ENCOUNTER — Emergency Department (HOSPITAL_COMMUNITY)
Admission: EM | Admit: 2017-09-24 | Discharge: 2017-09-25 | Disposition: A | Discharge status: Home / Self Care | Payer: Medicare Other | Attending: Emergency Medicine | Admitting: Emergency Medicine

## 2017-09-24 ENCOUNTER — Encounter (HOSPITAL_COMMUNITY): Payer: Self-pay

## 2017-09-24 DIAGNOSIS — Z79899 Other long term (current) drug therapy: Secondary | ICD-10-CM | POA: Insufficient documentation

## 2017-09-24 DIAGNOSIS — R05 Cough: Secondary | ICD-10-CM | POA: Diagnosis not present

## 2017-09-24 DIAGNOSIS — R0600 Dyspnea, unspecified: Secondary | ICD-10-CM | POA: Diagnosis not present

## 2017-09-24 DIAGNOSIS — E039 Hypothyroidism, unspecified: Secondary | ICD-10-CM | POA: Insufficient documentation

## 2017-09-24 DIAGNOSIS — Z86711 Personal history of pulmonary embolism: Secondary | ICD-10-CM | POA: Insufficient documentation

## 2017-09-24 DIAGNOSIS — R0602 Shortness of breath: Secondary | ICD-10-CM | POA: Diagnosis not present

## 2017-09-24 LAB — CBC WITH DIFFERENTIAL/PLATELET
Abs Immature Granulocytes: 0 10*3/uL (ref 0.0–0.1)
Basophils Absolute: 0 10*3/uL (ref 0.0–0.1)
Basophils Relative: 1 %
Eosinophils Absolute: 0.3 10*3/uL (ref 0.0–0.7)
Eosinophils Relative: 4 %
HEMATOCRIT: 43.1 % (ref 39.0–52.0)
Hemoglobin: 14.8 g/dL (ref 13.0–17.0)
IMMATURE GRANULOCYTES: 0 %
LYMPHS ABS: 2 10*3/uL (ref 0.7–4.0)
Lymphocytes Relative: 28 %
MCH: 29.4 pg (ref 26.0–34.0)
MCHC: 34.3 g/dL (ref 30.0–36.0)
MCV: 85.7 fL (ref 78.0–100.0)
MONO ABS: 0.6 10*3/uL (ref 0.1–1.0)
MONOS PCT: 9 %
NEUTROS PCT: 58 %
Neutro Abs: 4.1 10*3/uL (ref 1.7–7.7)
Platelets: 233 10*3/uL (ref 150–400)
RBC: 5.03 MIL/uL (ref 4.22–5.81)
RDW: 13.8 % (ref 11.5–15.5)
WBC: 7.1 10*3/uL (ref 4.0–10.5)

## 2017-09-24 LAB — BASIC METABOLIC PANEL
ANION GAP: 8 (ref 5–15)
BUN: 11 mg/dL (ref 6–20)
CALCIUM: 9 mg/dL (ref 8.9–10.3)
CO2: 32 mmol/L (ref 22–32)
Chloride: 99 mmol/L — ABNORMAL LOW (ref 101–111)
Creatinine, Ser: 1.18 mg/dL (ref 0.61–1.24)
GFR calc Af Amer: >60 mL/min (ref >60)
GLUCOSE: 94 mg/dL (ref 65–99)
POTASSIUM: 3.7 mmol/L (ref 3.5–5.1)
Sodium: 139 mmol/L (ref 135–145)

## 2017-09-24 NOTE — Telephone Encounter (Signed)
Patient called asking for results of D-Dimer which have not yet come back. I stated that if I received them before then end of the day we would give him a call back. He asked we call him at [phone].

## 2017-09-24 NOTE — ED Triage Notes (Signed)
Pt c.o SOB on exertion for the past three days. Pt went to PCP and they sent him here to rule out a blood clot due to pts hx of DVT "about 25 years ago". Pt does not take any blood thinners. Pt c.o chest tightness, denies cough, fever/chills.

## 2017-09-25 ENCOUNTER — Other Ambulatory Visit: Payer: Self-pay

## 2017-09-25 ENCOUNTER — Emergency Department (HOSPITAL_COMMUNITY): Payer: Medicare Other

## 2017-09-25 DIAGNOSIS — R0602 Shortness of breath: Secondary | ICD-10-CM | POA: Diagnosis not present

## 2017-09-25 LAB — BRAIN NATRIURETIC PEPTIDE: B Natriuretic Peptide: 18.9 pg/mL (ref 0.0–100.0)

## 2017-09-25 LAB — I-STAT TROPONIN, ED: TROPONIN I, POC: 0 ng/mL (ref 0.00–0.08)

## 2017-09-25 MED ORDER — IOPAMIDOL (ISOVUE-370) INJECTION 76%
100.0000 mL | Freq: Once | INTRAVENOUS | Status: AC | PRN
  Administered 2017-09-25: 100 mL via INTRAVENOUS

## 2017-09-25 MED ORDER — IOPAMIDOL (ISOVUE-370) INJECTION 76%
INTRAVENOUS | Status: AC
  Filled 2017-09-25: qty 100

## 2017-09-25 NOTE — ED Provider Notes (Signed)
[facility] EMERGENC[facility] DEPARTMENT Provider Note   CSN: [id number] Arrival date & time: 09/24/17  1735     History   Chief Complaint Chief Complaint  Patient presents with  . Shortness of Breath    HPI Kurt Blair is a 72 y.o. male.  72 year old male presents with shortness of breath which began 3 days ago.  Has some dyspnea on exertion which is since resolved.  Had no associated chest pain or chest pressure.  Saw his physician and d-dimer was performed which came back elevated at 0.53.  Patient has no new leg pain.  Does have chronic intermittent edema of his lower extremities.  She does have a prior history of DVT some time ago.  No cough congestion.  No fever or chills.  No treatment used prior to arrival     Past Medical History:  Diagnosis Date  . ANXIETY 02/16/2007  . DEPRESSION 02/16/2007  . Diverticulosis   . ERECTILE DYSFUNCTION 02/16/2007  . FATTY LIVER DISEASE 06/07/2008  . HYPERGLYCEMIA 02/16/2007  . Hyperlipidemia   . Internal hemorrhoid   . NASH (nonalcoholic steatohepatitis)   . PULMONARY EMBOLISM, HX OF 02/16/2007  . SUBACROMIAL BURSITIS, RIGHT 06/07/2008  . Tubular adenoma of colon 07/2001  . VENOUS INSUFFICIENCY 02/16/2007    Patient Active Problem List   Diagnosis Date Noted  . 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 Osteoarthritis of right knee 06/04/2016  . Knee pain, bilateral 04/28/2016  . Hypokalemia 04/26/2015  . HTN (hypertension) 04/14/2015  . Rectal bleeding 03/28/2015  . Hypothyroidism 11/13/2014  . Screening for prostate cancer 06/03/2012  . Routine general medical examination at a health care facility 06/03/2012  . ABDOMINAL PAIN, UNSPECIFIED SITE 07/25/2009  . Nonspecific (abnormal) findings on radiological and other examination of body structure 07/25/2009  . ABNORMAL CHEST XRAY 07/25/2009  . CHEST PAIN 12/12/2008  . ALCOHOL USE 06/07/2008  . NASH (nonalcoholic steatohepatitis) 06/07/2008  .  SUBACROMIAL BURSITIS, RIGHT 06/07/2008  . DYSPNEA 06/07/2008  . Leukocytopenia 02/16/2007  . ANXIETY 02/16/2007  . ERECTILE DYSFUNCTION 02/16/2007  . SMOKER 02/16/2007  . DEPRESSION 02/16/2007  . VENOUS INSUFFICIENCY 02/16/2007  . Hyperglycemia 02/16/2007  . PULMONARY EMBOLISM, HX OF 02/16/2007  . TUBULAR ADENOMAS, COLON 09/28/2001    Past Surgical History:  Procedure Laterality Date  . COLONOSCOPY    . Drainage fluid from chest  1990  . Leg stripping  2003   Left         Home Medications    Prior to Admission medications   Medication Sig Start Date End Date Taking? Authorizing Provider  atorvastatin (LIPITOR) 80 MG tablet TAKE 1 TABLET (80 MG TOTAL) BY MOUTH DAILY. 04/17/17   Renato Shin, MD  carvedilol (COREG) 12.5 MG tablet TAKE 1 TABLET (12.5 MG TOTAL) BY MOUTH 2 (TWO) TIMES DAILY WITH A MEAL. 08/05/17   Renato Shin, MD  furosemide (LASIX) 40 MG tablet TAKE 1 TABLET BY MOUTH EVERY DAY 07/14/17   Renato Shin, MD  KLOR-CON M20 20 MEQ tablet TAKE 1 TABLET TWICE A DAY 07/14/17   Renato Shin, MD  losartan-hydrochlorothiazide (HYZAAR) 100-25 MG tablet TAKE 1 TABLET BY MOUTH DAILY. 09/21/17   Renato Shin, MD  terbinafine (LAMISIL) 250 MG tablet Take 1 tablet (250 mg total) by mouth daily. 07/01/16   Renato Shin, MD  traMADol (ULTRAM) 50 MG tablet Take 1 tablet (50 mg total) by mouth 2 (two) times daily. 06/04/16   Stefanie Libel, MD    Family History Family  History  Problem Relation Age of Onset  . Cancer Mother        uncertain type  . Cancer Sister        Lung Disease  . Colon cancer Neg Hx   . Esophageal cancer Neg Hx   . Rectal cancer Neg Hx   . Stomach cancer Neg Hx   . Prostate cancer Neg Hx   . Pancreatic cancer Neg Hx     Social History Social History   Tobacco Use  . Smoking status: Former Smoker    Packs/day: 0.50    Years: 50.00    Pack years: 25.00    Last attempt to quit: 05/11/2013    Years since quitting: 4.3  . Smokeless tobacco: Never Used    Substance Use Topics  . Alcohol use: Yes    Alcohol/week: 8.4 oz    Types: 14 Shots of liquor per week    Comment: every night 6oz brandy  . Drug use: No     Allergies   Patient has no known allergies.   Review of Systems Review of Systems  All other systems reviewed and are negative.    Physical Exam Updated Vital Signs BP (!) 173/87 (BP Location: Right Arm)   Pulse (!) 51   Temp 98 F (36.7 C) (Oral)   Resp 19   Ht 1.778 m (5' 10" )   Wt 121.6 kg (268 lb)   SpO2 95%   BMI 38.45 kg/m   Physical Exam  Constitutional: He is oriented to person, place, and time. He appears well-developed and well-nourished.  Non-toxic appearance. No distress.  HENT:  Head: Normocephalic and atraumatic.  Eyes: Pupils are equal, round, and reactive to light. Conjunctivae, EOM and lids are normal.  Neck: Normal range of motion. Neck supple. No tracheal deviation present. No thyroid mass present.  Cardiovascular: Normal rate, regular rhythm and normal heart sounds. Exam reveals no gallop.  No murmur heard. Pulmonary/Chest: Effort normal and breath sounds normal. No stridor. No respiratory distress. He has no decreased breath sounds. He has no wheezes. He has no rhonchi. He has no rales.  Abdominal: Soft. Normal appearance and bowel sounds are normal. He exhibits no distension. There is no tenderness. There is no rebound and no CVA tenderness.  Musculoskeletal: Normal range of motion. He exhibits no edema or tenderness.  Right lower extremity with 3+ pitting edema.  Neurological: He is alert and oriented to person, place, and time. He has normal strength. No cranial nerve deficit or sensory deficit. GCS eye subscore is 4. GCS verbal subscore is 5. GCS motor subscore is 6.  Skin: Skin is warm and dry. No abrasion and no rash noted.  Psychiatric: He has a normal mood and affect. His speech is normal and behavior is normal.  Nursing note and vitals reviewed.    ED Treatments / Results   Labs (all labs ordered are listed, but only abnormal results are displayed) Labs Reviewed  BASIC METABOLIC PANEL - Abnormal; Notable for the following components:      Result Value   Chloride 99 (*)    All other components within normal limits  CBC WITH DIFFERENTIAL/PLATELET  BRAIN NATRIURETIC PEPTIDE  TROPONIN I  BRAIN NATRIURETIC PEPTIDE  I-STAT TROPONIN, ED    EKG EKG Interpretation  Date/Time:  Friday Sep 24 2017 18:48:07 EDT Ventricular Rate:  64 PR Interval:  212 QRS Duration: 82 QT Interval:  440 QTC Calculation: 453 R Axis:   10 Text Interpretation:  Sinus rhythm  with 1st degree A-V block Otherwise normal ECG No significant change since last tracing Confirmed by Lacretia Leigh ([id number]) on 09/25/2017 11:54:30 AM   Radiology Dg Chest 2 View  Result Date: 09/24/2017 CLINICAL DATA:  Pt c.o SOB on exertion for the past three days. Pt went to PCP and they sent him here to rule out a blood clot due to pts hx of DVT "about 25 years ago". Pt does not take any blood thinners. Pt c.o chest tightness, denies cough, .*comment was truncated* EXAM: CHEST - 2 VIEW COMPARISON:  09/22/2017 FINDINGS: Normal cardiac silhouette with ectatic aorta. Chronic elevation LEFT hemidiaphragm. No effusion, infiltrate, pneumothorax. No pulmonary edema. Degenerative osteophytosis of the spine. IMPRESSION: No acute cardiopulmonary process. Electronically Signed   By: Suzy Bouchard M.D.   On: 09/24/2017 20:25    Procedures Procedures (including critical care time)  Medications Ordered in ED Medications - No data to display   Initial Impression / Assessment and Plan / ED Course  I have reviewed the triage vital signs and the nursing notes.  Pertinent labs & imaging results that were available during my care of the patient were reviewed by me and considered in my medical decision making (see chart for details).  Clinical Course as of Sep 25 1208  Sat Sep 25, 2017  1108 Noted that the patient's  Troponin I and BNP had not yet resulted from last night. Spoke with Marisue Brooklyn in main lab regarding patient's pending Troponin I and BNP. States they never received them. They will have to be redrawn at this point.  I also noted patient had a positive D-dimer on 5/15 without a noted CTA of the chest. This order was placed.   [SJ]    Clinical Course User Index [SJ] Joy, Shawn C, PA-C    Chest CT is negative for PE.  He is not hypoxemic here.  He is stable for discharge  Final Clinical Impressions(s) / ED Diagnoses   Final diagnoses:  None    ED Discharge Orders    None       Lacretia Leigh, MD 09/25/17 1517

## 2017-09-25 NOTE — ED Notes (Signed)
Patient verbalizes understanding of discharge instructions. Opportunity for questioning and answers were provided. Armband removed by staff, pt discharged from ED.  

## 2017-09-27 ENCOUNTER — Telehealth: Payer: Self-pay | Admitting: Endocrinology

## 2017-09-27 NOTE — Telephone Encounter (Signed)
Should I make patient follow up? Please advise?

## 2017-09-27 NOTE — Telephone Encounter (Signed)
Tomorrow, 3:45 PM

## 2017-09-27 NOTE — Telephone Encounter (Signed)
Patient scheduled tomorrow ar 11:15.

## 2017-09-27 NOTE — Telephone Encounter (Signed)
Patient would like a call back to discuss about labs, He was told to go to the ER due to possibility of him having blood clots. He stated he went and did not have blood clots. He was told to follow up with Dr Loanne Drilling about this.  Please advise.      [phone]

## 2017-09-28 ENCOUNTER — Ambulatory Visit (INDEPENDENT_AMBULATORY_CARE_PROVIDER_SITE_OTHER): Payer: Medicare Other | Admitting: Endocrinology

## 2017-09-28 ENCOUNTER — Encounter: Payer: Self-pay | Admitting: Endocrinology

## 2017-09-28 VITALS — BP 138/82 | HR 76 | Wt 266.0 lb

## 2017-09-28 DIAGNOSIS — E039 Hypothyroidism, unspecified: Secondary | ICD-10-CM | POA: Diagnosis not present

## 2017-09-28 DIAGNOSIS — R0602 Shortness of breath: Secondary | ICD-10-CM

## 2017-09-28 NOTE — Patient Instructions (Addendum)
let's check an "echo" (easy and painless heart test).  you will receive a phone call, about a day and time for an appointment.   I hope you feel better soon.  If you don't feel better by next week, please call back.  Please call sooner if you get worse.

## 2017-09-28 NOTE — Progress Notes (Signed)
Subjective:    Patient ID: Kurt Blair, male    DOB: [birth year], 72 y.o.   MRN: [id number]  HPI Pt was seen 1 week ago for slight sob sensation in the chest.  Pt says it is slightly improved since he was in ER.   Past Medical History:  Diagnosis Date  . ANXIETY 02/16/2007  . DEPRESSION 02/16/2007  . Diverticulosis   . ERECTILE DYSFUNCTION 02/16/2007  . FATTY LIVER DISEASE 06/07/2008  . HYPERGLYCEMIA 02/16/2007  . Hyperlipidemia   . Internal hemorrhoid   . NASH (nonalcoholic steatohepatitis)   . PULMONARY EMBOLISM, HX OF 02/16/2007  . SUBACROMIAL BURSITIS, RIGHT 06/07/2008  . Tubular adenoma of colon 07/2001  . VENOUS INSUFFICIENCY 02/16/2007    Past Surgical History:  Procedure Laterality Date  . COLONOSCOPY    . Drainage fluid from chest  1990  . Leg stripping  07/13/2001   Left     Social History   Socioeconomic History  . Marital status: Married    Spouse name: Not on file  . Number of children: Not on file  . Years of education: Not on file  . Highest education level: Not on file  Occupational History  . 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 Financial resource strain: Not on file  . Food insecurity:    Worry: Not on file    Inability: Not on file  . Transportation needs:    Medical: Not on file    Non-medical: Not on file  Tobacco Use  . Smoking status: Former Smoker    Packs/day: 0.50    Years: 50.00    Pack years: 25.00    Last attempt to quit: 05/11/2013    Years since quitting: 4.3  . Smokeless tobacco: Never Used  Substance and Sexual Activity  . Alcohol use: Yes    Alcohol/week: 8.4 oz    Types: 14 Shots of liquor per week    Comment: every night 6oz brandy  . Drug use: No  . Sexual activity: Not on file  Lifestyle  . Physical activity:    Days per week: Not on file    Minutes per session: Not on file  . Stress: Not on file  Relationships  . Social connections:    Talks on phone: Not on file    Gets together: Not on file    Attends religious service: Not on file    Active member of club or organization: Not on file    Attends meetings of clubs or organizations: Not on file    Relationship status: Not on file  . Intimate partner violence:    Fear of current or ex partner: Not on file    Emotionally abused: Not on file    Physically abused: Not on file    Forced sexual activity: Not on file  Other Topics Concern  . Not on file  Social History Narrative   [name] (who pt and wife raised) died of cancer Jul 13, 2009    Current Outpatient Medications on File Prior to Visit  Medication Sig Dispense Refill  . atorvastatin (LIPITOR) 80 MG tablet TAKE 1 TABLET (80 MG TOTAL) BY MOUTH DAILY. 90 tablet 3  . carvedilol (COREG) 12.5 MG tablet TAKE 1 TABLET (12.5 MG TOTAL) BY MOUTH 2 (TWO) TIMES DAILY WITH A MEAL. 180 tablet 2  . furosemide (LASIX) 40 MG tablet TAKE 1 TABLET BY MOUTH EVERY DAY 90 tablet 2  . KLOR-CON M20 20 MEQ tablet TAKE  1 TABLET TWICE A DAY 180 tablet 2  . losartan-hydrochlorothiazide (HYZAAR) 100-25 MG tablet TAKE 1 TABLET BY MOUTH DAILY. 90 tablet 3  . traMADol (ULTRAM) 50 MG tablet Take 1 tablet (50 mg total) by mouth 2 (two) times daily. 60 tablet 1   No current facility-administered medications on file prior to visit.     Allergies  Allergen Reactions  . Latex Hives and Rash    Family History  Problem Relation Age of Onset  . Cancer Mother        uncertain type  . Cancer Sister        Lung Disease  . Colon cancer Neg Hx   . Esophageal cancer Neg Hx   . Rectal cancer Neg Hx   . Stomach cancer Neg Hx   . Prostate cancer Neg Hx   . Pancreatic cancer Neg Hx     BP 138/82   Pulse 76   Wt 266 lb (120.7 kg)   SpO2 93%   BMI 38.17 kg/m    Review of Systems Denies chest pain.     Objective:   Physical Exam VITAL SIGNS:  See vs page GENERAL: no distress LUNGS:  Clear to auscultation HEART:  Regular rate and rhythm without murmurs noted. Normal S1,S2.        Assessment & Plan:    Dyspnea: persistent. elev D-dimer: CT was neg for PE.    Patient Instructions  let's check an "echo" (easy and painless heart test).  you will receive a phone call, about a day and time for an appointment.   I hope you feel better soon.  If you don't feel better by next week, please call back.  Please call sooner if you get worse.

## 2017-10-05 ENCOUNTER — Ambulatory Visit: Payer: Medicare Other | Admitting: Sports Medicine

## 2017-10-05 DIAGNOSIS — M19011 Primary osteoarthritis, right shoulder: Secondary | ICD-10-CM | POA: Diagnosis not present

## 2017-10-05 DIAGNOSIS — M1711 Unilateral primary osteoarthritis, right knee: Secondary | ICD-10-CM | POA: Diagnosis not present

## 2017-10-05 MED ORDER — METHYLPREDNISOLONE ACETATE 40 MG/ML IJ SUSP
40.0000 mg | Freq: Once | INTRAMUSCULAR | Status: AC
  Administered 2017-10-05: 40 mg via INTRA_ARTICULAR

## 2017-10-05 NOTE — Progress Notes (Signed)
    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 Rarely gives out - no radiation of pain  - denies any swelling of the joint - most painful with ambulation   Right Shoulder Pain:  - has known DH and AC joint DJD with last injection done 07/07/17 (SA bursa)  - painful to do over the head movements of his right arm. Pain is mainly in the lateral shoulder  - no radiation of pain down the arm  - improved with injection completed in February   ROS: See HPI.  [place]:  PMH, Surgical history, social history, Allergies reviewed   PHYSICAL EXAM: BP 132/79   Ht 5' 10"  (1.778 m)   Wt 260 lb (117.9 kg)   BMI 37.31 kg/m  Gen: NAD  Right Shoulder:  Inspection reveals no abnormalities, atrophy or asymmetry. Palpation: pain over AC joint. No pain over bicipital groove. ROM is limited: internal rotation is limited to the level of the lateral hip on the right. Abduction and flexion is limited to about 110 degrees. Passive external rotation is normal.  Rotator cuff strength: weakness and pain with supraspinatus testing using empty can. Otherwise normal but does have discomfort with resisted external rotation.   Signs of impingement with positive Neer and Hawkin's tests, empty can.  Right Knee: Normal to inspection with no erythema or effusion but does have obvious bony hypertrophy. Crepitus noted.  Palpation with no warmth or joint line tenderness. There is patellar tenderness ROM: painful with flexion, normal extension of the lower leg  Patellar and quadriceps tendons unremarkable.  ASSESSMENT/PLAN:  Right Knee OA:  Procedure:  Injection of RT knee Consent obtained and verified. Time-out conducted. Noted no overlying erythema, induration, or other signs of local infection. Skin  prepped in a sterile fashion. Topical analgesic spray: Ethyl chloride. Completed without difficulty.  Seated position along lateral joint line  Meds: 1 cc Solumedrol + 4 cc lidocaine 1% Pain immediately improved suggesting accurate placement of the medication. Advised to call if fevers/chills, erythema, induration, drainage, or persistent bleeding.  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 Time-out conducted. Noted no overlying erythema, induration, or other signs of local infection. Skin prepped in a sterile fashion. Topical analgesic spray: Ethyl chloride. Completed without difficulty.  Post. Approach. Meds: 1 cc Solumdrol 40 and 4 cc 1% lidocaine Pain immediately improved suggesting accurate placement of the medication. Advised to call if fevers/chills, erythema, induration, drainage, or persistent bleeding.  Follow up in 3 months for repeat injections as this keeps patient functional.    Smiley Houseman, MD PGY 3 [place]  I observed and examined the patient with the resident and agree with assessment and plan.  Note reviewed and modified by me. Grace Bushy

## 2017-10-06 NOTE — Assessment & Plan Note (Signed)
Return of night pain  For CSI today

## 2017-10-06 NOTE — Assessment & Plan Note (Signed)
For serial injection to medial joint line

## 2017-10-08 ENCOUNTER — Ambulatory Visit (HOSPITAL_COMMUNITY): Payer: Medicare Other | Attending: Endocrinology

## 2017-10-08 ENCOUNTER — Other Ambulatory Visit: Payer: Self-pay

## 2017-10-08 DIAGNOSIS — E669 Obesity, unspecified: Secondary | ICD-10-CM | POA: Insufficient documentation

## 2017-10-08 DIAGNOSIS — R0602 Shortness of breath: Secondary | ICD-10-CM | POA: Diagnosis not present

## 2017-10-08 DIAGNOSIS — Z6837 Body mass index (BMI) 37.0-37.9, adult: Secondary | ICD-10-CM | POA: Insufficient documentation

## 2017-10-08 DIAGNOSIS — E785 Hyperlipidemia, unspecified: Secondary | ICD-10-CM | POA: Diagnosis not present

## 2017-10-08 DIAGNOSIS — Z87891 Personal history of nicotine dependence: Secondary | ICD-10-CM | POA: Diagnosis not present

## 2017-10-08 DIAGNOSIS — R0600 Dyspnea, unspecified: Secondary | ICD-10-CM | POA: Insufficient documentation

## 2017-10-08 MED ORDER — PERFLUTREN LIPID MICROSPHERE
1.0000 mL | INTRAVENOUS | Status: AC | PRN
  Administered 2017-10-08: 3 mL via INTRAVENOUS

## 2017-10-11 ENCOUNTER — Telehealth: Payer: Self-pay | Admitting: Endocrinology

## 2017-10-11 NOTE — Telephone Encounter (Signed)
Patient is calling for the results of his echo cardiogram  [phone]

## 2017-10-12 ENCOUNTER — Telehealth: Payer: Self-pay | Admitting: Endocrinology

## 2017-10-12 NOTE — Telephone Encounter (Signed)
Please advise on below  

## 2017-10-12 NOTE — Telephone Encounter (Signed)
I put results on my chart

## 2017-10-12 NOTE — Telephone Encounter (Signed)
Here is the my chart message:  Written by Renato Shin, MD on 10/08/2017 7:45 PM  Hi Mr. Ramsburg:  Looks good. Please call if your symptoms persist.

## 2017-10-12 NOTE — Telephone Encounter (Signed)
Patient does not use mychart & I see no note on results. Please advise?

## 2017-10-12 NOTE — Telephone Encounter (Signed)
I called and let patient know results.

## 2017-10-12 NOTE — Telephone Encounter (Signed)
Patient is calling to get the results of his echocardiogram. Please advise

## 2017-10-20 ENCOUNTER — Telehealth: Payer: Self-pay | Admitting: Endocrinology

## 2017-10-20 NOTE — Telephone Encounter (Signed)
He did not have a blood clot on his CT scan.  Please see if he can be seen by on-call primary care

## 2017-10-20 NOTE — Telephone Encounter (Signed)
I advised to patient to contact on call at primary care & if no one could see him, then urgent care. I stated that if symptoms worsened to go to ER. He stated that he'd had some dizziness & SOB.

## 2017-10-20 NOTE — Telephone Encounter (Signed)
Patient stated that he is having same symptoms from his last visit. He stated that he is concerned about a blood clot  Please advise

## 2017-10-20 NOTE — Telephone Encounter (Signed)
Yes, I will see him.

## 2017-10-20 NOTE — Telephone Encounter (Signed)
Copied from [place] [id number]. Topic: Inquiry >> Oct 20, 2017  3:33 PM Pricilla Handler wrote: Reason for CRM: Patient's wife Mardene Celeste called wanting to know if her PCP (Dr. Ronnald Ramp) would consider seeing this patient for a visit. Patient's PCP is Renato Shin, and he is out of town. Patient's wife wants Dr. Ronnald Ramp to see the patient for a one time visit due to patient's symptoms of Lightheadedness and SOB. I asked if someone in Dr. Cordelia Pen office would be willing to see the patient, as he is not established with Dr. Ronnald Ramp. Patient's wife said no, because the patient is not a diabetic. I offered our triage nurse also for the patient, but patient's wife refused. Please call patient's wife at [phone].

## 2017-10-21 NOTE — Telephone Encounter (Signed)
I have already advised.

## 2017-10-21 NOTE — Telephone Encounter (Signed)
He did not have clot on CT.  As I am out of the office, he should see someone (another site or urgent care) about his symptoms.

## 2017-10-22 NOTE — Telephone Encounter (Signed)
APPT MADE

## 2017-11-09 ENCOUNTER — Ambulatory Visit (INDEPENDENT_AMBULATORY_CARE_PROVIDER_SITE_OTHER): Payer: Medicare Other | Admitting: Internal Medicine

## 2017-11-09 ENCOUNTER — Encounter: Payer: Self-pay | Admitting: Internal Medicine

## 2017-11-09 VITALS — BP 134/78 | HR 66 | Temp 99.0°F | Resp 16 | Ht 70.0 in | Wt 268.8 lb

## 2017-11-09 DIAGNOSIS — I5030 Unspecified diastolic (congestive) heart failure: Secondary | ICD-10-CM

## 2017-11-09 DIAGNOSIS — E039 Hypothyroidism, unspecified: Secondary | ICD-10-CM | POA: Diagnosis not present

## 2017-11-09 DIAGNOSIS — I10 Essential (primary) hypertension: Secondary | ICD-10-CM | POA: Diagnosis not present

## 2017-11-09 MED ORDER — OLMESARTAN MEDOXOMIL 20 MG PO TABS: 20.0000 mg | ORAL_TABLET | Freq: Every day | ORAL | 1 refills | Status: DC

## 2017-11-09 NOTE — Patient Instructions (Signed)

## 2017-11-09 NOTE — Progress Notes (Signed)
Subjective:  Patient ID: Kurt Blair, male    DOB: 02/23/1946  Age: 72 y.o. MRN: [id number]  CC: Hypertension and Hypothyroidism  NEW TO ME   HPI BARTHOLOMEW Blair presents for f/up - He was seen in the ED about 6 weeks ago for shortness of breath.  He underwent a CT angio of the chest which was unremarkable.  He also had an echocardiogram done that revealed grade 1 diastolic dysfunction.  He has subsequently been taking 2 diuretics, a loop diuretic, and a thiazide diuretic.  He complains of orthostatic dizziness, lightheadedness, and near syncope.  He wants to make a change in his medications.  He has chronic lower extremity nonpitting edema that does not bother him and has not recently worsened.  Outpatient Medications Prior to Visit  Medication Sig Dispense Refill  . atorvastatin (LIPITOR) 80 MG tablet TAKE 1 TABLET (80 MG TOTAL) BY MOUTH DAILY. 90 tablet 3  . carvedilol (COREG) 12.5 MG tablet TAKE 1 TABLET (12.5 MG TOTAL) BY MOUTH 2 (TWO) TIMES DAILY WITH A MEAL. 180 tablet 2  . furosemide (LASIX) 40 MG tablet TAKE 1 TABLET BY MOUTH EVERY DAY 90 tablet 2  . KLOR-CON M20 20 MEQ tablet TAKE 1 TABLET TWICE A DAY 180 tablet 2  . traMADol (ULTRAM) 50 MG tablet Take 1 tablet (50 mg total) by mouth 2 (two) times daily. 60 tablet 1  . losartan-hydrochlorothiazide (HYZAAR) 100-25 MG tablet TAKE 1 TABLET BY MOUTH DAILY. 90 tablet 3   No facility-administered medications prior to visit.     ROS Review of Systems  Constitutional: Negative for diaphoresis and fatigue.  HENT: Negative.   Eyes: Negative.  Negative for visual disturbance.  Respiratory: Negative for cough, chest tightness, shortness of breath and wheezing.   Cardiovascular: Positive for leg swelling. Negative for chest pain and palpitations.  Gastrointestinal: Negative for abdominal pain, constipation, diarrhea, nausea and vomiting.  Endocrine: Negative.  Negative for polydipsia, polyphagia and polyuria.  Genitourinary: Negative.   Negative for difficulty urinating.  Musculoskeletal: Negative.  Negative for arthralgias and myalgias.  Skin: Negative.  Negative for color change, pallor and rash.  Allergic/Immunologic: Negative.   Neurological: Positive for dizziness and light-headedness. Negative for syncope and weakness.  Hematological: Negative for adenopathy. Does not bruise/bleed easily.    Objective:  BP 134/78 (BP Location: Left Arm, Patient Position: Sitting, Cuff Size: Large)   Pulse 66   Temp 99 F (37.2 C) (Oral)   Resp 16   Ht 5' 10"  (1.778 m)   Wt 268 lb 12 oz (121.9 kg)   SpO2 95%   BMI 38.56 kg/m   BP Readings from Last 3 Encounters:  11/09/17 134/78  10/05/17 132/79  09/28/17 138/82    Wt Readings from Last 3 Encounters:  11/09/17 268 lb 12 oz (121.9 kg)  10/05/17 260 lb (117.9 kg)  09/28/17 266 lb (120.7 kg)    Physical Exam  Constitutional: He is oriented to person, place, and time. No distress.  HENT:  Mouth/Throat: Oropharynx is clear and moist. No oropharyngeal exudate.  Eyes: Conjunctivae are normal. No scleral icterus.  Neck: Normal range of motion. Neck supple. No JVD present.  Cardiovascular: Normal rate, regular rhythm and normal heart sounds. Exam reveals no gallop.  No murmur heard. Pulmonary/Chest: Effort normal and breath sounds normal. No respiratory distress. He has no wheezes. He has no rales.  Abdominal: Soft. Normal appearance and bowel sounds are normal. He exhibits no mass. There is no hepatosplenomegaly. There is  no tenderness. No hernia.  Musculoskeletal: Normal range of motion. He exhibits edema. He exhibits no deformity.  +non-pitting edema in BLE  Lymphadenopathy:    He has no cervical adenopathy.  Neurological: He is alert and oriented to person, place, and time.  Skin: Skin is warm and dry. No rash noted. He is not diaphoretic.  Vitals reviewed.   Lab Results  Component Value Date   WBC 7.1 09/24/2017   HGB 14.8 09/24/2017   HCT 43.1 09/24/2017    PLT 233 09/24/2017   GLUCOSE 94 09/24/2017   CHOL 115 09/22/2017   TRIG 121.0 09/22/2017   HDL 42.80 09/22/2017   LDLDIRECT 144.8 12/26/2010   LDLCALC 48 09/22/2017   ALT 11 09/22/2017   AST 17 09/22/2017   NA 139 09/24/2017   K 3.7 09/24/2017   CL 99 (L) 09/24/2017   CREATININE 1.18 09/24/2017   BUN 11 09/24/2017   CO2 32 09/24/2017   TSH 5.33 (H) 09/22/2017   PSA 1.46 09/22/2017   HGBA1C 5.9 09/22/2017    Dg Chest 2 View  Result Date: 09/24/2017 CLINICAL DATA:  Pt c.o SOB on exertion for the past three days. Pt went to PCP and they sent him here to rule out a blood clot due to pts hx of DVT "about 25 years ago". Pt does not take any blood thinners. Pt c.o chest tightness, denies cough, .*comment was truncated* EXAM: CHEST - 2 VIEW COMPARISON:  09/22/2017 FINDINGS: Normal cardiac silhouette with ectatic aorta. Chronic elevation LEFT hemidiaphragm. No effusion, infiltrate, pneumothorax. No pulmonary edema. Degenerative osteophytosis of the spine. IMPRESSION: No acute cardiopulmonary process. Electronically Signed   By: Suzy Bouchard M.D.   On: 09/24/2017 20:25   Ct Angio Chest Pe W And/or Wo Contrast  Result Date: 09/25/2017 CLINICAL DATA:  Shortness of breath with elevated D-dimer. Evaluate for pulmonary embolism. EXAM: CT ANGIOGRAPHY CHEST WITH CONTRAST TECHNIQUE: Multidetector CT imaging of the chest was performed using the standard protocol during bolus administration of intravenous contrast. Multiplanar CT image reconstructions and MIPs were obtained to evaluate the vascular anatomy. CONTRAST:  100 mL ISOVUE-370 IOPAMIDOL (ISOVUE-370) INJECTION 76% COMPARISON:  None. FINDINGS: Cardiovascular: Mild cardiomegaly. There is calcified plaque over the lateral circumflex and left anterior descending coronary arteries. Thoracic aorta is within normal. Pulmonary arterial system is normal without emboli. Mediastinum/Nodes: No evidence of mediastinal or hilar adenopathy. Remaining mediastinal  structures are within normal. Lungs/Pleura: Lungs are well inflated without focal airspace consolidation or effusion. Subtle bibasilar linear atelectasis. Tiny calcified granuloma over the posterior right lower lobe. Airways are within normal. Upper Abdomen: No acute findings. Musculoskeletal: Degenerative change of the spine. Review of the MIP images confirms the above findings. IMPRESSION: No evidence of pulmonary embolism. No acute cardiopulmonary disease. Mild cardiomegaly with atherosclerotic coronary disease. Electronically Signed   By: Marin Olp M.D.   On: 09/25/2017 15:03    Assessment & Plan:   Lisa was seen today for hypertension and hypothyroidism.  Diagnoses and all orders for this visit:  Acquired hypothyroidism- This is managed by endocrinology.  Essential hypertension- He is symptomatic taking the combination of the loop and thiazide diuretics.  I think he needs the loop diuretic for lower extremity edema but I have asked him to stop taking the thiazide diuretic.  I will also upgrade him to a more potent ARB. -     olmesartan (BENICAR) 20 MG tablet; Take 1 tablet (20 mg total) by mouth daily.  CHF with left ventricular diastolic dysfunction, NYHA  class 1 ([place])- as above -     olmesartan (BENICAR) 20 MG tablet; Take 1 tablet (20 mg total) by mouth daily.   I have discontinued Rush Farmer. Hooser's losartan-hydrochlorothiazide. I am also having him start on olmesartan. Additionally, I am having him maintain his traMADol, atorvastatin, KLOR-CON M20, furosemide, and carvedilol.  Meds ordered this encounter  Medications  . olmesartan (BENICAR) 20 MG tablet    Sig: Take 1 tablet (20 mg total) by mouth daily.    Dispense:  90 tablet    Refill:  1     Follow-up: Return in about 3 months (around 02/09/2018).  Scarlette Calico, MD

## 2017-11-17 ENCOUNTER — Institutional Professional Consult (permissible substitution): Payer: Medicare Other | Admitting: Internal Medicine

## 2017-11-19 ENCOUNTER — Other Ambulatory Visit (INDEPENDENT_AMBULATORY_CARE_PROVIDER_SITE_OTHER): Payer: Medicare Other

## 2017-11-19 ENCOUNTER — Ambulatory Visit (INDEPENDENT_AMBULATORY_CARE_PROVIDER_SITE_OTHER): Payer: Medicare Other | Admitting: Internal Medicine

## 2017-11-19 ENCOUNTER — Encounter: Payer: Self-pay | Admitting: Internal Medicine

## 2017-11-19 VITALS — BP 152/80 | HR 66 | Ht 70.0 in | Wt 269.0 lb

## 2017-11-19 DIAGNOSIS — R0609 Other forms of dyspnea: Secondary | ICD-10-CM

## 2017-11-19 DIAGNOSIS — E039 Hypothyroidism, unspecified: Secondary | ICD-10-CM

## 2017-11-19 DIAGNOSIS — R0902 Hypoxemia: Secondary | ICD-10-CM | POA: Diagnosis not present

## 2017-11-19 LAB — BASIC METABOLIC PANEL
BUN: 15 mg/dL (ref 6–23)
CO2: 32 mEq/L (ref 19–32)
Calcium: 9.7 mg/dL (ref 8.4–10.5)
Chloride: 101 mEq/L (ref 96–112)
Creatinine, Ser: 0.98 mg/dL (ref 0.40–1.50)
GFR: 96.83 mL/min (ref >60.00)
GLUCOSE: 106 mg/dL — AB (ref 70–99)
Potassium: 3.9 mEq/L (ref 3.5–5.1)
SODIUM: 140 meq/L (ref 135–145)

## 2017-11-19 LAB — CBC WITH DIFFERENTIAL/PLATELET
BASOS ABS: 0 10*3/uL (ref 0.0–0.1)
Basophils Relative: 0.7 % (ref 0.0–3.0)
Eosinophils Absolute: 0.3 10*3/uL (ref 0.0–0.7)
Eosinophils Relative: 4.5 % (ref 0.0–5.0)
HEMATOCRIT: 42.5 % (ref 39.0–52.0)
Hemoglobin: 14.8 g/dL (ref 13.0–17.0)
LYMPHS PCT: 30.5 % (ref 12.0–46.0)
Lymphs Abs: 1.7 10*3/uL (ref 0.7–4.0)
MCHC: 34.7 g/dL (ref 30.0–36.0)
MCV: 88.6 fl (ref 78.0–100.0)
Monocytes Absolute: 0.6 10*3/uL (ref 0.1–1.0)
Monocytes Relative: 10.1 % (ref 3.0–12.0)
NEUTROS ABS: 3 10*3/uL (ref 1.4–7.7)
Neutrophils Relative %: 54.2 % (ref 43.0–77.0)
PLATELETS: 212 10*3/uL (ref 150.0–400.0)
RBC: 4.79 Mil/uL (ref 4.22–5.81)
RDW: 14.7 % (ref 11.5–15.5)
WBC: 5.5 10*3/uL (ref 4.0–10.5)

## 2017-11-19 LAB — BRAIN NATRIURETIC PEPTIDE: Pro B Natriuretic peptide (BNP): 30 pg/mL (ref 0.0–100.0)

## 2017-11-19 LAB — TSH: TSH: 5.16 u[IU]/mL — ABNORMAL HIGH (ref 0.35–4.50)
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
## 2017-11-19 NOTE — Patient Instructions (Addendum)
Please see patient coordinator before you leave today  to schedule V/Q lung scan  And venous dopplers   Please remember to go to the lab  department downstairs in the basement  for your tests - we will call you with the results when they are available.   Please schedule a follow up office visit in 4 weeks, sooner if needed with all medications in hand to double check them  - PFT's on return

## 2017-11-20 ENCOUNTER — Encounter: Payer: Self-pay | Admitting: Internal Medicine

## 2017-11-20 DIAGNOSIS — R0902 Hypoxemia: Secondary | ICD-10-CM | POA: Insufficient documentation

## 2017-11-20 NOTE — Assessment & Plan Note (Addendum)
Onset 09/2017 - Spirometry  09/22/17   FEV1 2.09 (73%)  Ratio 69 with early portion of f/v not physiologic  - CTa 09/25/17  No evidence of pulmonary embolism. No acute cardiopulmonary disease. Mild cardiomegaly with atherosclerotic coronary disease. - Echo 2d  08/03/69  G 1 diastolic dysfunction with Mild LAE  And  PAS 34   - 11/19/2017   Walked RA  2 laps @ 185 ft each stopped due to  Sob with sats 85% moderate pace      pt does appear to have difficult to sort out respiratory symptoms/findings of unknown origin for which  DDX  = almost all start with A and  include Adherence, Ace Inhibitors, Acid Reflux, Active Sinus Disease, Alpha 1 Antitripsin deficiency, Anxiety masquerading as Airways dz,  ABPA,  Allergy(esp in young), Aspiration (esp in elderly), Adverse effects of meds,  Active smokers, A bunch of PE's/clot burden (a few small clots can't cause this syndrome unless there is already severe underlying pulm or vascular dz with poor reserve),  Anemia or thyroid disorder, plus two Bs  = Bronchiectasis and Beta blocker use..and one C= CHF    Adherence is always the initial "prime suspect" and is a multilayered concern that requires a "trust but verify" approach in every patient - starting with knowing how to use medications, especially inhalers, correctly, keeping up with refills and understanding the fundamental difference between maintenance and prns vs those medications only taken for a very short course and then stopped and not refilled.  - req return with all meds in hand using a trust but verify approach to confirm accurate Medication  Reconciliation The principal here is that until we are certain that the  patients are doing what we've asked, it makes no sense to ask them to do more.   ? Allergy/ asthma > he does have mild eos elevation and min airflow obst on pfts but no cough or noct symptoms to suggest asthma or assoc rhinitis so less likely here   ? A bunch of PE's >  He could still have   CTEPH despite CTa findings > V/q and venous dopplers need to be done to complete the w/u   ? Anemia/thyroid dz > anemia ruled out but TSH still up > defer rx to PCP  ?anxiety/depression/ deconditioning assoc with wt gain > the latter suggested by HC03 32(indicating mild chronic resp acidosis/ hypoventilation)  but this could also be related to hypothyroidism which needs to be corrected then pt return for full pfts next   ?BB effect > he does have very mild copd and may consider change to more selective BB on return but ok for now  ? CHF >  Grade 1 diastolic dysfunction only

## 2017-11-20 NOTE — Assessment & Plan Note (Addendum)
desat to 85% 11/19/2017 moderate pace   He may have element of OHS with poor ventilatory drive due to hypothyroid  But this should have improved with exertion so  w/u in progress for CTEPH with f/u pfts planned.  ? Also may have underlying evolving hepatopulmonary syndrome?   For now suggested paced ex and repeat study at f/ with pfts and consider supplemental 02 at that point.      Total time devoted to counseling  > 50 % of initial 60 min office visit:  review case with pt/ discussion of options/alternatives/ personally creating written customized instructions  in presence of pt  then going over those specific  Instructions directly with the pt including how to use all of the meds but in particular covering each new medication in detail and the difference between the maintenance= "automatic" meds and the prns using an action plan format for the latter (If this problem/symptom => do that organization reading Left to right).  Please see AVS from this visit for a full list of these instructions which I personally wrote for this pt and  are unique to this visit.

## 2017-11-20 NOTE — Assessment & Plan Note (Signed)
Not optimally controlled on present regimen. I reviewed this with the patient and emphasized importance of follow-up with primary care.     

## 2017-11-22 NOTE — Progress Notes (Signed)
Spoke with pt and notified of results per Dr. [name]. Pt verbalized understanding and denied any questions. 

## 2017-11-25 ENCOUNTER — Ambulatory Visit (HOSPITAL_COMMUNITY)
Admission: RE | Admit: 2017-11-25 | Discharge: 2017-11-25 | Disposition: A | Discharge status: Home / Self Care | Payer: Medicare Other | Source: Ambulatory Visit | Attending: Internal Medicine | Admitting: Internal Medicine

## 2017-11-25 ENCOUNTER — Ambulatory Visit (HOSPITAL_BASED_OUTPATIENT_CLINIC_OR_DEPARTMENT_OTHER)
Admission: RE | Admit: 2017-11-25 | Discharge: 2017-11-25 | Disposition: A | Discharge status: Home / Self Care | Payer: Medicare Other | Source: Ambulatory Visit | Attending: Internal Medicine | Admitting: Internal Medicine

## 2017-11-25 ENCOUNTER — Telehealth: Payer: Self-pay | Admitting: Internal Medicine

## 2017-11-25 DIAGNOSIS — R0609 Other forms of dyspnea: Secondary | ICD-10-CM | POA: Insufficient documentation

## 2017-11-25 DIAGNOSIS — R0602 Shortness of breath: Secondary | ICD-10-CM | POA: Diagnosis not present

## 2017-11-25 DIAGNOSIS — J984 Other disorders of lung: Secondary | ICD-10-CM | POA: Insufficient documentation

## 2017-11-25 MED ORDER — TECHNETIUM TC 99M DIETHYLENETRIAME-PENTAACETIC ACID
31.2000 | Freq: Once | INTRAVENOUS | Status: AC | PRN
  Administered 2017-11-25: 31.2 via RESPIRATORY_TRACT

## 2017-11-25 MED ORDER — TECHNETIUM TO 99M ALBUMIN AGGREGATED
4.3000 | Freq: Once | INTRAVENOUS | Status: AC | PRN
  Administered 2017-11-25: 4.3 via INTRAVENOUS

## 2017-11-25 NOTE — Progress Notes (Signed)
*  Preliminary Results* Bilateral lower extremity venous duplex completed. Bilateral lower extremities are negative for deep vein thrombosis. There is no evidence of Baker's cyst bilaterally.  11/25/2017 1:43 PM Kurt Blair Part

## 2017-11-25 NOTE — Progress Notes (Signed)
Spoke with pt and notified of results per Dr. [name]. Pt verbalized understanding and denied any questions. 

## 2017-11-25 NOTE — Telephone Encounter (Signed)
Jennifer from the vascular lab called to advised that patients doppler was negative.  Will route to MW as Juluis Rainier.

## 2017-12-22 ENCOUNTER — Ambulatory Visit (INDEPENDENT_AMBULATORY_CARE_PROVIDER_SITE_OTHER): Payer: Medicare Other | Admitting: Internal Medicine

## 2017-12-22 ENCOUNTER — Ambulatory Visit: Payer: Medicare Other | Admitting: Internal Medicine

## 2017-12-22 ENCOUNTER — Encounter: Payer: Self-pay | Admitting: Internal Medicine

## 2017-12-22 VITALS — BP 126/68 | HR 66 | Ht 69.0 in | Wt 252.0 lb

## 2017-12-22 DIAGNOSIS — R0609 Other forms of dyspnea: Secondary | ICD-10-CM

## 2017-12-22 DIAGNOSIS — J449 Chronic obstructive pulmonary disease, unspecified: Secondary | ICD-10-CM | POA: Diagnosis not present

## 2017-12-22 LAB — PULMONARY FUNCTION TEST
DL/VA % PRED: 103 %
DL/VA: 4.68 ml/min/mmHg/L
DLCO COR % PRED: 66 %
DLCO UNC % PRED: 67 %
DLCO UNC: 20.86 ml/min/mmHg
DLCO cor: 20.74 ml/min/mmHg
FEF 25-75 POST: 2.12 L/s
FEF 25-75 PRE: 0.94 L/s
FEF2575-%CHANGE-POST: 124 %
FEF2575-%PRED-POST: 91 %
FEF2575-%Pred-Pre: 40 %
FEV1-%CHANGE-POST: 22 %
FEV1-%PRED-POST: 75 %
FEV1-%Pred-Pre: 61 %
FEV1-PRE: 1.7 L
FEV1-Post: 2.08 L
FEV1FVC-%CHANGE-POST: 8 %
FEV1FVC-%PRED-PRE: 90 %
FEV6-%Change-Post: 14 %
FEV6-%Pred-Post: 79 %
FEV6-%Pred-Pre: 69 %
FEV6-PRE: 2.42 L
FEV6-Post: 2.77 L
FEV6FVC-%Change-Post: 1 %
FEV6FVC-%PRED-PRE: 103 %
FEV6FVC-%Pred-Post: 104 %
FVC-%Change-Post: 13 %
FVC-%Pred-Post: 76 %
FVC-%Pred-Pre: 67 %
FVC-Post: 2.79 L
FVC-Pre: 2.46 L
POST FEV1/FVC RATIO: 74 %
PRE FEV6/FVC RATIO: 98 %
Post FEV6/FVC ratio: 100 %
Pre FEV1/FVC ratio: 69 %
RV % PRED: 97 %
RV: 2.36 L
TLC % pred: 72 %
TLC: 4.95 L

## 2017-12-22 MED ORDER — BUDESONIDE-FORMOTEROL FUMARATE 80-4.5 MCG/ACT IN AERO: 2.0000 | INHALATION_SPRAY | Freq: Two times a day (BID) | RESPIRATORY_TRACT | 0 refills | Status: DC

## 2017-12-22 MED ORDER — BUDESONIDE-FORMOTEROL FUMARATE 160-4.5 MCG/ACT IN AERO: 2.0000 | INHALATION_SPRAY | Freq: Two times a day (BID) | RESPIRATORY_TRACT | 11 refills | Status: DC

## 2017-12-22 NOTE — Patient Instructions (Addendum)
Symbicort  80  Take 2 puffs first thing in am  And ok to resume all your normal activities including exercise   Work on inhaler technique:  relax and gently blow all the way out then take a nice smooth deep breath back in, triggering the inhaler at same time you start breathing in.  Hold for up to 5 seconds if you can. Blow out thru nose. Rinse and gargle with water when done      Please schedule a follow up office visit in 6 weeks, call sooner if needed with all medications /inhalers/ solutions in hand so we can verify exactly what you are taking. This includes all medications from all doctors and over the counters  - need to do walking sats on return

## 2017-12-22 NOTE — Progress Notes (Signed)
PFT done today. 
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
## 2017-12-23 ENCOUNTER — Encounter: Payer: Self-pay | Admitting: Internal Medicine

## 2017-12-23 DIAGNOSIS — J449 Chronic obstructive pulmonary disease, unspecified: Secondary | ICD-10-CM | POA: Insufficient documentation

## 2017-12-23 NOTE — Assessment & Plan Note (Addendum)
PFT's  12/22/2017  FEV1 2.08  (75 % ) ratio 74  p 22 % improvement from saba p nothing prior to study with DLCO  67/66c % corrects to 103  % for alv volume  With mild curvature  - 12/22/2017  After extensive coaching inhaler device,  effectiveness =    75% so try symbicort 80 samples and rx for symb 160 2bid then return at 6 weeks for repeat walking study   Not clear this is the mechanism for his hypoxemia with ex (as his dlco is > 60%) so need to repeat the walking study on return.  Discussed in detail all the  indications, usual  risks and alternatives  relative to the benefits with patient who agrees to proceed with rx  as outlined     I had an extended discussion with the patient reviewing all relevant studies completed to date and  lasting 15 to 20 minutes of a 25 minute visit    See device teaching which extended face to face time for this visit.  Each maintenance medication was reviewed in detail including emphasizing most importantly the difference between maintenance and prns and under what circumstances the prns are to be triggered using an action plan format that is not reflected in the computer generated alphabetically organized AVS which I have not found useful in most complex patients, especially with respiratory illnesses  Please see AVS for specific instructions unique to this visit that I personally wrote and verbalized to the the pt in detail and then reviewed with pt  by my nurse highlighting any  changes in therapy recommended at today's visit to their plan of care.

## 2017-12-23 NOTE — Assessment & Plan Note (Signed)
Onset 09/2017 - Spirometry  09/22/17   FEV1 2.09 (73%)  Ratio 69 with early portion of f/v not physiologic  - CTa 09/25/17  No evidence of pulmonary embolism. No acute cardiopulmonary disease. Mild cardiomegaly with atherosclerotic coronary disease. - Echo 2d  08/20/88  G 1 diastolic dysfunction with Mild LAE  And  PAS 34   - 11/19/2017   Walked RA  2 laps @ 185 ft each stopped due to  Sob with sats 85% moderate pace  - Bilateral venous dopplers 11/25/2017 nl - V/Q  11/25/2017 low prob   He appears to have significant AB by pfts today so will rx this and have him return with walking sats in 6 weeks as nothing else has been apparent to date in the w/u to explain his symptoms.

## 2018-01-27 ENCOUNTER — Encounter: Payer: Self-pay | Admitting: Sports Medicine

## 2018-01-27 ENCOUNTER — Ambulatory Visit (INDEPENDENT_AMBULATORY_CARE_PROVIDER_SITE_OTHER): Payer: Medicare Other | Admitting: Sports Medicine

## 2018-01-27 DIAGNOSIS — M1711 Unilateral primary osteoarthritis, right knee: Secondary | ICD-10-CM | POA: Diagnosis not present

## 2018-01-27 DIAGNOSIS — M19011 Primary osteoarthritis, right shoulder: Secondary | ICD-10-CM | POA: Diagnosis not present

## 2018-01-27 MED ORDER — METHYLPREDNISOLONE ACETATE 40 MG/ML IJ SUSP
40.0000 mg | Freq: Once | INTRAMUSCULAR | Status: AC
  Administered 2018-01-27: 40 mg via INTRA_ARTICULAR

## 2018-01-27 NOTE — Progress Notes (Addendum)
    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 Knee started bothering him several weeks ago, giving out occasionally. No catching or locking. Minimal joint swelling. No catching or locking. Rarely gives out. Most painful with ambulation   Right Shoulder Pain:  - has known DH and AC joint DJD with last injection done 10/05/17 (SA bursa)  - painful to do over the head movements of his right arm. Pain is mainly in the lateral shoulder. Currently cannot lift arm above level of shoulder - no radiation of pain down the arm  - improved with injection completed in February   ROS: See HPI.  [place]:  PMH, Surgical history, social history, Allergies reviewed   PHYSICAL EXAM: BP (!) 150/90   Ht 5' 10"  (1.778 m)   Wt 260 lb (117.9 kg)   BMI 37.31 kg/m  Gen: NAD  Right Shoulder:  Inspection reveals no abnormalities, atrophy or asymmetry. Palpation: pain over AC joint. No pain over bicipital groove. ROM is limited: internal rotation and external rotation limited. Abduction and flexion is limited to about 110 degrees. Passive external rotation is normal.  Rotator cuff strength: weakness and pain with supraspinatus testing using empty can. Lift off test with discomfort. Also discomfort with resisted external rotation.   Signs of impingement with positive Neer and Hawkin's tests, empty can.  Right Knee: Normal to inspection with no erythema or effusion but does have obvious bony hypertrophy. Crepitus noted.  Palpation with no warmth or joint line tenderness. There is patellar tenderness ROM: painful with flexion, normal extension of the lower leg  Patellar and quadriceps tendons unremarkable.  ASSESSMENT/PLAN:  Right Knee OA:  Procedure:  Injection of RT knee with ultrasound guidance Consent obtained and verified. Time-out conducted. Noted no overlying erythema,  induration, or other signs of local infection. Skin prepped in a sterile fashion. Topical analgesic spray: Ethyl chloride. Completed without difficulty.  Seated position along medial joint line  Meds: 1 cc Solumedrol + 4 cc lidocaine 1% Pain immediately improved suggesting accurate placement of the medication. Advised to call if fevers/chills, erythema, induration, drainage, or persistent bleeding.  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 Time-out conducted. Noted no overlying erythema, induration, or other signs of local infection. Skin prepped in a sterile fashion. Topical analgesic spray: Ethyl chloride. Completed without difficulty.  Post. Approach. Meds: 1 cc Solumdrol 40 and 4 cc 1% lidocaine Pain immediately improved suggesting accurate placement of the medication. Advised to call if fevers/chills, erythema, induration, drainage, or persistent bleeding.  Follow up in 3 months for repeat injections as this keeps patient functional.    Sherilyn Banker, MD  [facility], PGY-3  I observed and examined the patient with the resident and agree with assessment and plan.  Note reviewed and modified by me. Stefanie Libel, MD

## 2018-02-04 ENCOUNTER — Encounter: Payer: Self-pay | Admitting: Internal Medicine

## 2018-02-04 ENCOUNTER — Ambulatory Visit (INDEPENDENT_AMBULATORY_CARE_PROVIDER_SITE_OTHER): Payer: Medicare Other | Admitting: Internal Medicine

## 2018-02-04 DIAGNOSIS — Z23 Encounter for immunization: Secondary | ICD-10-CM | POA: Diagnosis not present

## 2018-02-04 DIAGNOSIS — R0609 Other forms of dyspnea: Secondary | ICD-10-CM | POA: Diagnosis not present

## 2018-02-04 DIAGNOSIS — R0902 Hypoxemia: Secondary | ICD-10-CM | POA: Diagnosis not present

## 2018-02-04 DIAGNOSIS — J449 Chronic obstructive pulmonary disease, unspecified: Secondary | ICD-10-CM | POA: Diagnosis not present

## 2018-02-04 MED ORDER — BUDESONIDE-FORMOTEROL FUMARATE 160-4.5 MCG/ACT IN AERO: 2.0000 | INHALATION_SPRAY | Freq: Two times a day (BID) | RESPIRATORY_TRACT | 0 refills | Status: DC

## 2018-02-04 NOTE — Patient Instructions (Signed)
symbicort 160 Take 2 puffs first thing in am and then another 2 puffs about 12 hours later.    Work on inhaler technique:  relax and gently blow all the way out then take a nice smooth deep breath back in, triggering the inhaler at same time you start breathing in.  Hold for up to 5 seconds if you can. Blow out thru nose. Rinse and gargle with water when done     Stop symbicort when you run out of all your medication to see if better on vs off it   Please schedule a follow up office visit in 4 weeks, sooner if needed  with all medications /inhalers/ solutions in hand so we can verify exactly what you are taking. This includes all medications from all doctors and over the counters
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
## 2018-02-05 ENCOUNTER — Encounter: Payer: Self-pay | Admitting: Internal Medicine

## 2018-02-05 NOTE — Assessment & Plan Note (Signed)
Onset 09/2017 - Spirometry  09/22/17   FEV1 2.09 (73%)  Ratio 69 with early portion of f/v not physiologic  - CTa 09/25/17  No evidence of pulmonary embolism. No acute cardiopulmonary disease. Minimal linear atx bases  Mild cardiomegaly with atherosclerotic coronary disease. - Echo 2d  2/97/98  G 1 diastolic dysfunction with Mild LAE  And  PAS 34   - 11/19/2017   Walked RA  2 laps @ 185 ft each stopped due to  Sob with sats 85% moderate pace  - venous dopplers 11/25/2017 nl - V/Q  11/25/2017 low prob    Reviewed w/u to date including each study we've done sine May 2019 when dates onset of both the doe and the "dizziness" with the following conclusions  1) He does have an element of asthma that should respond consistently to either the 80 or 160 2bid symbicort rec as long as taking it consistently/ correctly and needs to continue to do paced exercise daily  On rx until runs out and then compare it to same intensity of ex until returns  2) declines ex 02 at this point   3) obesity/conditioning/ hypothyroidism and mild diastolic dysfunction may all be contributing to the non-variable doe he's experiencing and my not immediately improve on symb vs worse off and are the more likely chief culprits here  4) dizziness is sep issue not directly related to his breathing > referred back to pcp for this    Each maintenance medication was reviewed in detail including most importantly the difference between maintenance and as needed and under what circumstances the prns are to be used.  Please see AVS for specific  Instructions which are unique to this visit and I personally typed out  which were reviewed in detail in writing with the patient and a copy provided.    > 50% of 40 min ov today spent in counseling   See device teaching which extended face to face time for this visit

## 2018-02-05 NOTE — Assessment & Plan Note (Signed)
Body mass index is 39.68 kg/m.  -  trending up Lab Results  Component Value Date   TSH 5.16 (H) 11/19/2017     Contributing to gerd risk/ doe/reviewed the need and the process to achieve and maintain neg calorie balance > defer f/u primary care including intermittently monitoring thyroid status

## 2018-02-05 NOTE — Assessment & Plan Note (Signed)
desat to 85% 11/19/2017 moderate pace  - 02/04/2018  Walked RA x 3 laps @ 185 ft each stopped due to  End of study, sats 84% at fast pace   He declines ex 02 and is more focused today over his "dizzyness" which is worse with exertion but also present at rest and not proportional to ex or desats so assured him this was a secondary issue best handled by PCP   For now rec trial of symb 2bid then return here for repeat walking sats

## 2018-02-05 NOTE — Assessment & Plan Note (Signed)
PFT's  12/22/2017  FEV1 2.08  (75 % ) ratio 74  p 22 % improvement from saba p nothing prior to study with DLCO  67/66c % corrects to 103  % for alv volume  With mild curvature  - 12/22/2017  After extensive coaching inhaler device,  effectiveness =    75% so try symbicort 80 samples and rx for symb 160 2bid then return at 6 weeks for repeat walking study and return with all meds > did not return with meds as req  - 02/04/2018  After extensive coaching inhaler device,  effectiveness =    75% from a baseline of 25%   He does not meet criteria for copd but may have enough reversible airflow obst to benefit with exertion ; however, doe is likely multifactorial - see doe  Will try on symbicort until uses up all samples and ask him to continue paced ex until uses up his samples then try back off again to see what if any difference this makes but when returns must do so with all meds in hand using a trust but verify approach to confirm accurate Medication  Reconciliation The principal here is that until we are certain that the  patients are doing what we've asked, it makes no sense to ask them to do more.

## 2018-03-07 ENCOUNTER — Telehealth: Payer: Self-pay | Admitting: Internal Medicine

## 2018-03-07 MED ORDER — FLUTICASONE-SALMETEROL 115-21 MCG/ACT IN AERO: 2.0000 | INHALATION_SPRAY | Freq: Two times a day (BID) | RESPIRATORY_TRACT | 6 refills | Status: DC

## 2018-03-07 NOTE — Telephone Encounter (Signed)
"  symbicort 160 Take 2 puffs first thing in am and then another 2 puffs about 12 hours later.      Work on inhaler technique:  relax and gently blow all the way out then take a nice smooth deep breath back in, triggering the inhaler at same time you start breathing in.  Hold for up to 5 seconds if you can. Blow out thru nose. Rinse and gargle with water when done      Stop symbicort when you run out of all your medication to see if better on vs off it     Please schedule a follow up office visit in 4 weeks, sooner if needed  with all medications /inhalers/ solutions in hand so we can verify exactly what you are taking. This includes all medications from all doctors and over the counters"  Patient was last seen by MW on 02/04/18 and was given the above AVS. Patient is requesting to use Advair in place of the Symbicort. He stated that the Symbicort will cost him $120 a month vs Advair that will cost $45 a month. [facility] [address].   MW, please advise if you are ok with [place] switching him to Advair. Thanks!

## 2018-03-07 NOTE — Telephone Encounter (Signed)
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

## 2018-03-07 NOTE — Telephone Encounter (Signed)
Spoke with patient. He is aware of the medication change. Advair HFA has been called in. Nothing further needed at time of call.

## 2018-03-07 NOTE — Telephone Encounter (Signed)
If cannot reach patient on cell, call [phone].

## 2018-03-08 ENCOUNTER — Telehealth: Payer: Self-pay | Admitting: Internal Medicine

## 2018-03-08 MED ORDER — FLUTICASONE-SALMETEROL 250-50 MCG/DOSE IN AEPB: 1.0000 | INHALATION_SPRAY | Freq: Two times a day (BID) | RESPIRATORY_TRACT | 5 refills | Status: DC

## 2018-03-08 NOTE — Telephone Encounter (Signed)
Spoke with pt, advised I would call in Advair Diskus 250/50 to [facility] [place]. Pt understood and nothing further is needed.

## 2018-03-09 ENCOUNTER — Encounter: Payer: Self-pay | Admitting: Internal Medicine

## 2018-03-09 ENCOUNTER — Ambulatory Visit (INDEPENDENT_AMBULATORY_CARE_PROVIDER_SITE_OTHER): Payer: Medicare Other | Admitting: Internal Medicine

## 2018-03-09 VITALS — BP 132/70 | HR 71 | Ht 68.0 in | Wt 265.0 lb

## 2018-03-09 DIAGNOSIS — I10 Essential (primary) hypertension: Secondary | ICD-10-CM

## 2018-03-09 DIAGNOSIS — J449 Chronic obstructive pulmonary disease, unspecified: Secondary | ICD-10-CM | POA: Diagnosis not present

## 2018-03-09 DIAGNOSIS — R0609 Other forms of dyspnea: Secondary | ICD-10-CM | POA: Diagnosis not present

## 2018-03-09 MED ORDER — BISOPROLOL FUMARATE 5 MG PO TABS: 5.0000 mg | ORAL_TABLET | Freq: Every day | ORAL | 11 refills | Status: DC

## 2018-03-09 NOTE — Patient Instructions (Signed)
Stop coreg and start bisoprolol 5 mg one daily   Please see patient coordinator before you leave today  to schedule HRCT chest    Please schedule a follow up office visit in 4 weeks, sooner if needed with all your medications
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
## 2018-03-10 ENCOUNTER — Encounter: Payer: Self-pay | Admitting: Internal Medicine

## 2018-03-10 NOTE — Assessment & Plan Note (Addendum)
PFT's  12/22/2017  FEV1 2.08  (75 % ) ratio 74  p 22 % improvement from saba p nothing prior to study with DLCO  67/66c % corrects to 103  % for alv volume  With mild curvature  - 12/22/2017  After extensive coaching inhaler device,  effectiveness =    75% so try symbicort 80 samples and rx for symb 160 2bid then return at 6 weeks for repeat walking study and return with all meds > did not return with meds as req - 02/04/2018  After extensive coaching inhaler device,  effectiveness =    75% from a baseline of 25%   - 03/09/2018  After extensive coaching inhaler device,  effectiveness =    90% with wixela device  - 03/09/2018 try off coreg   He does report 50% improvement in breathing p started rx for AB component but the copd is relatively mild so he does appear to have difficult to sort out respiratory symptoms of unknown origin for which  DDX  = almost all start with A and  include Adherence, Ace Inhibitors, Acid Reflux, Active Sinus Disease, Alpha 1 Antitripsin deficiency, Anxiety masquerading as Airways dz,  ABPA,  Allergy(esp in young), Aspiration (esp in elderly), Adverse effects of meds,  Active smoking or Vaping, A bunch of PE's/clot burden (a few small clots can't cause this syndrome unless there is already severe underlying pulm or vascular dz with poor reserve),  Anemia or thyroid disorder, plus two Bs  = Bronchiectasis and Beta blocker use..and one C= CHF    Adherence is always the initial "prime suspect" and is a multilayered concern that requires a "trust but verify" approach in every patient - starting with knowing how to use medications, especially inhalers, correctly, keeping up with refills and understanding the fundamental difference between maintenance and prns vs those medications only taken for a very short course and then stopped and not refilled.  - see device teaching - return with all meds in hand using a trust but verify approach to confirm accurate Medication  Reconciliation  The principal here is that until we are certain that the  patients are doing what we've asked, it makes no sense to ask them to do more.   ? Adverse drug effects > see Beta blocker use  ? Allergy/asthma > continue mod dose ics/laba  ? Anxietydepression/ deconditioning/ wt gain  > usually at the bottom of this list of usual suspects and may interfere with adherence and also interpretation of response or lack thereof to symptom management which can be quite subjective.  ? A bunch of pe's > already ruled out by v/q   ? BB effects > see hbp  ?  chf > bnp much less than 100 rules out for all intents and purposes   >>> f/u 4 weeks no change copd meds for now

## 2018-03-10 NOTE — Assessment & Plan Note (Addendum)
Onset 09/2017 - Spirometry  09/22/17   FEV1 2.09 (73%)  Ratio 69 with early portion of f/v not physiologic  - CTa 09/25/17  No evidence of pulmonary embolism. No acute cardiopulmonary disease. Minimal linear atx bases  Mild cardiomegaly with atherosclerotic coronary disease. - Echo 2d  5/73/22  G 1 diastolic dysfunction with Mild LAE  And  PAS 34   - 11/19/2017   Walked RA  2 laps @ 185 ft each stopped due to  Sob with sats 85% moderate pace  - venous dopplers 11/25/2017 nl - V/Q  11/25/2017 low prob  - 03/09/2018   Walked RA  2 laps @ 185 ft each stopped due to  Sob/ desat to 87% at nl= moderate pace  - HRCT chest ordered    Not clear why he's desaturating with exertion but suspect he has basilar atx so need hrct in prone and supine position next.  He declined amb 02 today

## 2018-03-10 NOTE — Assessment & Plan Note (Addendum)
Changed coreg to bisoprolol 03/09/2018 due to partial response to laba/ics   In the setting of respiratory symptoms of unknown etiology,  It would be preferable to use bystolic, the most beta -1  selective Beta blocker available in sample form, with bisoprolol the most selective generic choice  on the market, at least on a trial basis, to make sure the spillover Beta 2 effects of the less specific Beta blockers are not contributing to this patient's symptoms.   Try bisoprolol 5 mg daily

## 2018-03-10 NOTE — Assessment & Plan Note (Addendum)
Body mass index is 40.29 kg/m.  -  trending up/ noted Lab Results  Component Value Date   TSH 5.16 (H) 11/19/2017     Contributing to gerd risk/ doe/reviewed the need and the process to achieve and maintain neg calorie balance > defer f/u primary care including intermittently monitoring thyroid status     I had an extended discussion with the patient reviewing all relevant studies completed to date and  lasting 25 minutes of a 40  minute office  visit  Addressing  non-specific but potentially very serious refractory respiratory symptoms of uncertain and potentially multiple  Etiologies.  See device teaching which extended face to face time for this visit    Each maintenance medication was reviewed in detail including most importantly the difference between maintenance and prns and under what circumstances the prns are to be triggered using an action plan format that is not reflected in the computer generated alphabetically organized AVS.    Please see AVS for specific instructions unique to this office visit that I personally wrote and verbalized to the the pt in detail and then reviewed with pt  by my nurse highlighting any changes in therapy/plan of care  recommended at today's visit.

## 2018-03-11 ENCOUNTER — Telehealth: Payer: Self-pay | Admitting: *Deleted

## 2018-03-11 NOTE — Telephone Encounter (Signed)
-----   Message from Tanda Rockers, MD sent at 03/10/2018  8:23 AM EDT ----- Need to be sure his hrct is done prone and supine both

## 2018-03-11 NOTE — Telephone Encounter (Signed)
Spoke with Lattie Haw at [facility] and she will add this to the notes so it gets done prone and supine

## 2018-03-24 ENCOUNTER — Ambulatory Visit (INDEPENDENT_AMBULATORY_CARE_PROVIDER_SITE_OTHER)
Admission: RE | Admit: 2018-03-24 | Discharge: 2018-03-24 | Disposition: A | Discharge status: Home / Self Care | Payer: Medicare Other | Source: Ambulatory Visit | Attending: Internal Medicine | Admitting: Internal Medicine

## 2018-03-24 DIAGNOSIS — R0609 Other forms of dyspnea: Secondary | ICD-10-CM | POA: Diagnosis not present

## 2018-03-24 DIAGNOSIS — J984 Other disorders of lung: Secondary | ICD-10-CM | POA: Diagnosis not present

## 2018-03-25 ENCOUNTER — Ambulatory Visit: Payer: Medicare Other | Admitting: Internal Medicine

## 2018-03-28 ENCOUNTER — Telehealth: Payer: Self-pay | Admitting: Internal Medicine

## 2018-03-28 NOTE — Progress Notes (Signed)
LMTCB

## 2018-03-28 NOTE — Telephone Encounter (Signed)
Called and spoke to patient, patient made aware of CT results. Voiced understanding. Nothing further is needed at this time.

## 2018-03-29 NOTE — Progress Notes (Signed)
See PN dated 03/28/18

## 2018-04-11 ENCOUNTER — Encounter: Payer: Self-pay | Admitting: Internal Medicine

## 2018-04-11 ENCOUNTER — Ambulatory Visit (INDEPENDENT_AMBULATORY_CARE_PROVIDER_SITE_OTHER): Payer: Medicare Other | Admitting: Internal Medicine

## 2018-04-11 ENCOUNTER — Other Ambulatory Visit: Payer: Self-pay | Admitting: Endocrinology

## 2018-04-11 VITALS — BP 144/88 | HR 74 | Ht 68.0 in | Wt 273.0 lb

## 2018-04-11 DIAGNOSIS — J449 Chronic obstructive pulmonary disease, unspecified: Secondary | ICD-10-CM | POA: Diagnosis not present

## 2018-04-11 DIAGNOSIS — I10 Essential (primary) hypertension: Secondary | ICD-10-CM

## 2018-04-11 DIAGNOSIS — R0609 Other forms of dyspnea: Secondary | ICD-10-CM | POA: Diagnosis not present

## 2018-04-11 NOTE — Patient Instructions (Signed)
Go ahead and take zebeta twice daily until you use if up,  then resume coreg to see if there's any difference in your breathing   Please schedule a follow up visit in 6 months but call sooner if needed
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
## 2018-04-12 ENCOUNTER — Encounter: Payer: Self-pay | Admitting: Internal Medicine

## 2018-04-12 NOTE — Assessment & Plan Note (Signed)
Onset 09/2017 - Spirometry  09/22/17   FEV1 2.09 (73%)  Ratio 69 with early portion of f/v not physiologic  - CTa 09/25/17  No evidence of pulmonary embolism. No acute cardiopulmonary disease. Minimal linear atx bases  Mild cardiomegaly with atherosclerotic coronary disease. - Echo 2d  5/70/22  G 1 diastolic dysfunction with Mild LAE  And  PAS 34   - 11/19/2017   Walked RA  2 laps @ 185 ft each stopped due to  Sob with sats 85% moderate pace  - venous dopplers 11/25/2017 nl - V/Q  11/25/2017 low prob - 03/09/2018   Walked RA  2 laps @ 185 ft each stopped due to  Sob/ desat to 87% at nl= moderate pace  - HRCT chest 03/25/2018 >>>  1. No findings to suggest interstitial lung disease. 2. 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 3. Mild air trapping indicative of very mild small airways disease. 4. Aortic atherosclerosis, in addition to left main and 3 vessel coronary artery disease.    No evidence ILD/ PE so focus on airway issues and f/u q 6 m, sooner if needed

## 2018-04-12 NOTE — Assessment & Plan Note (Addendum)
Body mass index is 41.51 kg/m.  -  trending up  Lab Results  Component Value Date   TSH 5.16 (H) 11/19/2017     Contributing to gerd risk/ doe/reviewed the need and the process to achieve and maintain neg calorie balance > defer f/u primary care including intermittently monitoring thyroid status

## 2018-04-12 NOTE — Assessment & Plan Note (Signed)
PFT's  12/22/2017  FEV1 2.08  (75 % ) ratio 74  p 22 % improvement from saba p nothing prior to study with DLCO  67/66c % corrects to 103  % for alv volume  With mild curvature  - 12/22/2017  After extensive coaching inhaler device,  effectiveness =    75% so try symbicort 80 samples and rx for symb 160 2bid then return at 6 weeks for repeat walking study and return with all meds > did not return with meds as req - 02/04/2018  After extensive coaching inhaler device,  effectiveness =    75% from a baseline of 25% - 03/09/2018  After extensive coaching inhaler device,  effectiveness =    90% with wixela device  - 03/09/2018 try off coreg > not able to do due to dosing issues with bisoprolol so rec repeat trial 04/11/2018    He has minimal ab, not copd, so should benefit from maint advair/wixela unless starts to bother his upper airway and ideally In the setting of respiratory symptoms of uncertain etiology,  It would be preferable to use bystolic, the most beta -1  selective Beta blocker available in sample form, with bisoprolol the most selective generic choice  on the market, at least on a trial basis, to make sure the spillover Beta 2 effects of the less specific Beta blockers are not contributing to this patient's symptoms.   >>>> So rec rechallenge with bisoprolol (see separate a/p), no change in resp rx

## 2018-04-12 NOTE — Assessment & Plan Note (Addendum)
Changed coreg to bisoprolol 03/09/2018 due to partial response to laba/ics  rechallenge with bisoprolol 5 mg but increase to bid and asked pt to call med if not adequate or f/u with PCP but ideally needs to be off coreg for at least a week or two to judge any changes related to using more specific BB    I had an extended discussion with the patient reviewing all relevant studies completed to date and  lasting 15 to 20 minutes of a 25 minute visit    Each maintenance medication was reviewed in detail including most importantly the difference between maintenance and prns and under what circumstances the prns are to be triggered using an action plan format that is not reflected in the computer generated alphabetically organized AVS.     Please see AVS for specific instructions unique to this visit that I personally wrote and verbalized to the the pt in detail and then reviewed with pt  by my nurse highlighting any  changes in therapy recommended at today's visit to their plan of care.

## 2018-04-14 ENCOUNTER — Other Ambulatory Visit: Payer: Self-pay | Admitting: Internal Medicine

## 2018-04-20 ENCOUNTER — Other Ambulatory Visit: Payer: Self-pay | Admitting: Internal Medicine

## 2018-04-20 DIAGNOSIS — I5030 Unspecified diastolic (congestive) heart failure: Secondary | ICD-10-CM

## 2018-04-20 DIAGNOSIS — I10 Essential (primary) hypertension: Secondary | ICD-10-CM

## 2018-04-26 ENCOUNTER — Other Ambulatory Visit: Payer: Self-pay | Admitting: Endocrinology

## 2018-05-12 ENCOUNTER — Encounter: Payer: Self-pay | Admitting: Sports Medicine

## 2018-05-12 ENCOUNTER — Ambulatory Visit (INDEPENDENT_AMBULATORY_CARE_PROVIDER_SITE_OTHER): Payer: Medicare Other | Admitting: Sports Medicine

## 2018-05-12 VITALS — BP 152/87 | Ht 70.0 in | Wt 255.0 lb

## 2018-05-12 DIAGNOSIS — M19011 Primary osteoarthritis, right shoulder: Secondary | ICD-10-CM | POA: Diagnosis not present

## 2018-05-12 DIAGNOSIS — M1711 Unilateral primary osteoarthritis, right knee: Secondary | ICD-10-CM | POA: Diagnosis not present

## 2018-05-12 MED ORDER — METHYLPREDNISOLONE ACETATE 40 MG/ML IJ SUSP
40.0000 mg | Freq: Once | INTRAMUSCULAR | Status: AC
  Administered 2018-05-12: 40 mg via INTRA_ARTICULAR

## 2018-05-12 NOTE — Progress Notes (Signed)
  QUINTO TIPPY - 73 y.o. 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  He was last seen about 4 months ago and had a steroid injection in the right subacromial space in the right knee.  He gets these regularly and typically several months of relief.  He had a little over 2 months relief in the knee and over 3 months of relief in the right shoulder.  Over the past month both pains have worsened.  Regarding his shoulder, he notes increased issues with range of motion being able to reach overhead without significant pain.  This is typical pain for him.  He also notes increased knee pain but denies any swelling, erythema.  No distal numbness or tingling in the upper or lower extremities  ROS:     See HPI  PERTINENT  PMH / PSH FH / / SH:  Past Medical, Surgical, Social, and Family History Reviewed & Updated in the EMR.     OBJECTIVE: BP (!) 152/87   Ht 5' 10"  (1.778 m)   Wt 255 lb (115.7 kg)   BMI 36.59 kg/m   Physical Exam:  Vital signs are reviewed.  GEN: Alert and oriented, NAD Pulm: Breathing unlabored PSY: normal mood, congruent affect  MSK: Right shoulder: No obvious deformity or asymmetry. No bruising. No swelling Diffuse anterior lateral tenderness of the shoulder Patient is limited range of motion due to pain and has difficulty abducting or flexing above 90 degrees NV intact distally Special Tests:  - Impingement: Positive Hawkins positive Neers.    Right knee: - Inspection: Hypertrophic arthritic changes.  Trace effusion.  No erythema - Palpation: Tenderness over the medial joint line - ROM: full active ROM with flexion and extension in knee.  Mild crepitus noted - Strength: 5/5 strength - Neuro/vasc: NV intact - Special Tests:      ASSESSMENT & PLAN:  1.  Right knee pain secondary to osteoarthritis-  steroid injection performed today  Procedure performed: knee intraarticular corticosteroid injection; ultrasound assisted  Consent obtained and verified. Time-out conducted. Noted no overlying erythema, induration, or other signs of local infection. The  right medial joint line was there 5 with ultrasound and marked.  The overlying skin was prepped in a sterile fashion. Topical analgesic spray: Ethyl chloride. Joint: Right knee Needle: 25-gauge, 1.5 inch Completed without difficulty. Meds: Depo-Medrol 40 mg, lidocaine 4 cc   2.  Right shoulder pain-patient has known GH arthritis as well as impingement.  He has done well with subacromial steroid injections in the past.  This was repeated today  Procedure performed: subacromial corticosteroid injection; palpation guided  Consent obtained and verified. Time-out conducted. Noted no overlying erythema, induration, or other signs of local infection. The  right posterior subacromial space was palpated and marked. The overlying skin was prepped in a sterile fashion. Topical analgesic spray: Ethyl chloride. Joint: Right subacromial Needle: 25-gauge, 1.5 inch Completed without difficulty. Meds: Depo-Medrol 40 mg, lidocaine 4 cc  I observed and examined the patient with the SM Fellow and agree with assessment and plan.  Note reviewed and modified by me. Ila Mcgill, MD

## 2018-05-18 ENCOUNTER — Other Ambulatory Visit: Payer: Self-pay | Admitting: Endocrinology

## 2018-05-18 NOTE — Telephone Encounter (Signed)
Please refer request to new PCP

## 2018-06-09 NOTE — Progress Notes (Signed)
Subjective:    Patient ID: Kurt Blair, male    DOB: 07-Jun-1945, 73 y.o.   MRN: [id number]  HPI He is here for an acute visit for cold symptoms.  His symptoms started  5 days ago  He is experiencing nasal congestion, sore throat, cough productive of minimal sputum at times and some SOB.  He denies other symptoms.   He has tried taking throat lozenges  Medications and allergies reviewed with patient and updated if appropriate.  Patient Active Problem List   Diagnosis Date Noted  . Morbid obesity due to excess calories complicated by hbp/ hyperlipidemia  02/05/2018  . COPD GOLD 0  12/23/2017  . Exercise hypoxemia 11/20/2017  . CHF with left ventricular diastolic dysfunction, NYHA class 1 ([place]) 11/09/2017  . 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 Osteoarthritis of right knee 06/04/2016  . Essential hypertension 04/14/2015  . Hypothyroidism 11/13/2014  . Routine general medical examination at a health care facility 06/03/2012  . Nonspecific (abnormal) findings on radiological and other examination of body structure 07/25/2009  . ALCOHOL USE 06/07/2008  . NASH (nonalcoholic steatohepatitis) 06/07/2008  . SUBACROMIAL BURSITIS, RIGHT 06/07/2008  . DOE (dyspnea on exertion) 06/07/2008  . ERECTILE DYSFUNCTION 02/16/2007  . SMOKER 02/16/2007  . VENOUS INSUFFICIENCY 02/16/2007  . Hyperglycemia 02/16/2007    Current Outpatient Medications on File Prior to Visit  Medication Sig Dispense Refill  . atorvastatin (LIPITOR) 80 MG tablet TAKE 1 TABLET (80 MG TOTAL) BY MOUTH DAILY. 30 tablet 0  . atorvastatin (LIPITOR) 80 MG tablet TAKE 1 TABLET (80 MG TOTAL) BY MOUTH DAILY. 90 tablet 1  . bisoprolol (ZEBETA) 5 MG tablet Take 1 tablet (5 mg total) by mouth daily. 30 tablet 11  . carvedilol (COREG) 12.5 MG tablet TAKE 1 TABLET (12.5 MG TOTAL) BY MOUTH 2 (TWO) TIMES DAILY WITH A MEAL. 180 tablet 2  . Fluticasone-Salmeterol (WIXELA INHUB) 250-50 MCG/DOSE AEPB Inhale 1  puff into the lungs 2 (two) times daily.    . furosemide (LASIX) 40 MG tablet TAKE 1 TABLET BY MOUTH EVERY DAY 90 tablet 2  . KLOR-CON M20 20 MEQ tablet TAKE 1 TABLET BY MOUTH TWICE A DAY 180 tablet 2  . olmesartan (BENICAR) 20 MG tablet TAKE 1 TABLET BY MOUTH EVERY DAY 90 tablet 1   No current facility-administered medications on file prior to visit.     Past Medical History:  Diagnosis Date  . ANXIETY 02/16/2007  . DEPRESSION 02/16/2007  . Diverticulosis   . ERECTILE DYSFUNCTION 02/16/2007  . FATTY LIVER DISEASE 06/07/2008  . HYPERGLYCEMIA 02/16/2007  . Hyperlipidemia   . Internal hemorrhoid   . NASH (nonalcoholic steatohepatitis)   . PULMONARY EMBOLISM, HX OF 02/16/2007  . SUBACROMIAL BURSITIS, RIGHT 06/07/2008  . Tubular adenoma of colon 07/2001  . VENOUS INSUFFICIENCY 02/16/2007    Past Surgical History:  Procedure Laterality Date  . COLONOSCOPY    . Drainage fluid from chest  1990  . Leg stripping  2003   Left     Social History   Socioeconomic History  . Marital status: Married    Spouse name: Not on file  . Number of children: Not on file  . Years of education: Not on file  . Highest education level: Not on file  Occupational History  . 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 Financial resource strain: Not on file  . Food insecurity:    Worry: Not on file  Inability: Not on file  . Transportation needs:    Medical: Not on file    Non-medical: Not on file  Tobacco Use  . Smoking status: Former Smoker    Packs/day: 0.50    Years: 50.00    Pack years: 25.00    Last attempt to quit: 05/11/2013    Years since quitting: 5.0  . Smokeless tobacco: Never Used  Substance and Sexual Activity  . Alcohol use: Yes    Alcohol/week: 14.0 standard drinks    Types: 14 Shots of liquor per week    Comment: every night 6oz brandy  . Drug use: No  . Sexual activity: Not on file  Lifestyle  . Physical activity:    Days per week: Not on file     Minutes per session: Not on file  . Stress: Not on file  Relationships  . Social connections:    Talks on phone: Not on file    Gets together: Not on file    Attends religious service: Not on file    Active member of club or organization: Not on file    Attends meetings of clubs or organizations: Not on file    Relationship status: Not on file  Other Topics Concern  . Not on file  Social History Narrative   [name] (who pt and wife raised) died of cancer 07-31-2009    Family History  Problem Relation Age of Onset  . Cancer Mother        uncertain type  . Cancer Sister        Lung Disease  . Colon cancer Neg Hx   . Esophageal cancer Neg Hx   . Rectal cancer Neg Hx   . Stomach cancer Neg Hx   . Prostate cancer Neg Hx   . Pancreatic cancer Neg Hx     Review of Systems  Constitutional: Negative for chills and fever.  HENT: Positive for congestion and sore throat. Negative for ear pain and sinus pain.   Respiratory: Positive for cough (occ minimal sputum ) and shortness of breath. Negative for wheezing.   Cardiovascular: Negative for chest pain.  Gastrointestinal: Negative for diarrhea and nausea.  Musculoskeletal: Negative for myalgias.  Neurological: Negative for dizziness, light-headedness and headaches.       Objective:   Vitals:   06/10/18 1422  BP: (!) 162/78  Pulse: (!) 59  Resp: 18  Temp: 98.2 F (36.8 C)  SpO2: 93%   Filed Weights   06/10/18 1422  Weight: 261 lb 12.8 oz (118.8 kg)   Body mass index is 37.56 kg/m.  Wt Readings from Last 3 Encounters:  06/10/18 261 lb 12.8 oz (118.8 kg)  05/12/18 255 lb (115.7 kg)  04/11/18 273 lb (123.8 kg)     Physical Exam GENERAL APPEARANCE: Appears stated age, well appearing, NAD EYES: conjunctiva clear, no icterus HEENT: bilateral tympanic membranes and ear canals normal, oropharynx with mild erythema, no thyromegaly, trachea midline, no cervical or supraclavicular lymphadenopathy LUNGS: unlabored breathing,  good air entry bilaterally, mild wheeze left base, no crackles CARDIOVASCULAR: Normal S1,S2 without murmurs, no edema SKIN: warm, dry      Assessment & Plan:   See Problem List for Assessment and Plan of chronic medical problems.

## 2018-06-10 ENCOUNTER — Encounter: Payer: Self-pay | Admitting: Internal Medicine

## 2018-06-10 ENCOUNTER — Ambulatory Visit (INDEPENDENT_AMBULATORY_CARE_PROVIDER_SITE_OTHER)
Admission: RE | Admit: 2018-06-10 | Discharge: 2018-06-10 | Disposition: A | Discharge status: Home / Self Care | Payer: Medicare Other | Source: Ambulatory Visit | Attending: Internal Medicine | Admitting: Internal Medicine

## 2018-06-10 ENCOUNTER — Ambulatory Visit (INDEPENDENT_AMBULATORY_CARE_PROVIDER_SITE_OTHER): Payer: Medicare Other | Admitting: Internal Medicine

## 2018-06-10 VITALS — BP 162/78 | HR 59 | Temp 98.2°F | Resp 18 | Ht 70.0 in | Wt 261.8 lb

## 2018-06-10 DIAGNOSIS — R05 Cough: Secondary | ICD-10-CM | POA: Diagnosis not present

## 2018-06-10 DIAGNOSIS — J069 Acute upper respiratory infection, unspecified: Secondary | ICD-10-CM | POA: Diagnosis not present

## 2018-06-10 DIAGNOSIS — R0602 Shortness of breath: Secondary | ICD-10-CM | POA: Diagnosis not present

## 2018-06-10 NOTE — Assessment & Plan Note (Signed)
Cold symptoms likely viral in nature Oxygenation is stable for him and no fever We will get a chest x-ray given mild wheeze at left base and will only start antibiotic if it there is evidence of infection on his chest x-ray Symptomatic treatment with over-the-counter cold medications Call or return if no improvement

## 2018-06-10 NOTE — Patient Instructions (Signed)
Have a chest xray today.  We will call you with the result.     Your symptoms are likely viral in nature so an antibiotic may not be necessary.    Take coricidin cold products for your symptoms.   You can take mucinex for your nasal congestion and use a saline nasal spray.  If your x-ray shows an infection we will start an antibiotic.

## 2018-06-22 ENCOUNTER — Ambulatory Visit (INDEPENDENT_AMBULATORY_CARE_PROVIDER_SITE_OTHER): Payer: Medicare Other | Admitting: Family

## 2018-06-22 ENCOUNTER — Encounter: Payer: Self-pay | Admitting: Family

## 2018-06-22 VITALS — BP 160/98 | HR 69 | Temp 98.3°F | Ht 70.0 in | Wt 260.0 lb

## 2018-06-22 DIAGNOSIS — R05 Cough: Secondary | ICD-10-CM

## 2018-06-22 DIAGNOSIS — I10 Essential (primary) hypertension: Secondary | ICD-10-CM

## 2018-06-22 DIAGNOSIS — J209 Acute bronchitis, unspecified: Secondary | ICD-10-CM

## 2018-06-22 DIAGNOSIS — J441 Chronic obstructive pulmonary disease with (acute) exacerbation: Secondary | ICD-10-CM

## 2018-06-22 DIAGNOSIS — R059 Cough, unspecified: Secondary | ICD-10-CM

## 2018-06-22 MED ORDER — FLUTICASONE PROPIONATE 50 MCG/ACT NA SUSP: 2.0000 | Freq: Every day | NASAL | 6 refills | Status: DC

## 2018-06-22 MED ORDER — DOXYCYCLINE HYCLATE 100 MG PO TABS: 100.0000 mg | ORAL_TABLET | Freq: Two times a day (BID) | ORAL | 0 refills | Status: DC

## 2018-06-22 MED ORDER — BENZONATATE 100 MG PO CAPS: 100.0000 mg | ORAL_CAPSULE | Freq: Three times a day (TID) | ORAL | 0 refills | Status: DC | PRN

## 2018-06-22 MED ORDER — METHYLPREDNISOLONE ACETATE 40 MG/ML IJ SUSP
40.0000 mg | Freq: Once | INTRAMUSCULAR | Status: AC
  Administered 2018-06-22: 40 mg via INTRAMUSCULAR

## 2018-06-22 NOTE — Progress Notes (Signed)
Kurt Blair is a 73 y.o. male with the following history as recorded in [employer]:  Patient Active Problem List   Diagnosis Date Noted  . Upper respiratory tract infection 06/10/2018  . Morbid obesity due to excess calories complicated by hbp/ hyperlipidemia  02/05/2018  . COPD GOLD 0  12/23/2017  . Exercise hypoxemia 11/20/2017  . CHF with left ventricular diastolic dysfunction, NYHA class 1 ([place]) 11/09/2017  . 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 Osteoarthritis of right knee 06/04/2016  . Essential hypertension 04/14/2015  . Hypothyroidism 11/13/2014  . Routine general medical examination at a health care facility 06/03/2012  . Nonspecific (abnormal) findings on radiological and other examination of body structure 07/25/2009  . ALCOHOL USE 06/07/2008  . NASH (nonalcoholic steatohepatitis) 06/07/2008  . SUBACROMIAL BURSITIS, RIGHT 06/07/2008  . DOE (dyspnea on exertion) 06/07/2008  . ERECTILE DYSFUNCTION 02/16/2007  . SMOKER 02/16/2007  . VENOUS INSUFFICIENCY 02/16/2007  . Hyperglycemia 02/16/2007    Current Outpatient Medications  Medication Sig Dispense Refill  . atorvastatin (LIPITOR) 80 MG tablet TAKE 1 TABLET (80 MG TOTAL) BY MOUTH DAILY. 90 tablet 1  . carvedilol (COREG) 12.5 MG tablet TAKE 1 TABLET (12.5 MG TOTAL) BY MOUTH 2 (TWO) TIMES DAILY WITH A MEAL. 180 tablet 2  . Fluticasone-Salmeterol (WIXELA INHUB) 250-50 MCG/DOSE AEPB Inhale 1 puff into the lungs 2 (two) times daily.    . furosemide (LASIX) 40 MG tablet TAKE 1 TABLET BY MOUTH EVERY DAY 90 tablet 2  . KLOR-CON M20 20 MEQ tablet TAKE 1 TABLET BY MOUTH TWICE A DAY 180 tablet 2  . olmesartan (BENICAR) 20 MG tablet TAKE 1 TABLET BY MOUTH EVERY DAY 90 tablet 1  . benzonatate (TESSALON) 100 MG capsule Take 1 capsule (100 mg total) by mouth 3 (three) times daily as needed. 20 capsule 0  . doxycycline (VIBRA-TABS) 100 MG tablet Take 1 tablet (100 mg total) by mouth 2 (two) times daily. 20 tablet 0  .  fluticasone (FLONASE) 50 MCG/ACT nasal spray Place 2 sprays into both nostrils daily. 16 g 6   No current facility-administered medications for this visit.     Allergies: Latex  Past Medical History:  Diagnosis Date  . ANXIETY 02/16/2007  . DEPRESSION 02/16/2007  . Diverticulosis   . ERECTILE DYSFUNCTION 02/16/2007  . FATTY LIVER DISEASE 06/07/2008  . HYPERGLYCEMIA 02/16/2007  . Hyperlipidemia   . Internal hemorrhoid   . NASH (nonalcoholic steatohepatitis)   . PULMONARY EMBOLISM, HX OF 02/16/2007  . SUBACROMIAL BURSITIS, RIGHT 06/07/2008  . Tubular adenoma of colon 07/2001  . VENOUS INSUFFICIENCY 02/16/2007    Past Surgical History:  Procedure Laterality Date  . COLONOSCOPY    . Drainage fluid from chest  1990  . Leg stripping  2003   Left     Family History  Problem Relation Age of Onset  . Cancer Mother        uncertain type  . Cancer Sister        Lung Disease  . Colon cancer Neg Hx   . Esophageal cancer Neg Hx   . Rectal cancer Neg Hx   . Stomach cancer Neg Hx   . Prostate cancer Neg Hx   . Pancreatic cancer Neg Hx     Social History   Tobacco Use  . Smoking status: Former Smoker    Packs/day: 0.50    Years: 50.00    Pack years: 25.00    Last attempt to quit: 05/11/2013    Years  since quitting: 5.1  . Smokeless tobacco: Never Used  Substance Use Topics  . Alcohol use: Yes    Alcohol/week: 14.0 standard drinks    Types: 14 Shots of liquor per week    Comment: every night 6oz brandy    Subjective:  2 week history of cough/ congestion; known history of COPD- has used his inhaler occasionally; no fever but having chills; + wheezing; had normal CXR 2 weeks ago; no relief with OTC medications; chest feels tight;   Does not check his blood pressure regularly; Denies any chest pain, shortness of breath, blurred vision or headache. Takes Olmesartan 20 mg and Coreg bid;     Objective:  Vitals:   06/22/18 1356  BP: (!) 160/98  Pulse: 69  Temp: 98.3 F (36.8 C)   TempSrc: Oral  SpO2: 92%  Weight: 260 lb (117.9 kg)  Height: 5' 10"  (1.778 m)    General: Well developed, well nourished, in no acute distress  Skin : Warm and dry.  Head: Normocephalic and atraumatic  Eyes: Sclera and conjunctiva clear; pupils round and reactive to light; extraocular movements intact  Ears: External normal; canals clear; tympanic membranes normal  Oropharynx: Pink, supple. No suspicious lesions  Neck: Supple without thyromegaly, adenopathy  Lungs: Respirations unlabored; coarse breath sounds noted  CVS exam: normal rate and regular rhythm.  Neurologic: Alert and oriented; speech intact; face symmetrical; moves all extremities well; CNII-XII intact without focal deficit   Assessment:  1. Cough   2. Acute bronchitis, unspecified organism   3. COPD with acute exacerbation ([place])   4. Essential hypertension     Plan:  1. CXR was done 2 weeks ago- normal; Rx for Doxycycline 100 mg bid x 10 days, Flonase and Tessalon Perles; Depo-Medrol IM 40 mg daily; 4. Uncontrolled; increase Olmesartan to 20 mg bid; return for nurse visit in 1 week;    Return in about 1 week (around 06/29/2018) for nurse visit/ blood pressure check.  No orders of the defined types were placed in this encounter.   Requested Prescriptions   Signed Prescriptions Disp Refills  . doxycycline (VIBRA-TABS) 100 MG tablet 20 tablet 0    Sig: Take 1 tablet (100 mg total) by mouth 2 (two) times daily.  . fluticasone (FLONASE) 50 MCG/ACT nasal spray 16 g 6    Sig: Place 2 sprays into both nostrils daily.  . benzonatate (TESSALON) 100 MG capsule 20 capsule 0    Sig: Take 1 capsule (100 mg total) by mouth 3 (three) times daily as needed.

## 2018-06-29 ENCOUNTER — Ambulatory Visit (INDEPENDENT_AMBULATORY_CARE_PROVIDER_SITE_OTHER): Payer: Medicare Other | Admitting: Internal Medicine

## 2018-06-29 ENCOUNTER — Other Ambulatory Visit (INDEPENDENT_AMBULATORY_CARE_PROVIDER_SITE_OTHER): Payer: Medicare Other

## 2018-06-29 ENCOUNTER — Encounter: Payer: Self-pay | Admitting: Internal Medicine

## 2018-06-29 VITALS — BP 170/100 | HR 60 | Temp 97.7°F | Ht 70.0 in | Wt 258.5 lb

## 2018-06-29 DIAGNOSIS — I5030 Unspecified diastolic (congestive) heart failure: Secondary | ICD-10-CM

## 2018-06-29 DIAGNOSIS — E039 Hypothyroidism, unspecified: Secondary | ICD-10-CM | POA: Diagnosis not present

## 2018-06-29 DIAGNOSIS — I10 Essential (primary) hypertension: Secondary | ICD-10-CM

## 2018-06-29 DIAGNOSIS — E559 Vitamin D deficiency, unspecified: Secondary | ICD-10-CM

## 2018-06-29 LAB — CBC WITH DIFFERENTIAL/PLATELET
Basophils Absolute: 0 10*3/uL (ref 0.0–0.1)
Basophils Relative: 0.5 % (ref 0.0–3.0)
Eosinophils Absolute: 0.2 10*3/uL (ref 0.0–0.7)
Eosinophils Relative: 3.5 % (ref 0.0–5.0)
HCT: 49.2 % (ref 39.0–52.0)
Hemoglobin: 16.6 g/dL (ref 13.0–17.0)
LYMPHS ABS: 1.7 10*3/uL (ref 0.7–4.0)
Lymphocytes Relative: 28.6 % (ref 12.0–46.0)
MCHC: 33.8 g/dL (ref 30.0–36.0)
MCV: 90.8 fl (ref 78.0–100.0)
Monocytes Absolute: 0.5 10*3/uL (ref 0.1–1.0)
Monocytes Relative: 9 % (ref 3.0–12.0)
NEUTROS ABS: 3.4 10*3/uL (ref 1.4–7.7)
Neutrophils Relative %: 58.4 % (ref 43.0–77.0)
Platelets: 213 10*3/uL (ref 150.0–400.0)
RBC: 5.41 Mil/uL (ref 4.22–5.81)
RDW: 15.4 % (ref 11.5–15.5)
WBC: 5.8 10*3/uL (ref 4.0–10.5)

## 2018-06-29 LAB — VITAMIN D 25 HYDROXY (VIT D DEFICIENCY, FRACTURES): VITD: 16.1 ng/mL — ABNORMAL LOW (ref 30.00–100.00)

## 2018-06-29 LAB — COMPREHENSIVE METABOLIC PANEL
ALT: 21 U/L (ref 0–53)
AST: 30 U/L (ref 0–37)
Albumin: 4.2 g/dL (ref 3.5–5.2)
Alkaline Phosphatase: 55 U/L (ref 39–117)
BUN: 14 mg/dL (ref 6–23)
CO2: 32 mEq/L (ref 19–32)
Calcium: 9.9 mg/dL (ref 8.4–10.5)
Chloride: 99 mEq/L (ref 96–112)
Creatinine, Ser: 0.85 mg/dL (ref 0.40–1.50)
GFR: 107.18 mL/min (ref >60.00)
Glucose, Bld: 101 mg/dL — ABNORMAL HIGH (ref 70–99)
Potassium: 4.3 mEq/L (ref 3.5–5.1)
SODIUM: 138 meq/L (ref 135–145)
Total Bilirubin: 1.6 mg/dL — ABNORMAL HIGH (ref 0.2–1.2)
Total Protein: 7.9 g/dL (ref 6.0–8.3)

## 2018-06-29 LAB — TSH: TSH: 3.51 u[IU]/mL (ref 0.35–4.50)

## 2018-06-29 MED ORDER — CHOLECALCIFEROL 1.25 MG (50000 UT) PO CAPS: 50000.0000 [IU] | ORAL_CAPSULE | ORAL | 0 refills | Status: DC

## 2018-06-29 MED ORDER — OLMESARTAN MEDOXOMIL 40 MG PO TABS: 40.0000 mg | ORAL_TABLET | Freq: Every day | ORAL | 0 refills | Status: DC

## 2018-06-29 MED ORDER — SPIRONOLACTONE 25 MG PO TABS: 25.0000 mg | ORAL_TABLET | Freq: Every day | ORAL | 0 refills | Status: DC

## 2018-06-29 MED ORDER — TORSEMIDE 20 MG PO TABS: 20.0000 mg | ORAL_TABLET | Freq: Every day | ORAL | 0 refills | Status: DC

## 2018-06-29 NOTE — Progress Notes (Signed)
Subjective:  Patient ID: Kurt Blair, male    DOB: 10-Jul-1945  Age: 73 y.o. MRN: [id number]  CC: Hypertension and Congestive Heart Failure   HPI Kurt Blair presents for a BP check - He is concerned that his blood pressure has not been adequately well controlled.  He tells me he is compliant with carvedilol and telmisartan.  He was previously prescribed a loop diuretic but he has not been taking it recently.  He complains of shortness of breath, edema, and weight gain.  He denies CP, diaphoresis, or palpitations.  Outpatient Medications Prior to Visit  Medication Sig Dispense Refill  . atorvastatin (LIPITOR) 80 MG tablet TAKE 1 TABLET (80 MG TOTAL) BY MOUTH DAILY. 90 tablet 1  . carvedilol (COREG) 12.5 MG tablet TAKE 1 TABLET (12.5 MG TOTAL) BY MOUTH 2 (TWO) TIMES DAILY WITH A MEAL. 180 tablet 2  . doxycycline (VIBRA-TABS) 100 MG tablet Take 1 tablet (100 mg total) by mouth 2 (two) times daily. 20 tablet 0  . fluticasone (FLONASE) 50 MCG/ACT nasal spray Place 2 sprays into both nostrils daily. 16 g 6  . Fluticasone-Salmeterol (WIXELA INHUB) 250-50 MCG/DOSE AEPB Inhale 1 puff into the lungs 2 (two) times daily.    Marland Kitchen KLOR-CON M20 20 MEQ tablet TAKE 1 TABLET BY MOUTH TWICE A DAY 180 tablet 2  . furosemide (LASIX) 40 MG tablet TAKE 1 TABLET BY MOUTH EVERY DAY 90 tablet 2  . olmesartan (BENICAR) 20 MG tablet TAKE 1 TABLET BY MOUTH EVERY DAY 90 tablet 1  . benzonatate (TESSALON) 100 MG capsule Take 1 capsule (100 mg total) by mouth 3 (three) times daily as needed. 20 capsule 0   No facility-administered medications prior to visit.     ROS Review of Systems  Constitutional: Positive for unexpected weight change (wt gain). Negative for diaphoresis and fatigue.  HENT: Negative.   Respiratory: Positive for shortness of breath. Negative for cough, chest tightness, wheezing and stridor.   Cardiovascular: Positive for leg swelling. Negative for chest pain and palpitations.  Gastrointestinal:  Negative for abdominal pain, diarrhea and nausea.  Endocrine: Negative.  Negative for cold intolerance and heat intolerance.  Genitourinary: Negative for difficulty urinating and hematuria.  Musculoskeletal: Negative.  Negative for arthralgias and myalgias.  Skin: Negative.  Negative for color change, pallor and rash.  Neurological: Negative.  Negative for dizziness, speech difficulty, weakness, light-headedness and numbness.  Hematological: Negative for adenopathy. Does not bruise/bleed easily.  Psychiatric/Behavioral: Negative.     Objective:  BP (!) 170/100 (BP Location: Left Arm, Patient Position: Sitting, Cuff Size: Large)   Pulse 60   Temp 97.7 F (36.5 C) (Oral)   Ht 5' 10"  (1.778 m)   Wt 258 lb 8 oz (117.3 kg)   SpO2 93%   BMI 37.09 kg/m   BP Readings from Last 3 Encounters:  06/29/18 (!) 170/100  06/22/18 (!) 160/98  06/10/18 (!) 162/78    Wt Readings from Last 3 Encounters:  06/29/18 258 lb 8 oz (117.3 kg)  06/22/18 260 lb (117.9 kg)  06/10/18 261 lb 12.8 oz (118.8 kg)    Physical Exam Constitutional:      Appearance: He is obese. He is not ill-appearing or diaphoretic.  HENT:     Nose: Nose normal.     Mouth/Throat:     Mouth: Mucous membranes are moist.     Pharynx: Oropharynx is clear.  Eyes:     Conjunctiva/sclera: Conjunctivae normal.  Neck:     Musculoskeletal: Normal  range of motion and neck supple.  Cardiovascular:     Rate and Rhythm: Normal rate and regular rhythm.     Pulses: Normal pulses.     Heart sounds: No murmur. No gallop.   Pulmonary:     Effort: Pulmonary effort is normal. No respiratory distress.     Breath sounds: Normal breath sounds. No stridor. No wheezing or rales.  Abdominal:     General: Bowel sounds are normal.     Palpations: There is no hepatomegaly, splenomegaly or mass.     Tenderness: There is no abdominal tenderness. There is no guarding.  Musculoskeletal:     Right lower leg: 1+ Edema present.     Left lower leg:  1+ Edema present.  Lymphadenopathy:     Cervical: No cervical adenopathy.  Skin:    General: Skin is warm and dry.     Coloration: Skin is not pale.  Neurological:     General: No focal deficit present.     Mental Status: Mental status is at baseline.     Lab Results  Component Value Date   WBC 5.8 06/29/2018   HGB 16.6 06/29/2018   HCT 49.2 06/29/2018   PLT 213.0 06/29/2018   GLUCOSE 101 (H) 06/29/2018   CHOL 115 09/22/2017   TRIG 121.0 09/22/2017   HDL 42.80 09/22/2017   LDLDIRECT 144.8 12/26/2010   LDLCALC 48 09/22/2017   ALT 21 06/29/2018   AST 30 06/29/2018   NA 138 06/29/2018   K 4.3 06/29/2018   CL 99 06/29/2018   CREATININE 0.85 06/29/2018   BUN 14 06/29/2018   CO2 32 06/29/2018   TSH 3.51 06/29/2018   PSA 1.46 09/22/2017   HGBA1C 5.9 09/22/2017    Dg Chest 2 View  Result Date: 06/10/2018 CLINICAL DATA:  Cough, congestion and shortness of breath EXAM: CHEST - 2 VIEW COMPARISON:  Chest x-ray 11/25/2017 and chest CT 03/24/2018 FINDINGS: The cardiac silhouette, mediastinal and hilar contours are within normal limits and stable. There is moderate tortuosity of the thoracic aorta. The right costophrenic angle is cut off the bottom edge of the film. No definite infiltrates, edema or pulmonary lesions. Stable left pleural thickening and evidence of remote left-sided thoracic trauma. The bony thorax is intact. Stable degenerative changes involving the thoracic spine. IMPRESSION: No acute cardiopulmonary findings. Remote posttraumatic changes involving the left hemithorax. Electronically Signed   By: Marijo Sanes M.D.   On: 06/10/2018 15:00    Assessment & Plan:   Kurt Blair was seen today for hypertension and congestive heart failure.  Diagnoses and all orders for this visit:  Hypothyroidism, unspecified type- His TSH is in the normal range.  Thyroid replacement therapy is not indicated. -     TSH; Future  Essential hypertension- His blood pressure is not adequately  well controlled.  I have asked him to double the dose of all losartan and to restart a loop diuretic. -     Comprehensive metabolic panel; Future -     CBC with Differential/Platelet; Future -     torsemide (DEMADEX) 20 MG tablet; Take 1 tablet (20 mg total) by mouth daily. -     spironolactone (ALDACTONE) 25 MG tablet; Take 1 tablet (25 mg total) by mouth daily. -     VITAMIN D 25 Hydroxy (Vit-D Deficiency, Fractures); Future -     olmesartan (BENICAR) 40 MG tablet; Take 1 tablet (40 mg total) by mouth daily.  CHF with left ventricular diastolic dysfunction, NYHA class 1 ([place])-  He has signs of fluid overload.  I have asked him to restart a loop diuretic. -     Comprehensive metabolic panel; Future -     torsemide (DEMADEX) 20 MG tablet; Take 1 tablet (20 mg total) by mouth daily. -     spironolactone (ALDACTONE) 25 MG tablet; Take 1 tablet (25 mg total) by mouth daily. -     olmesartan (BENICAR) 40 MG tablet; Take 1 tablet (40 mg total) by mouth daily.  Vitamin D deficiency disease -     Cholecalciferol 1.25 MG (50000 UT) capsule; Take 1 capsule (50,000 Units total) by mouth once a week.   I have discontinued Rush Farmer. [place]'s furosemide, olmesartan, and benzonatate. I am also having him start on torsemide, spironolactone, olmesartan, and Cholecalciferol. Additionally, I am having him maintain his Fluticasone-Salmeterol, atorvastatin, carvedilol, KLOR-CON M20, doxycycline, and fluticasone.  Meds ordered this encounter  Medications  . torsemide (DEMADEX) 20 MG tablet    Sig: Take 1 tablet (20 mg total) by mouth daily.    Dispense:  90 tablet    Refill:  0  . spironolactone (ALDACTONE) 25 MG tablet    Sig: Take 1 tablet (25 mg total) by mouth daily.    Dispense:  90 tablet    Refill:  0  . olmesartan (BENICAR) 40 MG tablet    Sig: Take 1 tablet (40 mg total) by mouth daily.    Dispense:  90 tablet    Refill:  0  . Cholecalciferol 1.25 MG (50000 UT) capsule    Sig: Take 1 capsule  (50,000 Units total) by mouth once a week.    Dispense:  12 capsule    Refill:  0     Follow-up: Return in about 6 weeks (around 08/10/2018).  Scarlette Calico, MD

## 2018-06-29 NOTE — Patient Instructions (Signed)

## 2018-07-20 ENCOUNTER — Other Ambulatory Visit: Payer: Self-pay | Admitting: Endocrinology

## 2018-07-20 ENCOUNTER — Other Ambulatory Visit: Payer: Self-pay | Admitting: Internal Medicine

## 2018-07-22 DIAGNOSIS — H2513 Age-related nuclear cataract, bilateral: Secondary | ICD-10-CM | POA: Diagnosis not present

## 2018-07-22 DIAGNOSIS — H524 Presbyopia: Secondary | ICD-10-CM | POA: Diagnosis not present

## 2018-07-29 ENCOUNTER — Encounter: Payer: Self-pay | Admitting: Gastroenterology

## 2018-08-11 ENCOUNTER — Ambulatory Visit: Payer: Medicare Other | Admitting: Internal Medicine

## 2018-08-25 ENCOUNTER — Ambulatory Visit: Payer: Medicare Other | Admitting: Internal Medicine

## 2018-08-25 NOTE — Progress Notes (Signed)
Virtual Visit via Video Note  I connected with Kurt Blair on 08/25/18 at  2:00 PM EDT by a video enabled telemedicine application and verified that I am speaking with the correct person using two identifiers.   I discussed the limitations of evaluation and management by telemedicine and the availability of in person appointments. The patient expressed understanding and agreed to proceed.  History of Present Illness: he did not check in for a virtual visit    Observations/Objective:  Lab Results  Component Value Date   WBC 5.8 06/29/2018   HGB 16.6 06/29/2018   HCT 49.2 06/29/2018   PLT 213.0 06/29/2018   GLUCOSE 101 (H) 06/29/2018   CHOL 115 09/22/2017   TRIG 121.0 09/22/2017   HDL 42.80 09/22/2017   LDLDIRECT 144.8 12/26/2010   LDLCALC 48 09/22/2017   ALT 21 06/29/2018   AST 30 06/29/2018   NA 138 06/29/2018   K 4.3 06/29/2018   CL 99 06/29/2018   CREATININE 0.85 06/29/2018   BUN 14 06/29/2018   CO2 32 06/29/2018   TSH 3.51 06/29/2018   PSA 1.46 09/22/2017   HGBA1C 5.9 09/22/2017     Assessment and Plan:   Follow Up Instructions:    I discussed the assessment and treatment plan with the patient. The patient was provided an opportunity to ask questions and all were answered. The patient agreed with the plan and demonstrated an understanding of the instructions.   The patient was advised to call back or seek an in-person evaluation if the symptoms worsen or if the condition fails to improve as anticipated.  I provided 0 minutes of non-face-to-face time during this encounter.   Scarlette Calico, MD

## 2018-09-05 ENCOUNTER — Ambulatory Visit (INDEPENDENT_AMBULATORY_CARE_PROVIDER_SITE_OTHER): Payer: Medicare Other | Admitting: Internal Medicine

## 2018-09-05 ENCOUNTER — Encounter: Payer: Self-pay | Admitting: Internal Medicine

## 2018-09-05 VITALS — BP 118/73 | HR 64

## 2018-09-05 DIAGNOSIS — I10 Essential (primary) hypertension: Secondary | ICD-10-CM

## 2018-09-05 DIAGNOSIS — I5030 Unspecified diastolic (congestive) heart failure: Secondary | ICD-10-CM

## 2018-09-05 NOTE — Progress Notes (Signed)
Virtual Visit via Video Note  I connected with Kurt Blair on 09/05/18 at  1:00 PM EDT by a video enabled telemedicine application and verified that I am speaking with the correct person using two identifiers.   I discussed the limitations of evaluation and management by telemedicine and the availability of in person appointments. The patient expressed understanding and agreed to proceed.  History of Present Illness: He checked in for virtual visit.  He was not willing to come in for an in person visit due to the COVID-19 pandemic.  He complains that for the last few weeks that he has been feeling weak, dizzy, and lightheaded.  He tells me his blood pressure has been down to 113/74 and 118/73.  He tells me he is compliant with torsemide and spironolactone.  He has continued his walking program and denies any recent episodes of CP, DOE, palpitations, edema, or fatigue.    Observations/Objective: There was no evidence of respiratory or any other acute distress.  He was calm, cooperative, and appropriate.  His speech was normal and fluent.  His skin color was normal.  Lab Results  Component Value Date   WBC 5.8 06/29/2018   HGB 16.6 06/29/2018   HCT 49.2 06/29/2018   PLT 213.0 06/29/2018   GLUCOSE 101 (H) 06/29/2018   CHOL 115 09/22/2017   TRIG 121.0 09/22/2017   HDL 42.80 09/22/2017   LDLDIRECT 144.8 12/26/2010   LDLCALC 48 09/22/2017   ALT 21 06/29/2018   AST 30 06/29/2018   NA 138 06/29/2018   K 4.3 06/29/2018   CL 99 06/29/2018   CREATININE 0.85 06/29/2018   BUN 14 06/29/2018   CO2 32 06/29/2018   TSH 3.51 06/29/2018   PSA 1.46 09/22/2017   HGBA1C 5.9 09/22/2017     Assessment and Plan: He is taking potassium sparing and loop diuretics for the management of CHF and hypertension.  He is not tolerating this well due to symptomatic hypotension.  I therefore asked him to stop taking the current dose of torsemide (20 mg a day).  I have asked him to keep taking the current dose of  spironolactone.  I have asked him to come in to our lab to check his renal function and electrolytes.  If he starts retaining fluid or his blood pressure gets up around 130/80 then I will restart torsemide but at the 10 mg a day dose.   Follow Up Instructions: He will let me know if he develops any new or worsening symptoms.  He agrees to comply with the above recommendations.  He agrees to come in to have his kidney function electrolytes tested.    I discussed the assessment and treatment plan with the patient. The patient was provided an opportunity to ask questions and all were answered. The patient agreed with the plan and demonstrated an understanding of the instructions.   The patient was advised to call back or seek an in-person evaluation if the symptoms worsen or if the condition fails to improve as anticipated.  I provided 25 minutes of non-face-to-face time during this encounter.   Scarlette Calico, MD

## 2018-09-06 ENCOUNTER — Encounter: Payer: Self-pay | Admitting: Internal Medicine

## 2018-09-06 ENCOUNTER — Other Ambulatory Visit (INDEPENDENT_AMBULATORY_CARE_PROVIDER_SITE_OTHER): Payer: Medicare Other

## 2018-09-06 DIAGNOSIS — I5030 Unspecified diastolic (congestive) heart failure: Secondary | ICD-10-CM | POA: Diagnosis not present

## 2018-09-06 DIAGNOSIS — I10 Essential (primary) hypertension: Secondary | ICD-10-CM | POA: Diagnosis not present

## 2018-09-06 LAB — BASIC METABOLIC PANEL
BUN: 29 mg/dL — ABNORMAL HIGH (ref 6–23)
CO2: 25 mEq/L (ref 19–32)
Calcium: 9.4 mg/dL (ref 8.4–10.5)
Chloride: 102 mEq/L (ref 96–112)
Creatinine, Ser: 1.17 mg/dL (ref 0.40–1.50)
GFR: 74.09 mL/min (ref >60.00)
Glucose, Bld: 102 mg/dL — ABNORMAL HIGH (ref 70–99)
Potassium: 4.3 mEq/L (ref 3.5–5.1)
Sodium: 136 mEq/L (ref 135–145)

## 2018-09-24 ENCOUNTER — Other Ambulatory Visit: Payer: Self-pay | Admitting: Internal Medicine

## 2018-09-24 DIAGNOSIS — I10 Essential (primary) hypertension: Secondary | ICD-10-CM

## 2018-09-24 DIAGNOSIS — I5030 Unspecified diastolic (congestive) heart failure: Secondary | ICD-10-CM

## 2018-09-29 NOTE — Telephone Encounter (Signed)
Copied from [place] [phone]. Topic: General - Inquiry >> Sep 29, 2018  4:51 PM Lionel December wrote: Reason for CRM: Pt was seen about a month ago and Dr Ronnald Ramp took him off the medication  torsemide (20 mg a day). He stated his blood pressure is now elevated. Would like a call back

## 2018-10-07 ENCOUNTER — Other Ambulatory Visit: Payer: Self-pay | Admitting: Internal Medicine

## 2018-12-17 ENCOUNTER — Other Ambulatory Visit: Payer: Self-pay | Admitting: Internal Medicine

## 2018-12-17 DIAGNOSIS — I10 Essential (primary) hypertension: Secondary | ICD-10-CM

## 2018-12-17 DIAGNOSIS — I5030 Unspecified diastolic (congestive) heart failure: Secondary | ICD-10-CM

## 2019-01-02 ENCOUNTER — Other Ambulatory Visit: Payer: Self-pay | Admitting: Internal Medicine

## 2019-01-02 DIAGNOSIS — I5030 Unspecified diastolic (congestive) heart failure: Secondary | ICD-10-CM

## 2019-01-02 DIAGNOSIS — I10 Essential (primary) hypertension: Secondary | ICD-10-CM

## 2019-01-10 ENCOUNTER — Other Ambulatory Visit: Payer: Self-pay

## 2019-01-10 ENCOUNTER — Ambulatory Visit (INDEPENDENT_AMBULATORY_CARE_PROVIDER_SITE_OTHER): Payer: Medicare Other | Admitting: Sports Medicine

## 2019-01-10 ENCOUNTER — Encounter: Payer: Self-pay | Admitting: Sports Medicine

## 2019-01-10 DIAGNOSIS — M1711 Unilateral primary osteoarthritis, right knee: Secondary | ICD-10-CM

## 2019-01-10 MED ORDER — METHYLPREDNISOLONE ACETATE 40 MG/ML IJ SUSP
40.0000 mg | Freq: Once | INTRAMUSCULAR | Status: AC
  Administered 2019-01-10: 14:00:00 40 mg via INTRA_ARTICULAR

## 2019-01-10 NOTE — Progress Notes (Signed)
Subjective:    Patient ID: Kurt Blair, male    DOB: 18-Dec-1945, 73 y.o.   MRN: [id number]  HPI  73 year old male who presents with right knee pain.  Patient has known right knee arthritis diagnosed by an office ultrasound back in 2018.  He has been getting intermittent steroid injections since that time.  Last steroid injection was back in January 2020.  He states that the injection did improve his pain significantly for around 3 months but wore off.  He states that he would have come earlier but he tried to hold off as long as possible secondary to coronavirus.  He states that he has right knee pain usually with walking.  He will occasionally feel the pain while sitting.  He describes it as a dull aching pain in the antero-medial portion of his right knee.  Says the pain is usually 4 out of 10 but can get worse after that.  Patient also underwent right shoulder injection at that visit back in January.  He was having some limited range of motion external rotation and abduction.  He states that this pain has resolved completely and he has no limitations.  Review of Systems  Per HPI     Objective:   Physical Exam General: Well-appearing African-American male, no acute distress, resting comfortably in chair Respiratory: Able speak in clear coherent sentences, no sensory muscle use, no distress Cardio: Skin warm and dry.  1+ pitting edema bilateral lower extremity.  Right knee Inspection: Mild swelling of right knee as compared to left knee.  Very mild varus deformity of the right knee as compared to left. Palpation: Crepitus noted with flexion extension of the knee.  Very mild tenderness to palpation noted anterior medial knee at the joint line. Range of motion: Range of motion full intact to flexion and extension.  Mildly limited range of motion on knee external and internal rotation. Stability: No instability noted on exam. Strength: 5/5 strength to knee flexion extension Neurovascular:  Sensation intact, skin warm and dry. Special test: Positive Thessaly test.  Negative anterior drawer.  Negative McMurray  Right shoulder Inspection: No focal asymmetry as compared to left shoulder Palpation: No tenderness to palpation over entirety of joint. Range of motion: Range of motion fully intact in all planes Strength, neurovascular, special test: not evaluated     Assessment & Plan:  73 year old male who presents for right knee pain.  Knee pain secondary to osteoarthritis.  Patient states that the steroid injection did help his pain for approximately 3 months but slowly wore off.  Discussed possibility of getting x-rays of the knee to better evaluate.  Patient opted to just get injection today and if it stops working as well as it has in the past he will follow-up and likely get x-rays at that time.  Perform steroid injection at this visit.  Patient's right shoulder pain has essentially completely resolved from the last visit.  Likely had some aspect of rotator cuff tendinitis versus bursitis.  Right knee -Right knee intra-articular corticosteroid injection performed -Patient to follow-up PRN for pain -If injections stop working so well likely need x-rays to evaluate for possible knee replacement down the road  Right Knee Injection Procedure note Consent obtained and verified. Timeout conducted Right knee palpated until landmarks sufficiently established for a right anterolateral knee injection.  A small mark was placed with the cap of a pen.  The area was sterilized with alcohol swab x3.  A topical analgesic spray was used  until the area was sufficiently numb.  A 25-gauge 1.5 inch needle was passed into the joint space.  A mixture consisting of 40 mg Depo-Medrol and 3 mL lidocaine was injected in the joint space.  A 4 x 4 gauze was placed over the area until hemostasis satisfactorily achieved. A bandage was placed over the area. Patient was able to walk out of clinic with no  difficulty.  Right shoulder - follow up prn for shoulder pain  Guadalupe Dawn MD PGY-3 Family Medicine Resident  Patient seen and evaluated with the resident.  I agree with the above plan of care.  Patient's right knee was injected today with cortisone.  He has been getting his knee injected every 3 to 6 months.  I did discuss the possibility of an x-ray if today's cortisone injection is ineffective.  Follow-up as needed.

## 2019-01-11 ENCOUNTER — Encounter: Payer: Self-pay | Admitting: Internal Medicine

## 2019-01-11 ENCOUNTER — Ambulatory Visit (INDEPENDENT_AMBULATORY_CARE_PROVIDER_SITE_OTHER): Payer: Medicare Other | Admitting: Internal Medicine

## 2019-01-11 VITALS — BP 138/80 | HR 65 | Temp 99.0°F | Ht 70.0 in | Wt 265.0 lb

## 2019-01-11 DIAGNOSIS — L240 Irritant contact dermatitis due to detergents: Secondary | ICD-10-CM | POA: Diagnosis not present

## 2019-01-11 DIAGNOSIS — Z23 Encounter for immunization: Secondary | ICD-10-CM

## 2019-01-11 MED ORDER — FLUOCINONIDE 0.05 % EX CREA: 1.0000 "application " | TOPICAL_CREAM | Freq: Two times a day (BID) | CUTANEOUS | 1 refills | Status: DC

## 2019-01-11 MED ORDER — METHYLPREDNISOLONE ACETATE 80 MG/ML IJ SUSP
120.0000 mg | Freq: Once | INTRAMUSCULAR | Status: AC
  Administered 2019-01-11: 16:00:00 120 mg via INTRAMUSCULAR

## 2019-01-11 MED ORDER — LEVOCETIRIZINE DIHYDROCHLORIDE 5 MG PO TABS: 5.0000 mg | ORAL_TABLET | Freq: Every evening | ORAL | 0 refills | Status: DC

## 2019-01-11 NOTE — Patient Instructions (Signed)
Contact Dermatitis Dermatitis is redness, soreness, and swelling (inflammation) of the skin. Contact dermatitis is a reaction to certain substances that touch the skin. Many different substances can cause contact dermatitis. There are two types of contact dermatitis:  Irritant contact dermatitis. This type is caused by something that irritates your skin, such as having dry hands from washing them too often with soap. This type does not require previous exposure to the substance for a reaction to occur. This is the most common type.  Allergic contact dermatitis. This type is caused by a substance that you are allergic to, such as poison ivy. This type occurs when you have been exposed to the substance (allergen) and develop a sensitivity to it. Dermatitis may develop soon after your first exposure to the allergen, or it may not develop until the next time you are exposed and every time thereafter. What are the causes? Irritant contact dermatitis is most commonly caused by exposure to:  Makeup.  Soaps.  Detergents.  Bleaches.  Acids.  Metal salts, such as nickel. Allergic contact dermatitis is most commonly caused by exposure to:  Poisonous plants.  Chemicals.  Jewelry.  Latex.  Medicines.  Preservatives in products, such as clothing. What increases the risk? You are more likely to develop this condition if you have:  A job that exposes you to irritants or allergens.  Certain medical conditions, such as asthma or eczema. What are the signs or symptoms? Symptoms of this condition may occur on your body anywhere the irritant has touched you or is touched by you.  Symptoms include: ? Dryness or flaking. ? Redness. ? Cracks. ? Itching. ? Pain or a burning feeling. ? Blisters. ? Drainage of small amounts of blood or clear fluid from skin cracks. With allergic contact dermatitis, there may also be swelling in areas such as the eyelids, mouth, or genitals. How is this  diagnosed? This condition is diagnosed with a medical history and physical exam.  A patch skin test may be performed to help determine the cause.  If the condition is related to your job, you may need to see an occupational medicine specialist. How is this treated? This condition is treated by checking for the cause of the reaction and protecting your skin from further contact. Treatment may also include:  Steroid creams or ointments. Oral steroid medicines may be needed in more severe cases.  Antibiotic medicines or antibacterial ointments, if a skin infection is present.  Antihistamine lotion or an antihistamine taken by mouth to ease itching.  A bandage (dressing). Follow these instructions at home: Skin care  Moisturize your skin as needed.  Apply cool compresses to the affected areas.  Try applying baking soda paste to your skin. Stir water into baking soda until it reaches a paste-like consistency.  Do not scratch your skin, and avoid friction to the affected area.  Avoid the use of soaps, perfumes, and dyes. Medicines  Take or apply over-the-counter and prescription medicines only as told by your health care provider.  If you were prescribed an antibiotic medicine, take or apply the antibiotic as told by your health care provider. Do not stop using the antibiotic even if your condition improves. Bathing  Try taking a bath with: ? Epsom salts. Follow the instructions on the packaging. You can get these at your local pharmacy or grocery store. ? Baking soda. Pour a small amount into the bath as directed by your health care provider. ? Colloidal oatmeal. Follow the instructions on the  packaging. You can get this at your local pharmacy or grocery store.  Bathe less frequently, such as every other day.  Bathe in lukewarm water. Avoid using hot water. Bandage care  If you were given a bandage (dressing), change it as told by your health care provider.  Wash your hands  with soap and water before and after you change your dressing. If soap and water are not available, use hand sanitizer. General instructions  Avoid the substance that caused your reaction. If you do not know what caused it, keep a journal to try to track what caused it. Write down: ? What you eat. ? What cosmetic products you use. ? What you drink. ? What you wear in the affected area. This includes jewelry.  More redness, swelling, or pain.  More fluid or blood.  Warmth.  Pus or a bad smell.  Keep all follow-up visits as told by your health care provider. This is important. Contact a health care provider if:  Your condition does not improve with treatment.  Your condition gets worse.  You have signs of infection such as swelling, tenderness, redness, soreness, or warmth in the affected area.  You have a fever.  You have new symptoms. Get help right away if:  You have a severe headache, neck pain, or neck stiffness.  You vomit.  You feel very sleepy.  You notice red streaks coming from the affected area.  Your bone or joint underneath the affected area becomes painful after the skin has healed.  The affected area turns darker.  You have difficulty breathing. Summary  Dermatitis is redness, soreness, and swelling (inflammation) of the skin. Contact dermatitis is a reaction to certain substances that touch the skin.  Symptoms of this condition may occur on your body anywhere the irritant has touched you or is touched by you.  This condition is treated by figuring out what caused the reaction and protecting your skin from further contact. Treatment may also include medicines and skin care.  Avoid the substance that caused your reaction. If you do not know what caused it, keep a journal to try to track what caused it.  Contact a health care provider if your condition gets worse or you have signs of infection such as swelling, tenderness, redness, soreness, or warmth  in the affected area. This information is not intended to replace advice given to you by your health care provider. Make sure you discuss any questions you have with your health care provider. Document Released: 04/24/2000 Document Revised: 08/17/2018 Document Reviewed: 11/10/2017 Elsevier Patient Education  2020 [employer].

## 2019-01-11 NOTE — Progress Notes (Signed)
Subjective:  Patient ID: Kurt Blair, male    DOB: [birth year]  Age: 73 y.o. MRN: [id number]  CC: Rash   HPI Kurt Blair presents for concerns about a rash.  He complains of a 10-day history of itchy rash.  He has noticed the rash on his right lower abdomen, his left thigh, and his upper back.  He thinks it's related to a new detergent in his laundry.  He has treated this with topical hydrocortisone cream and has gotten minimal symptom relief.  Outpatient Medications Prior to Visit  Medication Sig Dispense Refill  . atorvastatin (LIPITOR) 80 MG tablet TAKE 1 TABLET (80 MG TOTAL) BY MOUTH DAILY. 90 tablet 1  . carvedilol (COREG) 12.5 MG tablet TAKE 1 TABLET (12.5 MG TOTAL) BY MOUTH 2 (TWO) TIMES DAILY WITH A MEAL. 180 tablet 2  . Cholecalciferol 1.25 MG (50000 UT) capsule Take 1 capsule (50,000 Units total) by mouth once a week. 12 capsule 0  . fluticasone (FLONASE) 50 MCG/ACT nasal spray Place 2 sprays into both nostrils daily. 16 g 6  . Fluticasone-Salmeterol (WIXELA INHUB) 250-50 MCG/DOSE AEPB Inhale 1 puff into the lungs 2 (two) times daily.    Marland Kitchen KLOR-CON M20 20 MEQ tablet TAKE 1 TABLET BY MOUTH TWICE A DAY 180 tablet 2  . olmesartan (BENICAR) 40 MG tablet TAKE 1 TABLET BY MOUTH EVERY DAY 90 tablet 0  . spironolactone (ALDACTONE) 25 MG tablet TAKE 1 TABLET BY MOUTH EVERY DAY 90 tablet 0  . torsemide (DEMADEX) 20 MG tablet TAKE 1 TABLET BY MOUTH EVERY DAY 90 tablet 0   No facility-administered medications prior to visit.     ROS Review of Systems  Constitutional: Negative.  Negative for diaphoresis and fatigue.  HENT: Negative.   Eyes: Negative for visual disturbance.  Respiratory: Negative for cough, chest tightness, shortness of breath and wheezing.   Cardiovascular: Negative for chest pain, palpitations and leg swelling.  Gastrointestinal: Negative for abdominal pain and diarrhea.  Endocrine: Negative.   Genitourinary: Negative.  Negative for difficulty urinating.   Musculoskeletal: Positive for arthralgias. Negative for back pain, myalgias and neck pain.  Skin: Positive for rash. Negative for color change.  Neurological: Negative.  Negative for dizziness and weakness.  Hematological: Negative for adenopathy. Does not bruise/bleed easily.  Psychiatric/Behavioral: Negative.     Objective:  BP 138/80 (BP Location: Left Arm, Patient Position: Sitting, Cuff Size: Large)   Pulse 65   Temp 99 F (37.2 C) (Oral)   Ht 5' 10"  (1.778 m)   Wt 265 lb (120.2 kg)   SpO2 94%   BMI 38.02 kg/m   BP Readings from Last 3 Encounters:  01/11/19 138/80  01/10/19 138/83  09/05/18 118/73    Wt Readings from Last 3 Encounters:  01/11/19 265 lb (120.2 kg)  01/10/19 255 lb (115.7 kg)  06/29/18 258 lb 8 oz (117.3 kg)    Physical Exam Vitals signs reviewed.  Constitutional:      General: He is not in acute distress.    Appearance: He is not ill-appearing, toxic-appearing or diaphoretic.  HENT:     Nose: Nose normal.     Mouth/Throat:     Mouth: Mucous membranes are moist.  Eyes:     General: No scleral icterus.    Conjunctiva/sclera: Conjunctivae normal.  Neck:     Musculoskeletal: Normal range of motion. No neck rigidity or muscular tenderness.  Cardiovascular:     Rate and Rhythm: Normal rate and  regular rhythm.     Heart sounds: No murmur.  Pulmonary:     Effort: Pulmonary effort is normal.     Breath sounds: No stridor. No wheezing, rhonchi or rales.  Abdominal:     General: Abdomen is flat. There is no distension.     Palpations: There is no mass.     Tenderness: There is no abdominal tenderness. There is no guarding.  Musculoskeletal: Normal range of motion.     Right lower leg: No edema.     Left lower leg: No edema.  Lymphadenopathy:     Cervical: No cervical adenopathy.  Skin:    General: Skin is warm.     Findings: Rash present. No petechiae. Rash is macular. Rash is not nodular, papular, purpuric, pustular, scaling, urticarial or  vesicular.     Comments: There are diffused, scabbed, excoriations on his upper back.  See photo.  On the right lower abdomen there are coalesced macules that have slight blanching and xerosis. See photo.  Neurological:     General: No focal deficit present.     Mental Status: He is alert.  Psychiatric:        Mood and Affect: Mood normal.        Behavior: Behavior normal.     Lab Results  Component Value Date   WBC 5.8 06/29/2018   HGB 16.6 06/29/2018   HCT 49.2 06/29/2018   PLT 213.0 06/29/2018   GLUCOSE 102 (H) 09/06/2018   CHOL 115 09/22/2017   TRIG 121.0 09/22/2017   HDL 42.80 09/22/2017   LDLDIRECT 144.8 12/26/2010   LDLCALC 48 09/22/2017   ALT 21 06/29/2018   AST 30 06/29/2018   NA 136 09/06/2018   K 4.3 09/06/2018   CL 102 09/06/2018   CREATININE 1.17 09/06/2018   BUN 29 (H) 09/06/2018   CO2 25 09/06/2018   TSH 3.51 06/29/2018   PSA 1.46 09/22/2017   HGBA1C 5.9 09/22/2017    Dg Chest 2 View  Result Date: 06/10/2018 CLINICAL DATA:  Cough, congestion and shortness of breath EXAM: CHEST - 2 VIEW COMPARISON:  Chest x-ray 11/25/2017 and chest CT 03/24/2018 FINDINGS: The cardiac silhouette, mediastinal and hilar contours are within normal limits and stable. There is moderate tortuosity of the thoracic aorta. The right costophrenic angle is cut off the bottom edge of the film. No definite infiltrates, edema or pulmonary lesions. Stable left pleural thickening and evidence of remote left-sided thoracic trauma. The bony thorax is intact. Stable degenerative changes involving the thoracic spine. IMPRESSION: No acute cardiopulmonary findings. Remote posttraumatic changes involving the left hemithorax. Electronically Signed   By: Marijo Sanes M.D.   On: 06/10/2018 15:00    Assessment & Plan:   Kurt Blair was seen today for rash.  Diagnoses and all orders for this visit:  Need for influenza vaccination -     Flu Vaccine QUAD High Dose(Fluad)  Irritant contact dermatitis  due to detergent- He agrees to change his detergent and to wash all of his clothes thoroughly.  I have asked him to take a daily oral antihistamine, use a topical steroid, and I gave him an injection of methylprednisolone. -     fluocinonide cream (LIDEX) 0.05 %; Apply 1 application topically 2 (two) times daily. -     levocetirizine (XYZAL) 5 MG tablet; Take 1 tablet (5 mg total) by mouth every evening. -     methylPREDNISolone acetate (DEPO-MEDROL) injection 120 mg  Need for pneumococcal vaccination -  Pneumococcal polysaccharide vaccine 23-valent greater than or equal to 2yo subcutaneous/IM   I am having Kyung Rudd E. Goody start on fluocinonide cream and levocetirizine. I am also having him maintain his Fluticasone-Salmeterol, carvedilol, Klor-Con M20, fluticasone, Cholecalciferol, torsemide, atorvastatin, spironolactone, and olmesartan. We administered methylPREDNISolone acetate.  Meds ordered this encounter  Medications  . fluocinonide cream (LIDEX) 0.05 %    Sig: Apply 1 application topically 2 (two) times daily.    Dispense:  120 g    Refill:  1  . levocetirizine (XYZAL) 5 MG tablet    Sig: Take 1 tablet (5 mg total) by mouth every evening.    Dispense:  30 tablet    Refill:  0  . methylPREDNISolone acetate (DEPO-MEDROL) injection 120 mg     Follow-up: Return in about 3 weeks (around 02/01/2019).  Scarlette Calico, MD

## 2019-01-12 ENCOUNTER — Telehealth: Payer: Self-pay | Admitting: Internal Medicine

## 2019-01-12 NOTE — Telephone Encounter (Signed)
Is there an alternative Wixela INHUB

## 2019-01-12 NOTE — Telephone Encounter (Signed)
Patient needs a different medication due to insurance being too high. Fluticasone-Salmeterol (WIXELA INHUB) 250 Please call patient and let him know when and what medication was sent to pharmacy  Patient call back [phone]  [id number] [place], [place] - [phone](Phone) [phone](Fax

## 2019-01-12 NOTE — Telephone Encounter (Signed)
What does his insurance prefer?  TJ

## 2019-01-13 ENCOUNTER — Other Ambulatory Visit: Payer: Self-pay | Admitting: Internal Medicine

## 2019-01-13 DIAGNOSIS — L240 Irritant contact dermatitis due to detergents: Secondary | ICD-10-CM

## 2019-01-13 MED ORDER — TRIAMCINOLONE ACETONIDE 0.5 % EX CREA: 1.0000 "application " | TOPICAL_CREAM | Freq: Three times a day (TID) | CUTANEOUS | 1 refills | Status: DC

## 2019-01-13 NOTE — Telephone Encounter (Signed)
The rx that is too expensive is the Lidex cream is there an alternative to that?

## 2019-01-13 NOTE — Telephone Encounter (Addendum)
Pt number contacted and spouse answered. Informed spouse we are requesting pt to contact insurance company to find out what insurance will cover.   Spouse thinks that it was the cream that was too expensive.

## 2019-01-14 ENCOUNTER — Other Ambulatory Visit: Payer: Self-pay | Admitting: Internal Medicine

## 2019-01-14 DIAGNOSIS — I10 Essential (primary) hypertension: Secondary | ICD-10-CM

## 2019-01-14 DIAGNOSIS — I5030 Unspecified diastolic (congestive) heart failure: Secondary | ICD-10-CM

## 2019-01-17 NOTE — Telephone Encounter (Signed)
Pt stated he was told by his pharmacy that a prior authorization is needed. Pt requests call back. Cb# [phone]

## 2019-01-18 MED ORDER — TORSEMIDE 20 MG PO TABS: 20.0000 mg | ORAL_TABLET | Freq: Every day | ORAL | 1 refills | Status: DC

## 2019-01-18 NOTE — Telephone Encounter (Signed)
Request for furosemide declined. Pt is on torsemide. rf rq sent to PCP.

## 2019-01-27 ENCOUNTER — Other Ambulatory Visit: Payer: Self-pay | Admitting: Endocrinology

## 2019-01-27 NOTE — Telephone Encounter (Signed)
Please forward refill request to pt's primary care provider.   

## 2019-01-27 NOTE — Telephone Encounter (Signed)
Please advise if you wish to refill or send to PCP

## 2019-01-27 NOTE — Telephone Encounter (Signed)
Per Dr. Cordelia Pen request, I am forwarding this refill request. Please review and refill if appropriate

## 2019-02-04 ENCOUNTER — Other Ambulatory Visit: Payer: Self-pay | Admitting: Internal Medicine

## 2019-02-04 DIAGNOSIS — L240 Irritant contact dermatitis due to detergents: Secondary | ICD-10-CM

## 2019-02-15 ENCOUNTER — Other Ambulatory Visit: Payer: Self-pay | Admitting: Endocrinology

## 2019-02-15 NOTE — Telephone Encounter (Signed)
Please forward refill request to pt's primary care provider.   

## 2019-02-15 NOTE — Telephone Encounter (Signed)
Please advise 

## 2019-02-15 NOTE — Telephone Encounter (Signed)
Per Dr. Cordelia Pen request, I am forwarding you this refill request. Please review and refill if appropriate

## 2019-02-28 IMAGING — CT CT CHEST HIGH RESOLUTION W/O CM
2 of 6 series · 15 of 36 positions shown, 18 images · non-contrast
Comparison: Chest CT 09/25/2017.

CLINICAL DATA: 72-year-old male with 8 month history of shortness
of breath. Evaluate for potential interstitial lung disease.

EXAM:
CT CHEST WITHOUT CONTRAST
TECHNIQUE: Multidetector CT imaging of the chest was performed following the
standard protocol without intravenous contrast. High resolution
imaging of the lungs, as well as inspiratory and expiratory imaging,
was performed.

[Series 2: high resolution · axial · 0.71mm/px · z∈[-325,-41]mm · 12 of 158 slices shown, 15 images]
[im 8/158  mediastinal]
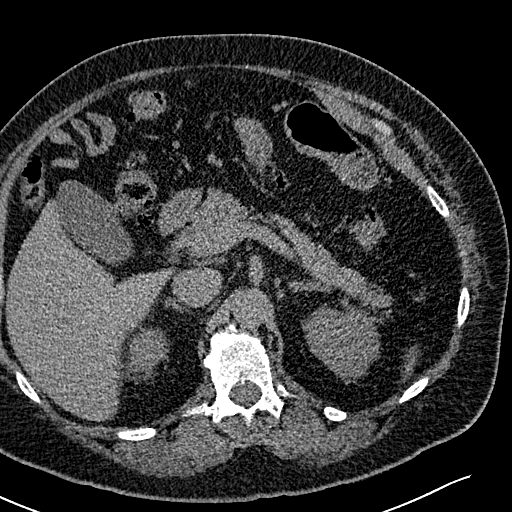
[im 8/158  lung]
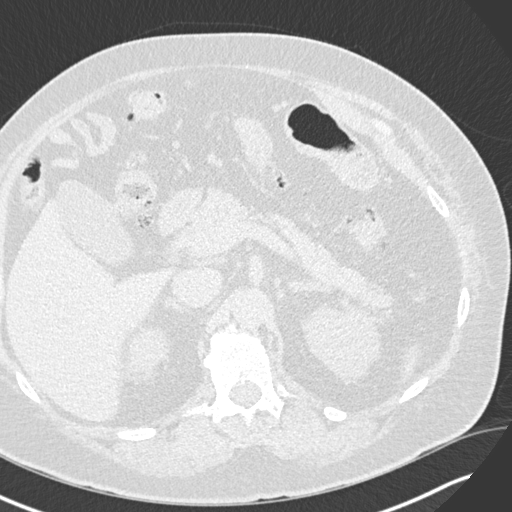
[im 23/158  lung]
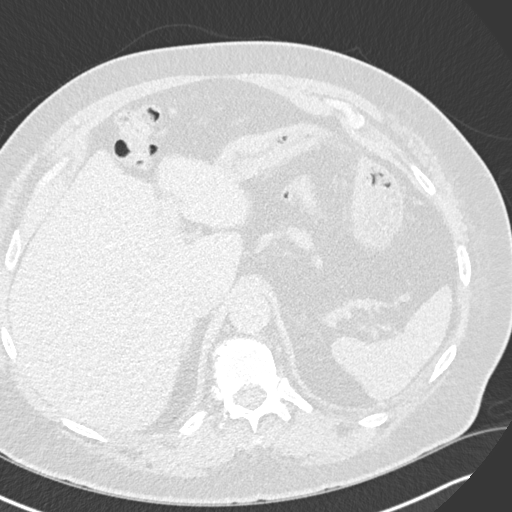
[im 38/158  lung]
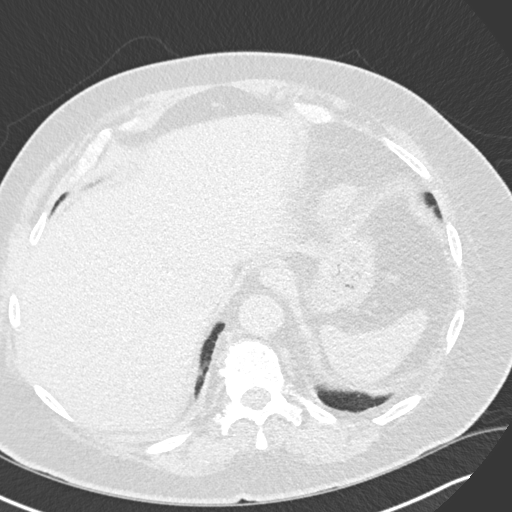
[im 45/158  lung]
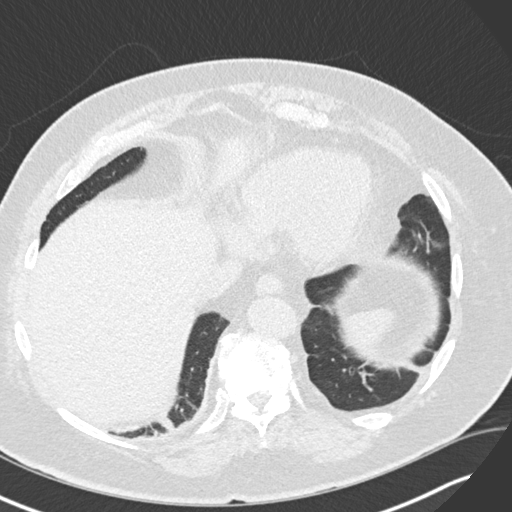
[im 60/158  mediastinal]
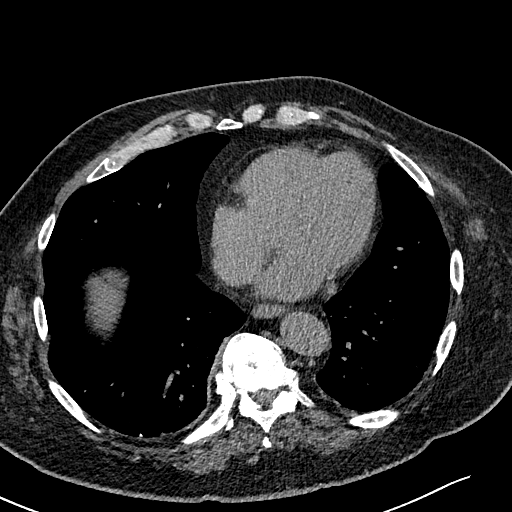
[im 60/158  lung]
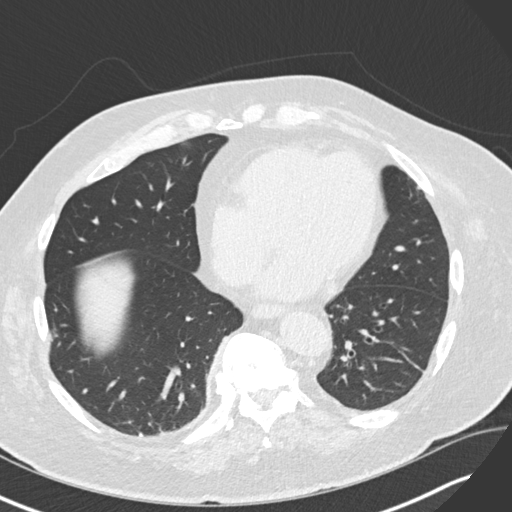
[im 75/158  lung]
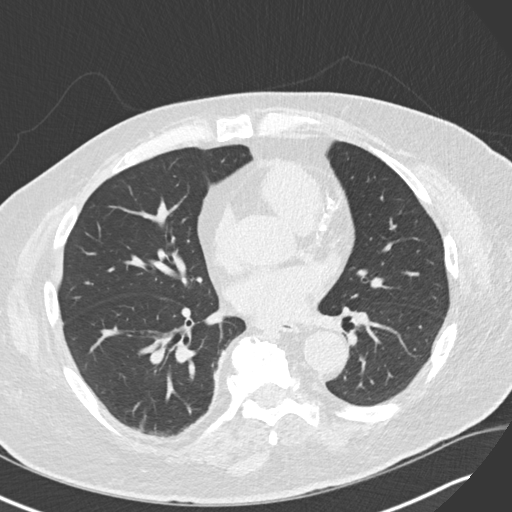
[im 83/158  lung]
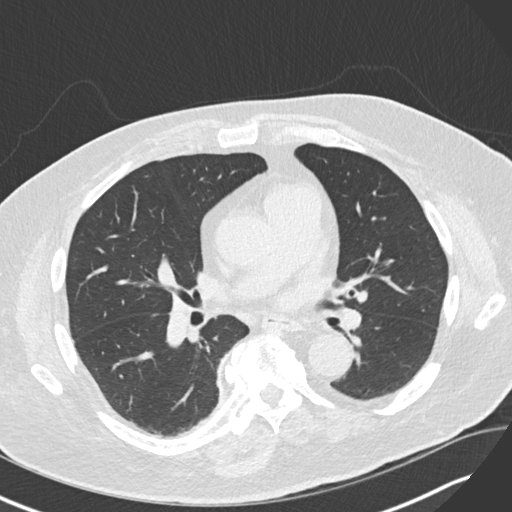
[im 98/158  lung]
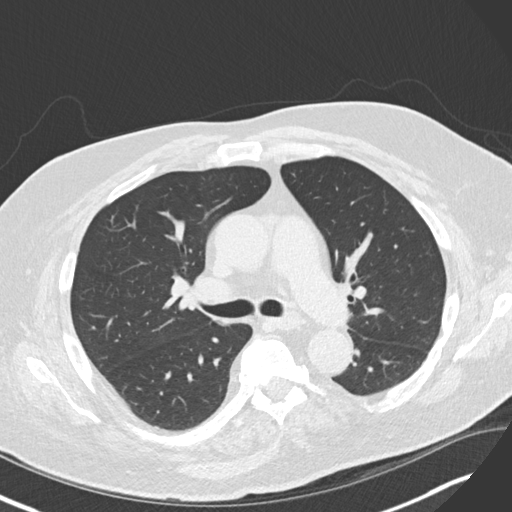
[im 113/158  mediastinal]
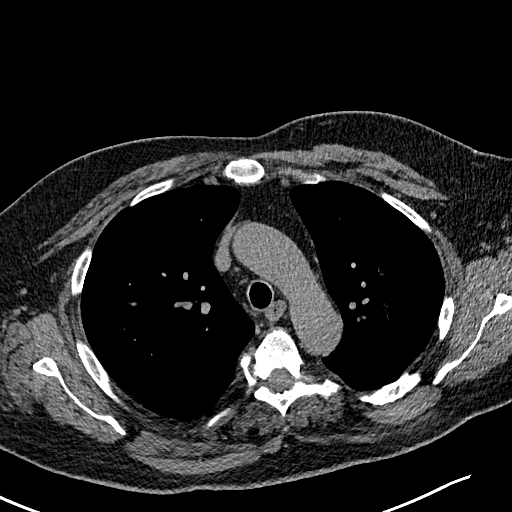
[im 113/158  lung]
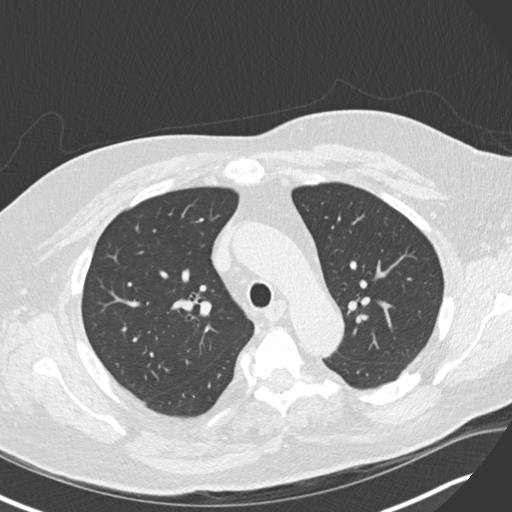
[im 120/158  lung]
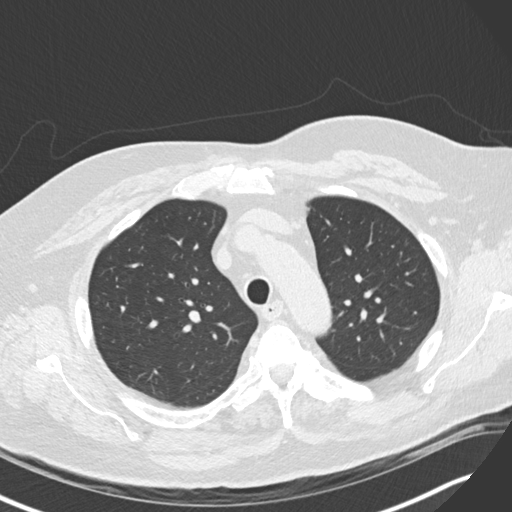
[im 135/158  lung]
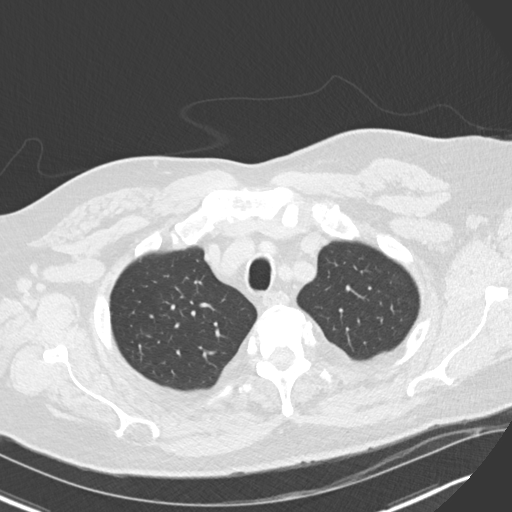
[im 150/158  lung]
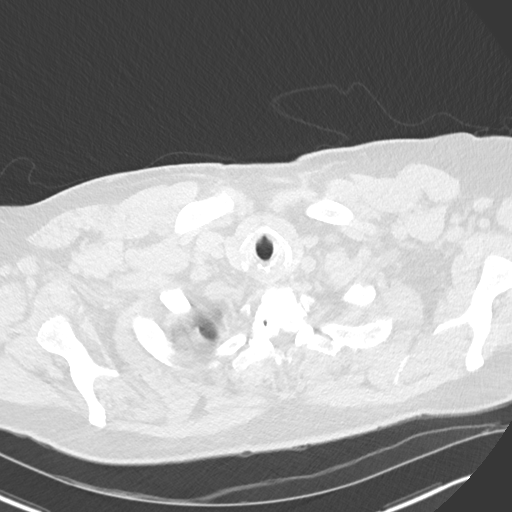

[Series 11: coronal · coronal · 0.65mm/px · 3 of 148 slices shown]
[im 30/148  lung]
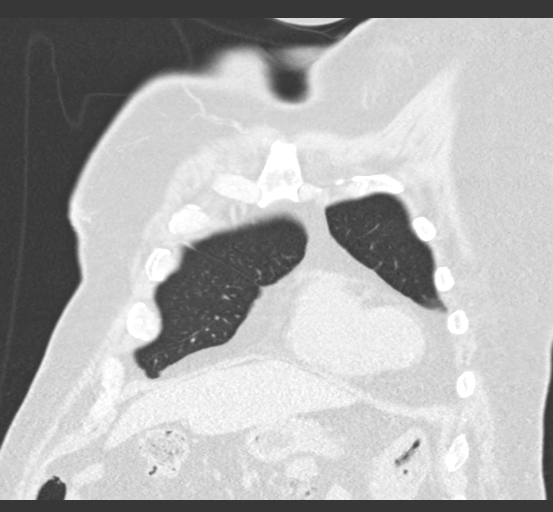
[im 59/148  lung]
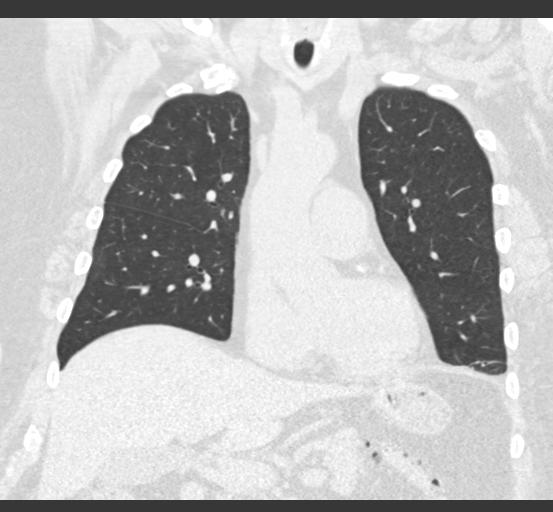
[im 89/148  lung]
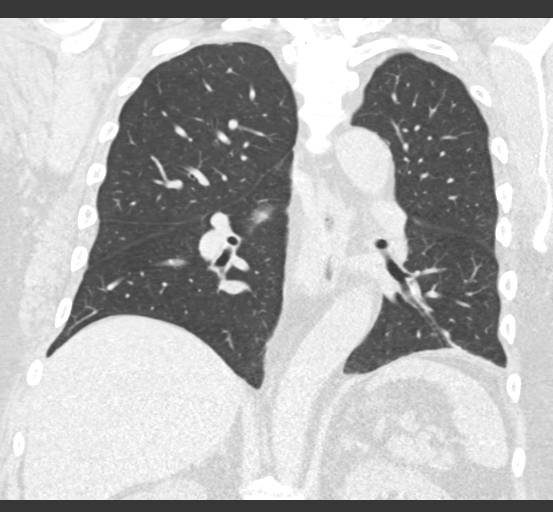

[15 of 36 positions shown; findings below may reference images not displayed]

FINDINGS: Cardiovascular: Heart size is normal. There is no significant
pericardial fluid, thickening or pericardial calcification. There is
aortic atherosclerosis, as well as atherosclerosis of the great
vessels of the mediastinum and the coronary arteries, including
calcified atherosclerotic plaque in the left main, left anterior
descending, left circumflex and right coronary arteries.

Mediastinum/Nodes: No pathologically enlarged mediastinal or hilar
lymph nodes. Please note that accurate exclusion of hilar adenopathy
is limited on noncontrast CT scans. Esophagus is unremarkable in
appearance. No axillary lymphadenopathy.

Lungs/Pleura: There is an area of subpleural nodularity and focal
architectural distortion in the basal segments of the right lower
lobe abutting the pleural surface, which appears similar in
retrospect to the prior study, likely to reflect an area of chronic
post infectious scarring. Mild linear scarring also noted in the
left lower lobe. High-resolution images otherwise demonstrate no
significant regions of ground-glass attenuation, subpleural
reticulation, traction bronchiectasis or frank honeycombing.
Inspiratory and expiratory imaging demonstrates very mild air
trapping indicative of mild small airways disease. No acute
consolidative airspace disease. No pleural effusions. No suspicious
appearing pulmonary nodules or masses.

Upper Abdomen: Unremarkable.

Musculoskeletal: There are no aggressive appearing lytic or blastic
lesions noted in the visualized portions of the skeleton.
IMPRESSION: 1. No findings to suggest interstitial lung disease.
2. Mild post infectious scarring in the lung bases bilaterally
(right greater than left).
3. Mild air trapping indicative of very mild small airways disease.
4. Aortic atherosclerosis, in addition to left main and 3 vessel
coronary artery disease. Assessment for potential risk factor
modification, dietary therapy or pharmacologic therapy may be
warranted, if clinically indicated.

Aortic Atherosclerosis ([id number]-2Z2.2).

## 2019-03-16 ENCOUNTER — Other Ambulatory Visit: Payer: Self-pay | Admitting: Internal Medicine

## 2019-03-16 DIAGNOSIS — I5030 Unspecified diastolic (congestive) heart failure: Secondary | ICD-10-CM

## 2019-03-16 DIAGNOSIS — I10 Essential (primary) hypertension: Secondary | ICD-10-CM

## 2019-03-29 ENCOUNTER — Telehealth: Payer: Self-pay | Admitting: Internal Medicine

## 2019-03-29 NOTE — Telephone Encounter (Signed)
Left message for patient to call back  

## 2019-03-30 MED ORDER — FLUTICASONE-SALMETEROL 250-50 MCG/DOSE IN AEPB: 1.0000 | INHALATION_SPRAY | Freq: Two times a day (BID) | RESPIRATORY_TRACT | 0 refills | Status: DC

## 2019-03-30 NOTE — Telephone Encounter (Signed)
Patient spouse is returning the call. CB is [phone]

## 2019-03-30 NOTE — Telephone Encounter (Signed)
LMTCB x2 for pt. He is overdue for an appointment with Dr. Melvyn Novas.

## 2019-03-30 NOTE — Telephone Encounter (Signed)
Call returned to patient, confirmed DOB, made aware he needs a OV. Appt made. 30 day supply sent it.   Nothing further needed at this time.

## 2019-04-03 ENCOUNTER — Ambulatory Visit (INDEPENDENT_AMBULATORY_CARE_PROVIDER_SITE_OTHER): Payer: Medicare Other | Admitting: Internal Medicine

## 2019-04-03 ENCOUNTER — Encounter: Payer: Self-pay | Admitting: Internal Medicine

## 2019-04-03 ENCOUNTER — Other Ambulatory Visit: Payer: Self-pay

## 2019-04-03 DIAGNOSIS — J449 Chronic obstructive pulmonary disease, unspecified: Secondary | ICD-10-CM | POA: Diagnosis not present

## 2019-04-03 NOTE — Patient Instructions (Signed)
No change in medications   Keep up the wt loss    Please schedule a follow up visit in  6 months but call sooner if needed
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
## 2019-04-04 ENCOUNTER — Encounter: Payer: Self-pay | Admitting: Internal Medicine

## 2019-04-04 NOTE — Assessment & Plan Note (Addendum)
Quit smoking 05/2013 PFT's  12/22/2017  FEV1 2.08  (75 % ) ratio 74  p 22 % improvement from saba p nothing prior to study with DLCO  67/66c % corrects to 103  % for alv volume  With mild curvature  - 12/22/2017  After extensive coaching inhaler device,  effectiveness =    75% so try symbicort 80 samples and rx for symb 160 2bid then return at 6 weeks for repeat walking study and return with all meds > did not return with meds as req - 02/04/2018  After extensive coaching inhaler device,  effectiveness =    75% from a baseline of 25% - 03/09/2018  After extensive coaching inhaler device,  effectiveness =    90% with wixela device  - 03/09/2018 try off coreg > not done due to dosing issues with bisoprolol   Adequate control on present rx, reviewed in detail with pt > ok to just use the am dose of wixella if doing great to save on cost   - The proper method of use, as well as anticipated side effects, of a dry powder inhaler were discussed and demonstrated to the patient.   F/u can be q 6 m

## 2019-04-04 NOTE — Assessment & Plan Note (Signed)
Body mass index is 37.42 kg/m.  -  trending down/ congratulated  Lab Results  Component Value Date   TSH 3.51 06/29/2018     Contributing to   doe/reviewed the need and the process to achieve and maintain neg calorie balance > defer f/u primary care including intermittently monitoring thyroid status    I had an extended discussion with the patient reviewing all relevant studies completed to date and  lasting 15 to 20 minutes of a 25 minute visit    I performed detailed device teaching using a teach back method which extended face to face time for this visit (see above)  Each maintenance medication was reviewed in detail including emphasizing most importantly the difference between maintenance and prns and under what circumstances the prns are to be triggered using an action plan format that is not reflected in the computer generated alphabetically organized AVS which I have not found useful in most complex patients, especially with respiratory illnesses  Please see AVS for specific instructions unique to this visit that I personally wrote and verbalized to the the pt in detail and then reviewed with pt  by my nurse highlighting any  changes in therapy recommended at today's visit to their plan of care.

## 2019-04-10 ENCOUNTER — Other Ambulatory Visit: Payer: Self-pay | Admitting: Internal Medicine

## 2019-04-10 DIAGNOSIS — I10 Essential (primary) hypertension: Secondary | ICD-10-CM

## 2019-04-10 DIAGNOSIS — I5030 Unspecified diastolic (congestive) heart failure: Secondary | ICD-10-CM

## 2019-04-25 ENCOUNTER — Other Ambulatory Visit: Payer: Self-pay | Admitting: Internal Medicine

## 2019-04-27 ENCOUNTER — Other Ambulatory Visit: Payer: Self-pay | Admitting: Internal Medicine

## 2019-05-17 IMAGING — DX DG CHEST 2V
2 series · 2 of 2 positions shown · non-contrast
Comparison: Chest x-ray 11/25/2017 and chest CT 03/24/2018

CLINICAL DATA: Cough, congestion and shortness of breath

EXAM:
CHEST - 2 VIEW

[chest pa]
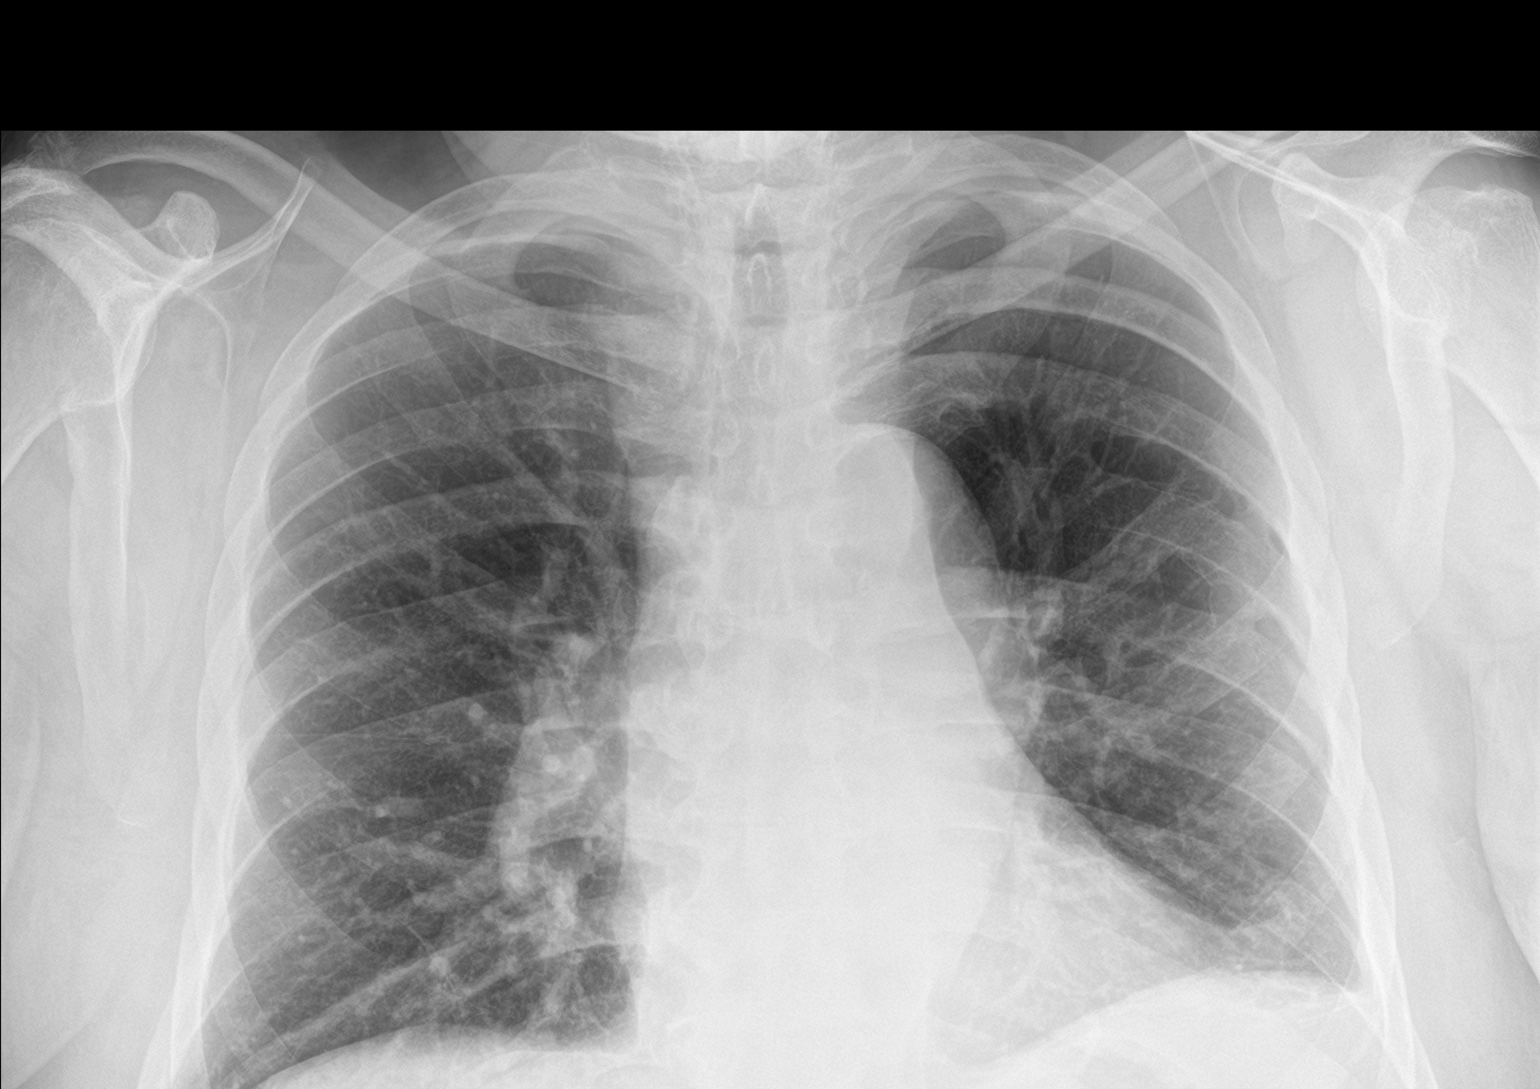

[chest lat]
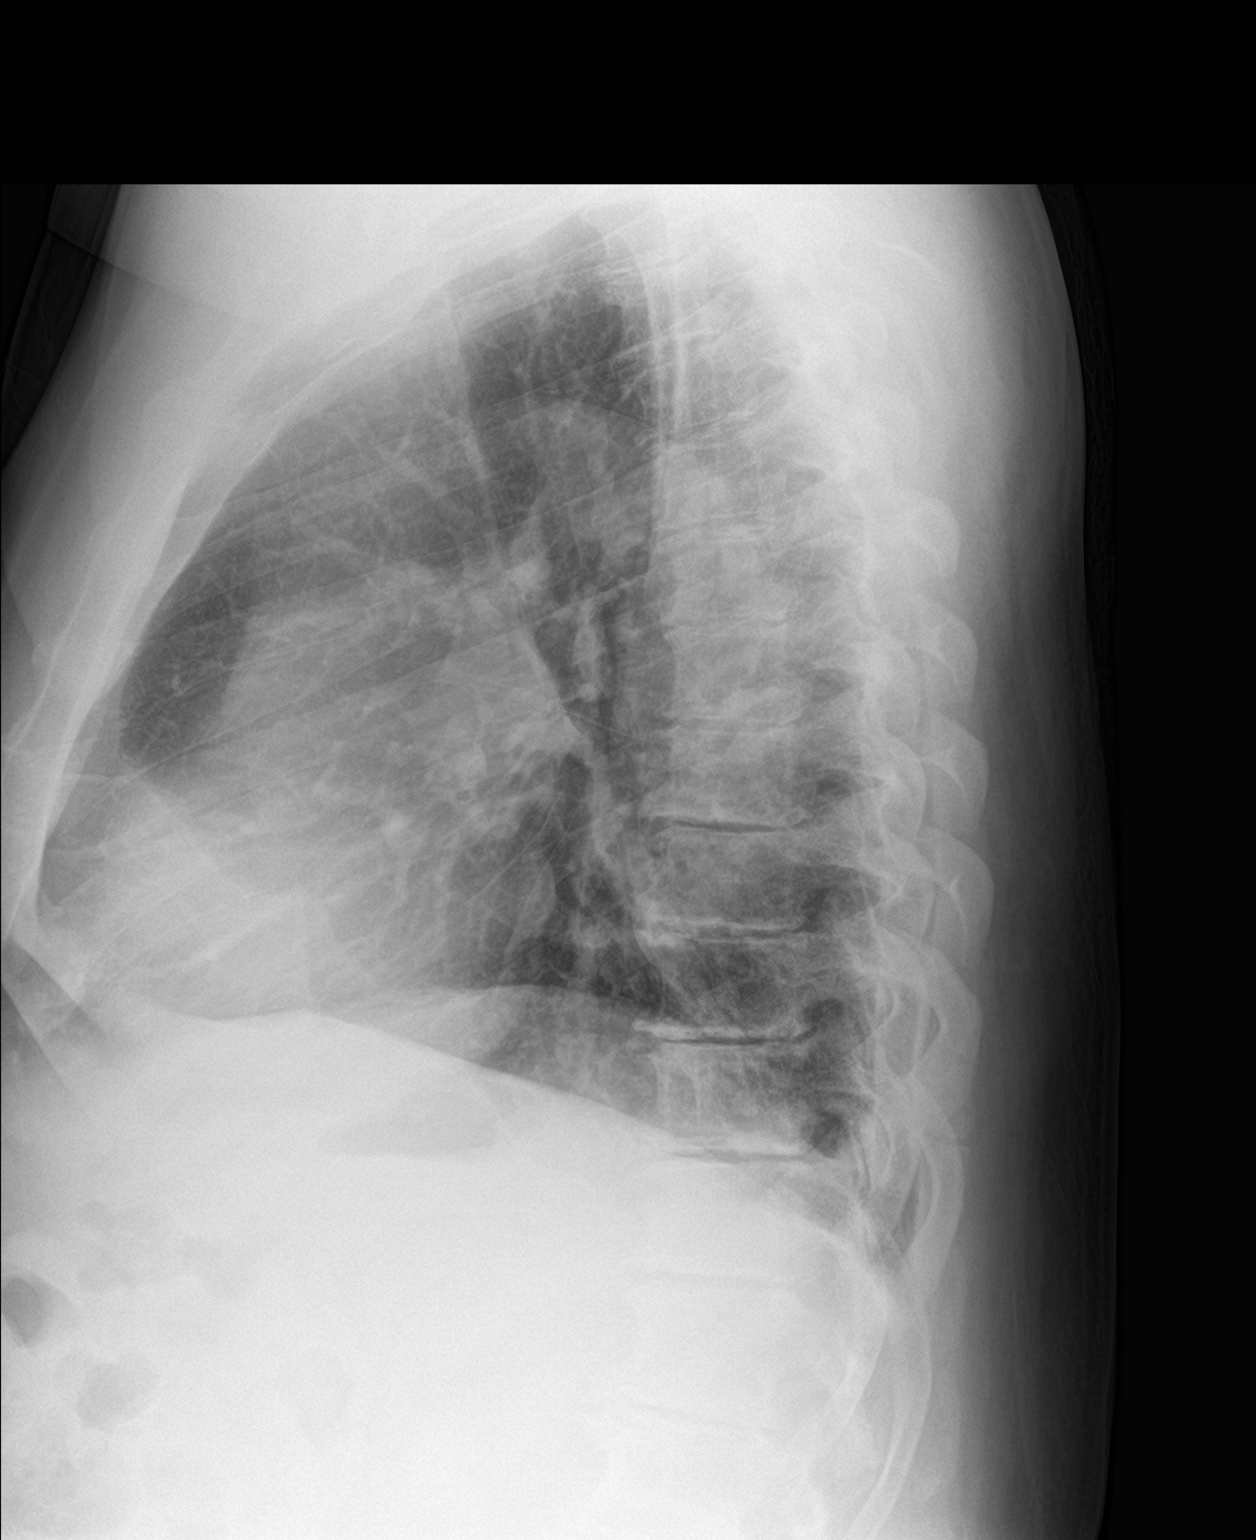

[2 of 2 positions shown; findings below may reference images not displayed]

FINDINGS: The cardiac silhouette, mediastinal and hilar contours are within
normal limits and stable. There is moderate tortuosity of the
thoracic aorta. The right costophrenic angle is cut off the bottom
edge of the film. No definite infiltrates, edema or pulmonary
lesions. Stable left pleural thickening and evidence of remote
left-sided thoracic trauma. The bony thorax is intact. Stable
degenerative changes involving the thoracic spine.
IMPRESSION: No acute cardiopulmonary findings.

Remote posttraumatic changes involving the left hemithorax.

## 2019-06-21 ENCOUNTER — Other Ambulatory Visit: Payer: Self-pay | Admitting: Internal Medicine

## 2019-06-21 DIAGNOSIS — I5030 Unspecified diastolic (congestive) heart failure: Secondary | ICD-10-CM

## 2019-06-21 DIAGNOSIS — I10 Essential (primary) hypertension: Secondary | ICD-10-CM

## 2019-06-21 MED ORDER — SPIRONOLACTONE 25 MG PO TABS: 25.0000 mg | ORAL_TABLET | Freq: Every day | ORAL | 0 refills | Status: DC

## 2019-06-22 DIAGNOSIS — H0011 Chalazion right upper eyelid: Secondary | ICD-10-CM | POA: Diagnosis not present

## 2019-06-22 DIAGNOSIS — H1031 Unspecified acute conjunctivitis, right eye: Secondary | ICD-10-CM | POA: Diagnosis not present

## 2019-06-22 DIAGNOSIS — H0012 Chalazion right lower eyelid: Secondary | ICD-10-CM | POA: Diagnosis not present

## 2019-06-22 DIAGNOSIS — H5711 Ocular pain, right eye: Secondary | ICD-10-CM | POA: Diagnosis not present

## 2019-06-27 ENCOUNTER — Other Ambulatory Visit: Payer: Self-pay | Admitting: Family

## 2019-07-14 ENCOUNTER — Other Ambulatory Visit: Payer: Self-pay | Admitting: Internal Medicine

## 2019-07-14 DIAGNOSIS — I10 Essential (primary) hypertension: Secondary | ICD-10-CM

## 2019-07-14 DIAGNOSIS — I5030 Unspecified diastolic (congestive) heart failure: Secondary | ICD-10-CM

## 2019-07-30 ENCOUNTER — Other Ambulatory Visit: Payer: Self-pay | Admitting: Internal Medicine

## 2019-08-17 ENCOUNTER — Other Ambulatory Visit: Payer: Self-pay | Admitting: Internal Medicine

## 2019-08-29 ENCOUNTER — Other Ambulatory Visit: Payer: Self-pay | Admitting: Internal Medicine

## 2019-08-29 DIAGNOSIS — L240 Irritant contact dermatitis due to detergents: Secondary | ICD-10-CM

## 2019-09-24 ENCOUNTER — Other Ambulatory Visit: Payer: Self-pay | Admitting: Internal Medicine

## 2019-09-24 DIAGNOSIS — I10 Essential (primary) hypertension: Secondary | ICD-10-CM

## 2019-09-24 DIAGNOSIS — I5030 Unspecified diastolic (congestive) heart failure: Secondary | ICD-10-CM

## 2019-10-01 ENCOUNTER — Other Ambulatory Visit: Payer: Self-pay | Admitting: Internal Medicine

## 2019-10-01 DIAGNOSIS — I10 Essential (primary) hypertension: Secondary | ICD-10-CM

## 2019-10-01 DIAGNOSIS — I5030 Unspecified diastolic (congestive) heart failure: Secondary | ICD-10-CM

## 2019-10-06 ENCOUNTER — Other Ambulatory Visit: Payer: Self-pay | Admitting: Internal Medicine

## 2019-10-06 DIAGNOSIS — I5030 Unspecified diastolic (congestive) heart failure: Secondary | ICD-10-CM

## 2019-10-06 DIAGNOSIS — I10 Essential (primary) hypertension: Secondary | ICD-10-CM

## 2019-10-08 ENCOUNTER — Other Ambulatory Visit: Payer: Self-pay | Admitting: Internal Medicine

## 2019-10-08 DIAGNOSIS — I5030 Unspecified diastolic (congestive) heart failure: Secondary | ICD-10-CM

## 2019-10-08 DIAGNOSIS — I10 Essential (primary) hypertension: Secondary | ICD-10-CM

## 2019-10-10 ENCOUNTER — Telehealth: Payer: Self-pay | Admitting: Internal Medicine

## 2019-10-10 DIAGNOSIS — I10 Essential (primary) hypertension: Secondary | ICD-10-CM

## 2019-10-10 DIAGNOSIS — I5030 Unspecified diastolic (congestive) heart failure: Secondary | ICD-10-CM

## 2019-10-13 ENCOUNTER — Other Ambulatory Visit: Payer: Self-pay

## 2019-10-13 DIAGNOSIS — I5030 Unspecified diastolic (congestive) heart failure: Secondary | ICD-10-CM

## 2019-10-13 DIAGNOSIS — I10 Essential (primary) hypertension: Secondary | ICD-10-CM

## 2019-10-13 MED ORDER — OLMESARTAN MEDOXOMIL 40 MG PO TABS: 40.0000 mg | ORAL_TABLET | Freq: Every day | ORAL | 1 refills | Status: DC

## 2019-10-13 NOTE — Telephone Encounter (Signed)
Spoke with pt and was able to verify which medication was needed. I was able to send over the rx refill for Olmesartan 102m tab. Informed pt to come in for a visit with Dr. JRonnald Ramp Pt agreed to come in on 10/17/2019 for further eval for any medications that ma be needed along with blood work as pt has expressed he has not been to see Dr. JRonnald Rampin a long time.

## 2019-10-13 NOTE — Telephone Encounter (Signed)
   Patient is out of medication, checking status of refill

## 2019-10-16 ENCOUNTER — Other Ambulatory Visit: Payer: Self-pay | Admitting: Internal Medicine

## 2019-10-17 ENCOUNTER — Ambulatory Visit (INDEPENDENT_AMBULATORY_CARE_PROVIDER_SITE_OTHER): Payer: Medicare Other | Admitting: Internal Medicine

## 2019-10-17 ENCOUNTER — Encounter: Payer: Self-pay | Admitting: Internal Medicine

## 2019-10-17 ENCOUNTER — Other Ambulatory Visit: Payer: Self-pay

## 2019-10-17 VITALS — BP 152/88 | HR 68 | Temp 98.9°F | Resp 16 | Ht 70.0 in | Wt 262.0 lb

## 2019-10-17 DIAGNOSIS — R739 Hyperglycemia, unspecified: Secondary | ICD-10-CM

## 2019-10-17 DIAGNOSIS — Z0000 Encounter for general adult medical examination without abnormal findings: Secondary | ICD-10-CM | POA: Diagnosis not present

## 2019-10-17 DIAGNOSIS — K7581 Nonalcoholic steatohepatitis (NASH): Secondary | ICD-10-CM

## 2019-10-17 DIAGNOSIS — E559 Vitamin D deficiency, unspecified: Secondary | ICD-10-CM

## 2019-10-17 DIAGNOSIS — E785 Hyperlipidemia, unspecified: Secondary | ICD-10-CM | POA: Insufficient documentation

## 2019-10-17 DIAGNOSIS — N400 Enlarged prostate without lower urinary tract symptoms: Secondary | ICD-10-CM | POA: Diagnosis not present

## 2019-10-17 DIAGNOSIS — I5030 Unspecified diastolic (congestive) heart failure: Secondary | ICD-10-CM | POA: Diagnosis not present

## 2019-10-17 DIAGNOSIS — E039 Hypothyroidism, unspecified: Secondary | ICD-10-CM

## 2019-10-17 DIAGNOSIS — I10 Essential (primary) hypertension: Secondary | ICD-10-CM

## 2019-10-17 LAB — LIPID PANEL
Cholesterol: 131 mg/dL (ref 0–200)
HDL: 34.6 mg/dL — ABNORMAL LOW (ref >39.00)
NonHDL: 96.61
Total CHOL/HDL Ratio: 4
Triglycerides: 212 mg/dL — ABNORMAL HIGH (ref 0.0–149.0)
VLDL: 42.4 mg/dL — ABNORMAL HIGH (ref 0.0–40.0)

## 2019-10-17 LAB — VITAMIN D 25 HYDROXY (VIT D DEFICIENCY, FRACTURES): VITD: 20.81 ng/mL — ABNORMAL LOW (ref 30.00–100.00)

## 2019-10-17 LAB — BASIC METABOLIC PANEL
BUN: 23 mg/dL (ref 6–23)
CO2: 29 mEq/L (ref 19–32)
Calcium: 9.5 mg/dL (ref 8.4–10.5)
Chloride: 103 mEq/L (ref 96–112)
Creatinine, Ser: 1.03 mg/dL (ref 0.40–1.50)
GFR: 85.56 mL/min (ref >60.00)
Glucose, Bld: 103 mg/dL — ABNORMAL HIGH (ref 70–99)
Potassium: 5.1 mEq/L (ref 3.5–5.1)
Sodium: 136 mEq/L (ref 135–145)

## 2019-10-17 LAB — HEMOGLOBIN A1C: Hgb A1c MFr Bld: 6.4 % (ref 4.6–6.5)

## 2019-10-17 LAB — CBC WITH DIFFERENTIAL/PLATELET
Basophils Absolute: 0 10*3/uL (ref 0.0–0.1)
Basophils Relative: 0.8 % (ref 0.0–3.0)
Eosinophils Absolute: 0.3 10*3/uL (ref 0.0–0.7)
Eosinophils Relative: 5 % (ref 0.0–5.0)
HCT: 38 % — ABNORMAL LOW (ref 39.0–52.0)
Hemoglobin: 13.2 g/dL (ref 13.0–17.0)
Lymphocytes Relative: 30.6 % (ref 12.0–46.0)
Lymphs Abs: 1.5 10*3/uL (ref 0.7–4.0)
MCHC: 34.8 g/dL (ref 30.0–36.0)
MCV: 93.7 fl (ref 78.0–100.0)
Monocytes Absolute: 0.5 10*3/uL (ref 0.1–1.0)
Monocytes Relative: 10 % (ref 3.0–12.0)
Neutro Abs: 2.7 10*3/uL (ref 1.4–7.7)
Neutrophils Relative %: 53.6 % (ref 43.0–77.0)
Platelets: 203 10*3/uL (ref 150.0–400.0)
RBC: 4.06 Mil/uL — ABNORMAL LOW (ref 4.22–5.81)
RDW: 15.3 % (ref 11.5–15.5)
WBC: 5 10*3/uL (ref 4.0–10.5)

## 2019-10-17 LAB — HEPATIC FUNCTION PANEL
ALT: 18 U/L (ref 0–53)
AST: 26 U/L (ref 0–37)
Albumin: 4.2 g/dL (ref 3.5–5.2)
Alkaline Phosphatase: 45 U/L (ref 39–117)
Bilirubin, Direct: 0.2 mg/dL (ref 0.0–0.3)
Total Bilirubin: 1 mg/dL (ref 0.2–1.2)
Total Protein: 7 g/dL (ref 6.0–8.3)

## 2019-10-17 LAB — TSH: TSH: 4.63 u[IU]/mL — ABNORMAL HIGH (ref 0.35–4.50)

## 2019-10-17 LAB — LDL CHOLESTEROL, DIRECT: Direct LDL: 65 mg/dL

## 2019-10-17 LAB — BRAIN NATRIURETIC PEPTIDE: Pro B Natriuretic peptide (BNP): 25 pg/mL (ref 0.0–100.0)

## 2019-10-17 LAB — PSA: PSA: 1.24 ng/mL (ref 0.10–4.00)

## 2019-10-17 MED ORDER — CHOLECALCIFEROL 50 MCG (2000 UT) PO TABS: 1.0000 | ORAL_TABLET | Freq: Every day | ORAL | 1 refills | Status: DC

## 2019-10-17 MED ORDER — ATORVASTATIN CALCIUM 80 MG PO TABS: 80.0000 mg | ORAL_TABLET | Freq: Every day | ORAL | 1 refills | Status: DC

## 2019-10-17 MED ORDER — CARVEDILOL 12.5 MG PO TABS: 12.5000 mg | ORAL_TABLET | Freq: Two times a day (BID) | ORAL | 1 refills | Status: DC

## 2019-10-17 MED ORDER — OLMESARTAN MEDOXOMIL 40 MG PO TABS: 40.0000 mg | ORAL_TABLET | Freq: Every day | ORAL | 1 refills | Status: DC

## 2019-10-17 MED ORDER — TORSEMIDE 20 MG PO TABS: 40.0000 mg | ORAL_TABLET | Freq: Every day | ORAL | 0 refills | Status: DC

## 2019-10-17 NOTE — Patient Instructions (Signed)
Heart Failure and Exercise Heart failure is a condition in which the heart does not fill or pump enough blood and oxygen to support your body and its functions. Heart failure is a long-term (chronic) condition. Living with heart failure can be challenging. However, following your health care provider's instructions about a healthy lifestyle may help improve your symptoms. This includes choosing the right exercise plan. Doing daily physical activity is important after a diagnosis of heart failure. You may have some activity restrictions, so talk to your health care provider before doing any exercises. What are the benefits of exercise? Exercise may:  Make your heart muscles stronger.  Lower your blood pressure.  Lower your cholesterol.  Help you lose weight.  Help your bones stay strong.  Improve your blood circulation.  Help your body use oxygen better. This relieves symptoms such as fatigue and shortness of breath.  Help your mental health by lowering the risk of depression and other problems.  Improve your quality of life.  Decrease your chance of hospital admission for heart failure. What is an exercise plan? An exercise plan is a set of specific exercises and training activities. You will work with your health care provider to create the exercise plan that works for you. The plan may include:  Different types of exercises and how to do them.  Cardiac rehabilitation exercises. These are supervised programs that are designed to strengthen your heart. What are strengthening exercises? Strengthening exercises are a type of physical activity that involves using resistance to improve your muscle strength. Strengthening exercises usually have repetitive motions. These types of exercises can include:  Lifting weights.  Using weight machines.  Using resistance tubes and bands.  Using kettlebells.  Using your body weight, such as doing push-ups or squats. What are balance  exercises? Balance exercises are another type of physical activity. They strengthen the muscles of the back, stomach, and pelvis (core muscles) and improve your balance. They can also lower your risk of falling. These types of exercises can include:  Standing on one leg.  Walking backward, sideways, and in a straight line.  Standing up after sitting, without using your hands.  Shifting your weight from one leg to the other.  Lifting one leg in front of you.  Doing tai chi. This is a type of exercise that uses slow movements and deep breathing. How can I increase my flexibility? Having better flexibility can keep you from falling. It can also lengthen your muscles, improve your range of motion, and help your joints. You can increase your flexibility by:  Doing tai chi.  Doing yoga.  Stretching. How much aerobic exercise should I get?  Aerobic exercises strengthen your breathing and circulation system and increase your body's use of oxygen. Examples of aerobic exercise include biking, walking, running, and swimming. Talk to your health care provider to find out how much aerobic exercise is safe for you.  To do these exercises:  Start exercising slowly, limiting the amount of time at first. You may need to start with 5 minutes of aerobic exercise every day.  Slowly add more minutes until you can safely do at least 30 minutes of exercise at least 4 days a week. Summary  Daily physical activity is important after a diagnosis of heart failure.  Exercise can make your heart muscles stronger. It also offers other benefits that will improve your health.  Talk to your health care provider before doing any exercises. This information is not intended to replace advice  given to you by your health care provider. Make sure you discuss any questions you have with your health care provider. Document Revised: 09/11/2016 Document Reviewed: 09/08/2016 Elsevier Patient Education  [place].

## 2019-10-17 NOTE — Progress Notes (Signed)
Subjective:  Patient ID: Kurt Blair, male    DOB: 1945/10/10  Age: 74 y.o. MRN: [id number]  CC: Annual Exam, Hypertension, Hypothyroidism, and Congestive Heart Failure  This visit occurred during the SARS-CoV-2 public health emergency.  Safety protocols were in place, including screening questions prior to the visit, additional usage of staff PPE, and extensive cleaning of exam room while observing appropriate contact time as indicated for disinfecting solutions.    HPI Kurt Blair presents for a CPX.  He complains of intermittent swelling about though both ankles for about a year.  He is active and denies any recent episodes of chest pain, shortness of breath, palpitations, edema, or fatigue.  He was not able to clearly answer the question about whether or not he is compliant with the current listed antihypertensives.  He has a history of hypothyroidism but is not currently taking a thyroid supplement.   Outpatient Medications Prior to Visit  Medication Sig Dispense Refill  . fluticasone (FLONASE) 50 MCG/ACT nasal spray SPRAY 2 SPRAYS INTO EACH NOSTRIL EVERY DAY 48 mL 2  . KLOR-CON M20 20 MEQ tablet TAKE 1 TABLET BY MOUTH TWICE A DAY 180 tablet 1  . triamcinolone cream (KENALOG) 0.5 % Apply 1 application topically 3 (three) times daily. 454 g 1  . WIXELA INHUB 250-50 MCG/DOSE AEPB TAKE 1 PUFF BY MOUTH TWICE A DAY 60 each 3  . atorvastatin (LIPITOR) 80 MG tablet TAKE 1 TABLET BY MOUTH EVERY DAY 90 tablet 1  . carvedilol (COREG) 12.5 MG tablet TAKE 1 TABLET (12.5 MG TOTAL) BY MOUTH 2 (TWO) TIMES DAILY WITH A MEAL. 180 tablet 1  . Cholecalciferol 1.25 MG (50000 UT) capsule Take 1 capsule (50,000 Units total) by mouth once a week. 12 capsule 0  . olmesartan (BENICAR) 40 MG tablet Take 1 tablet (40 mg total) by mouth daily. 90 tablet 1  . spironolactone (ALDACTONE) 25 MG tablet TAKE 1 TABLET BY MOUTH EVERY DAY 90 tablet 0  . torsemide (DEMADEX) 20 MG tablet TAKE 1 TABLET BY MOUTH  EVERY DAY 90 tablet 0  . levocetirizine (XYZAL) 5 MG tablet TAKE 1 TABLET BY MOUTH EVERY DAY IN THE EVENING 90 tablet 1   No facility-administered medications prior to visit.    ROS Review of Systems  Constitutional: Positive for unexpected weight change (wt gain). Negative for appetite change, chills, diaphoresis and fatigue.  HENT: Negative.   Eyes: Negative.   Respiratory: Negative for cough, chest tightness, shortness of breath and wheezing.   Cardiovascular: Positive for leg swelling. Negative for chest pain and palpitations.  Gastrointestinal: Negative for abdominal pain, blood in stool, constipation, diarrhea, nausea and vomiting.  Endocrine: Negative for cold intolerance and heat intolerance.  Genitourinary: Negative.  Negative for difficulty urinating, scrotal swelling and testicular pain.  Musculoskeletal: Negative for arthralgias and myalgias.  Skin: Negative.  Negative for color change, pallor and rash.  Neurological: Negative.  Negative for dizziness, weakness, light-headedness, numbness and headaches.  Hematological: Negative for adenopathy. Does not bruise/bleed easily.  Psychiatric/Behavioral: Negative.     Objective:  BP (!) 152/88 (BP Location: Left Arm, Patient Position: Sitting, Cuff Size: Large)   Pulse 68   Temp 98.9 F (37.2 C) (Oral)   Resp 16   Ht 5' 10"  (1.778 m)   Wt 262 lb (118.8 kg)   SpO2 96%   BMI 37.59 kg/m   BP Readings from Last 3 Encounters:  10/17/19 (!) 152/88  04/03/19 116/78  01/11/19 138/80  Wt Readings from Last 3 Encounters:  10/17/19 262 lb (118.8 kg)  04/03/19 260 lb 12.8 oz (118.3 kg)  01/11/19 265 lb (120.2 kg)    Physical Exam Vitals reviewed.  Constitutional:      General: He is not in acute distress.    Appearance: He is obese. He is not ill-appearing, toxic-appearing or diaphoretic.  HENT:     Nose: Nose normal.     Mouth/Throat:     Mouth: Mucous membranes are moist.  Eyes:     General: No scleral icterus.     Conjunctiva/sclera: Conjunctivae normal.  Cardiovascular:     Rate and Rhythm: Normal rate and regular rhythm.     Heart sounds: Normal heart sounds, S1 normal and S2 normal. No murmur heard.  No gallop.      Comments: EKG -  Sinus rhythm with 1st degree AV block Otherwise normal EKG Pulmonary:     Effort: Pulmonary effort is normal.     Breath sounds: No stridor. No wheezing, rhonchi or rales.  Abdominal:     General: Abdomen is protuberant. Bowel sounds are normal. There is no distension.     Palpations: Abdomen is soft. There is no hepatomegaly, splenomegaly or mass.     Tenderness: There is no abdominal tenderness.  Musculoskeletal:        General: No swelling. Normal range of motion.     Cervical back: Neck supple.     Right lower leg: 1+ Pitting Edema present.     Left lower leg: 1+ Pitting Edema present.  Lymphadenopathy:     Cervical: No cervical adenopathy.  Skin:    General: Skin is warm.     Coloration: Skin is not jaundiced.     Findings: No lesion or rash.  Neurological:     General: No focal deficit present.     Mental Status: He is alert and oriented to person, place, and time. Mental status is at baseline.  Psychiatric:        Mood and Affect: Mood normal.        Behavior: Behavior normal.     Lab Results  Component Value Date   WBC 5.0 10/17/2019   HGB 13.2 10/17/2019   HCT 38.0 (L) 10/17/2019   PLT 203.0 10/17/2019   GLUCOSE 103 (H) 10/17/2019   CHOL 131 10/17/2019   TRIG 212.0 (H) 10/17/2019   HDL 34.60 (L) 10/17/2019   LDLDIRECT 65.0 10/17/2019   LDLCALC 48 09/22/2017   ALT 18 10/17/2019   AST 26 10/17/2019   NA 136 10/17/2019   K 5.1 10/17/2019   CL 103 10/17/2019   CREATININE 1.03 10/17/2019   BUN 23 10/17/2019   CO2 29 10/17/2019   TSH 4.63 (H) 10/17/2019   PSA 1.24 10/17/2019   HGBA1C 6.4 10/17/2019    DG Chest 2 View  Result Date: 06/10/2018 CLINICAL DATA:  Cough, congestion and shortness of breath EXAM: CHEST - 2 VIEW  COMPARISON:  Chest x-ray 11/25/2017 and chest CT 03/24/2018 FINDINGS: The cardiac silhouette, mediastinal and hilar contours are within normal limits and stable. There is moderate tortuosity of the thoracic aorta. The right costophrenic angle is cut off the bottom edge of the film. No definite infiltrates, edema or pulmonary lesions. Stable left pleural thickening and evidence of remote left-sided thoracic trauma. The bony thorax is intact. Stable degenerative changes involving the thoracic spine. IMPRESSION: No acute cardiopulmonary findings. Remote posttraumatic changes involving the left hemithorax. Electronically Signed   By: Ricky Stabs.D.  On: 06/10/2018 15:00    Assessment & Plan:   Herald was seen today for annual exam, hypertension, hypothyroidism and congestive heart failure.  Diagnoses and all orders for this visit:  Hypothyroidism, unspecified type- His TSH is mildly elevated but clinically he appears euthyroid.  At this time I do not recommend a thyroid supplement. -     Lipid panel; Future -     TSH; Future -     TSH -     Lipid panel  Routine general medical examination at a health care facility- Exam completed, labs reviewed, vaccines reviewed, cancer screenings are up-to-date, patient education was given.  NASH (nonalcoholic steatohepatitis)- His liver enzymes are normal now. -     Hepatic function panel; Future -     Hepatic function panel  CHF with left ventricular diastolic dysfunction, NYHA class 1 ([place])- He is mildly symptomatic and his blood pressure is not adequately well controlled.  I recommended that he double the dose of the loop diuretic.  I have also asked him to restart the ARB. -     Basic metabolic panel; Future -     Brain natriuretic peptide; Future -     EKG 12-Lead -     torsemide (DEMADEX) 20 MG tablet; Take 2 tablets (40 mg total) by mouth daily. -     carvedilol (COREG) 12.5 MG tablet; Take 1 tablet (12.5 mg total) by mouth 2 (two) times daily  with a meal. -     olmesartan (BENICAR) 40 MG tablet; Take 1 tablet (40 mg total) by mouth daily. -     Brain natriuretic peptide -     Basic metabolic panel  Essential hypertension- See above -     CBC with Differential/Platelet; Future -     Basic metabolic panel; Future -     torsemide (DEMADEX) 20 MG tablet; Take 2 tablets (40 mg total) by mouth daily. -     carvedilol (COREG) 12.5 MG tablet; Take 1 tablet (12.5 mg total) by mouth 2 (two) times daily with a meal. -     olmesartan (BENICAR) 40 MG tablet; Take 1 tablet (40 mg total) by mouth daily. -     Basic metabolic panel -     CBC with Differential/Platelet  Vitamin D deficiency disease -     VITAMIN D 25 Hydroxy (Vit-D Deficiency, Fractures); Future -     VITAMIN D 25 Hydroxy (Vit-D Deficiency, Fractures) -     Cholecalciferol 50 MCG (2000 UT) TABS; Take 1 tablet (2,000 Units total) by mouth daily.  Hyperglycemia- His A1c is at 6.4%.  Medical therapy is not indicated yet. -     Hemoglobin A1c; Future -     Hemoglobin A1c  Benign prostatic hyperplasia without lower urinary tract symptoms- His PSA is normal which is a reassuring sign that he does not have prostate cancer.  He has no symptoms that need to be treated. -     PSA; Future -     PSA  Dyslipidemia, goal LDL below 100- He has achieved his LDL goal and is doing well on the statin. -     atorvastatin (LIPITOR) 80 MG tablet; Take 1 tablet (80 mg total) by mouth daily.  Other orders -     LDL cholesterol, direct   I have discontinued Rush Farmer Duecker's Cholecalciferol and levocetirizine. I have also changed his atorvastatin. Additionally, I am having him start on torsemide and Cholecalciferol. Lastly, I am having him maintain his triamcinolone  cream, Wixela Inhub, fluticasone, Klor-Con M20, carvedilol, and olmesartan.  Meds ordered this encounter  Medications  . torsemide (DEMADEX) 20 MG tablet    Sig: Take 2 tablets (40 mg total) by mouth daily.    Dispense:  180  tablet    Refill:  0  . atorvastatin (LIPITOR) 80 MG tablet    Sig: Take 1 tablet (80 mg total) by mouth daily.    Dispense:  90 tablet    Refill:  1  . carvedilol (COREG) 12.5 MG tablet    Sig: Take 1 tablet (12.5 mg total) by mouth 2 (two) times daily with a meal.    Dispense:  180 tablet    Refill:  1  . olmesartan (BENICAR) 40 MG tablet    Sig: Take 1 tablet (40 mg total) by mouth daily.    Dispense:  90 tablet    Refill:  1  . Cholecalciferol 50 MCG (2000 UT) TABS    Sig: Take 1 tablet (2,000 Units total) by mouth daily.    Dispense:  90 tablet    Refill:  1   In addition to time spent on CPE, I spent 50 minutes in preparing to see the patient by review of recent labs, imaging and procedures, obtaining and reviewing separately obtained history, communicating with the patient and family or caregiver, ordering medications, tests or procedures, and documenting clinical information in the EHR including the differential Dx, treatment, and any further evaluation and other management of 1. Hypothyroidism, unspecified type 2. NASH (nonalcoholic steatohepatitis) 3. CHF with left ventricular diastolic dysfunction, NYHA class 1 ([place]) 4. Essential hypertension 5. Vitamin D deficiency disease 6. Hyperglycemia 7. Benign prostatic hyperplasia without lower urinary tract symptoms 8. Dyslipidemia, goal LDL below 100    Follow-up: Return in about 3 months (around 01/17/2020).  Scarlette Calico, MD

## 2019-10-18 ENCOUNTER — Telehealth: Payer: Self-pay | Admitting: Internal Medicine

## 2019-10-18 ENCOUNTER — Encounter: Payer: Self-pay | Admitting: Internal Medicine

## 2019-10-18 NOTE — Telephone Encounter (Signed)
Spoke with pt, he states the Rx for Grant Ruts is too expensive at 187.00. He is wanting another sent Rx to be sent in. I looked online at [facility] drug formulary and everything is a tier 3 and up. Pharmacy can you make sure this is correct and run a claim on this pt?

## 2019-10-19 NOTE — Telephone Encounter (Signed)
Pt made aware

## 2019-10-19 NOTE — Telephone Encounter (Signed)
Ran test claim for 1 month of Wixela. Received copay of $142.00. Patient has a $95.00 pharmacy deductible applying to all claims. Copay will be $47.00 once deductible is met.

## 2019-10-19 NOTE — Telephone Encounter (Signed)
Thank you :)

## 2019-10-20 NOTE — Telephone Encounter (Signed)
    Please call patient to discuss lab results

## 2019-10-23 NOTE — Telephone Encounter (Signed)
Pt is scheduled to come back for labs next week.   Pt informed of results and stated understanding.

## 2019-10-31 ENCOUNTER — Other Ambulatory Visit: Payer: Self-pay | Admitting: Internal Medicine

## 2019-10-31 DIAGNOSIS — D539 Nutritional anemia, unspecified: Secondary | ICD-10-CM | POA: Insufficient documentation

## 2019-10-31 NOTE — Telephone Encounter (Signed)
Lab orders needed.   Pt will go to [facility] lab tomorrow to have labs completed to check for vitamin deficiencies.

## 2019-11-01 ENCOUNTER — Other Ambulatory Visit (INDEPENDENT_AMBULATORY_CARE_PROVIDER_SITE_OTHER): Payer: Medicare Other

## 2019-11-01 ENCOUNTER — Other Ambulatory Visit: Payer: Medicare Other

## 2019-11-01 ENCOUNTER — Other Ambulatory Visit: Payer: Self-pay | Admitting: Internal Medicine

## 2019-11-01 DIAGNOSIS — D539 Nutritional anemia, unspecified: Secondary | ICD-10-CM | POA: Diagnosis not present

## 2019-11-01 DIAGNOSIS — D520 Dietary folate deficiency anemia: Secondary | ICD-10-CM

## 2019-11-01 LAB — CBC WITH DIFFERENTIAL/PLATELET
Basophils Absolute: 0.1 10*3/uL (ref 0.0–0.1)
Basophils Relative: 0.8 % (ref 0.0–3.0)
Eosinophils Absolute: 0.2 10*3/uL (ref 0.0–0.7)
Eosinophils Relative: 3.9 % (ref 0.0–5.0)
HCT: 37.7 % — ABNORMAL LOW (ref 39.0–52.0)
Hemoglobin: 13.4 g/dL (ref 13.0–17.0)
Lymphocytes Relative: 30.4 % (ref 12.0–46.0)
Lymphs Abs: 1.9 10*3/uL (ref 0.7–4.0)
MCHC: 35.5 g/dL (ref 30.0–36.0)
MCV: 92.4 fl (ref 78.0–100.0)
Monocytes Absolute: 0.6 10*3/uL (ref 0.1–1.0)
Monocytes Relative: 8.9 % (ref 3.0–12.0)
Neutro Abs: 3.5 10*3/uL (ref 1.4–7.7)
Neutrophils Relative %: 56 % (ref 43.0–77.0)
Platelets: 193 10*3/uL (ref 150.0–400.0)
RBC: 4.08 Mil/uL — ABNORMAL LOW (ref 4.22–5.81)
RDW: 15.1 % (ref 11.5–15.5)
WBC: 6.3 10*3/uL (ref 4.0–10.5)

## 2019-11-01 LAB — IBC PANEL
Iron: 116 ug/dL (ref 42–165)
Saturation Ratios: 31.9 % (ref 20.0–50.0)
Transferrin: 260 mg/dL (ref 212.0–360.0)

## 2019-11-01 LAB — FOLATE: Folate: 3.1 ng/mL — ABNORMAL LOW (ref >5.9)

## 2019-11-01 LAB — FERRITIN: Ferritin: 490.6 ng/mL — ABNORMAL HIGH (ref 22.0–322.0)

## 2019-11-01 LAB — VITAMIN B12: Vitamin B-12: 190 pg/mL — ABNORMAL LOW (ref 211–911)

## 2019-11-01 MED ORDER — FOLIC ACID 1 MG PO TABS: 1.0000 mg | ORAL_TABLET | Freq: Every day | ORAL | 1 refills | Status: DC

## 2019-11-01 NOTE — Progress Notes (Signed)
Pls let him know that his folate level is low - I have sent a supplement to his pharmacy to treat this  Also, his Vit B12 level is low. Ask him to come in soon for a B12 injection.  Kurt Blair

## 2019-11-02 ENCOUNTER — Telehealth: Payer: Self-pay | Admitting: Internal Medicine

## 2019-11-02 NOTE — Telephone Encounter (Signed)
New message:    Pt's wife and states the pt was returning a call to speak with the MA about a vitamin D defiencey. Please advise.

## 2019-11-02 NOTE — Telephone Encounter (Signed)
Spoke to pt and informed of results.

## 2019-11-06 ENCOUNTER — Other Ambulatory Visit: Payer: Self-pay

## 2019-11-06 ENCOUNTER — Ambulatory Visit (INDEPENDENT_AMBULATORY_CARE_PROVIDER_SITE_OTHER): Payer: Medicare Other | Admitting: *Deleted

## 2019-11-06 DIAGNOSIS — E538 Deficiency of other specified B group vitamins: Secondary | ICD-10-CM

## 2019-11-06 LAB — VITAMIN B1: Vitamin B1 (Thiamine): 12 nmol/L (ref 8–30)

## 2019-11-06 LAB — RETICULOCYTES
ABS Retic: 51720 cells/uL (ref 25000–9000)
Retic Ct Pct: 1.2 %

## 2019-11-06 MED ORDER — CYANOCOBALAMIN 1000 MCG/ML IJ SOLN
1000.0000 ug | Freq: Once | INTRAMUSCULAR | Status: AC
  Administered 2019-11-06: 1000 ug via INTRAMUSCULAR

## 2019-11-06 NOTE — Progress Notes (Signed)
Pls cosign for B12 inj../lmb  

## 2019-11-15 ENCOUNTER — Ambulatory Visit (INDEPENDENT_AMBULATORY_CARE_PROVIDER_SITE_OTHER): Payer: Medicare Other | Admitting: *Deleted

## 2019-11-15 ENCOUNTER — Other Ambulatory Visit: Payer: Self-pay

## 2019-11-15 DIAGNOSIS — E538 Deficiency of other specified B group vitamins: Secondary | ICD-10-CM | POA: Diagnosis not present

## 2019-11-15 MED ORDER — CYANOCOBALAMIN 1000 MCG/ML IJ SOLN
1000.0000 ug | Freq: Once | INTRAMUSCULAR | Status: AC
  Administered 2019-11-15: 1000 ug via INTRAMUSCULAR

## 2019-11-15 NOTE — Progress Notes (Signed)
Pls cosign for B12 inj../lmb  

## 2019-11-22 ENCOUNTER — Ambulatory Visit (INDEPENDENT_AMBULATORY_CARE_PROVIDER_SITE_OTHER): Payer: Medicare Other | Admitting: *Deleted

## 2019-11-22 ENCOUNTER — Other Ambulatory Visit: Payer: Self-pay

## 2019-11-22 DIAGNOSIS — E538 Deficiency of other specified B group vitamins: Secondary | ICD-10-CM

## 2019-11-22 MED ORDER — CYANOCOBALAMIN 1000 MCG/ML IJ SOLN
1000.0000 ug | Freq: Once | INTRAMUSCULAR | Status: AC
  Administered 2019-11-22: 1000 ug via INTRAMUSCULAR

## 2019-11-22 NOTE — Progress Notes (Signed)
Pls cosign for B12 inj../lmb  

## 2019-12-18 ENCOUNTER — Other Ambulatory Visit: Payer: Self-pay | Admitting: Internal Medicine

## 2019-12-18 DIAGNOSIS — I10 Essential (primary) hypertension: Secondary | ICD-10-CM

## 2019-12-18 DIAGNOSIS — I5030 Unspecified diastolic (congestive) heart failure: Secondary | ICD-10-CM

## 2019-12-22 ENCOUNTER — Other Ambulatory Visit: Payer: Self-pay | Admitting: Internal Medicine

## 2019-12-22 DIAGNOSIS — I5030 Unspecified diastolic (congestive) heart failure: Secondary | ICD-10-CM

## 2019-12-22 DIAGNOSIS — I10 Essential (primary) hypertension: Secondary | ICD-10-CM

## 2019-12-22 NOTE — Telephone Encounter (Signed)
New message:   Pt is calling and states he is needing a PA for Stironolactone 25 mg per his pharmacy. Please advise.

## 2019-12-25 ENCOUNTER — Other Ambulatory Visit: Payer: Self-pay

## 2019-12-25 ENCOUNTER — Ambulatory Visit (INDEPENDENT_AMBULATORY_CARE_PROVIDER_SITE_OTHER): Payer: Medicare Other | Admitting: *Deleted

## 2019-12-25 DIAGNOSIS — E538 Deficiency of other specified B group vitamins: Secondary | ICD-10-CM | POA: Diagnosis not present

## 2019-12-25 MED ORDER — CYANOCOBALAMIN 1000 MCG/ML IJ SOLN
1000.0000 ug | Freq: Once | INTRAMUSCULAR | Status: AC
  Administered 2019-12-25: 1000 ug via INTRAMUSCULAR

## 2019-12-25 NOTE — Progress Notes (Signed)
Pls cosign for B12 inj../lmb  

## 2019-12-28 NOTE — Telephone Encounter (Signed)
Refill was refused because he is due for an appointment.   Reviewed medication list and pt is not on this medication.   Called pt and informed of same. Pt has been scheduled for his 3 month follow up with PCP.

## 2020-01-01 ENCOUNTER — Other Ambulatory Visit: Payer: Self-pay | Admitting: Internal Medicine

## 2020-01-01 ENCOUNTER — Telehealth: Payer: Self-pay | Admitting: Internal Medicine

## 2020-01-01 DIAGNOSIS — I5030 Unspecified diastolic (congestive) heart failure: Secondary | ICD-10-CM

## 2020-01-01 DIAGNOSIS — I10 Essential (primary) hypertension: Secondary | ICD-10-CM

## 2020-01-01 NOTE — Telephone Encounter (Signed)
Please call this patient regarding a medication change, he doesn't remember what you discussed.  Would like for you to call him

## 2020-01-02 ENCOUNTER — Other Ambulatory Visit: Payer: Self-pay | Admitting: Internal Medicine

## 2020-01-02 DIAGNOSIS — I10 Essential (primary) hypertension: Secondary | ICD-10-CM

## 2020-01-02 DIAGNOSIS — I5030 Unspecified diastolic (congestive) heart failure: Secondary | ICD-10-CM

## 2020-01-02 MED ORDER — TORSEMIDE 20 MG PO TABS: 40.0000 mg | ORAL_TABLET | Freq: Every day | ORAL | 0 refills | Status: DC

## 2020-01-02 NOTE — Telephone Encounter (Signed)
New Message:   Pt's wife is calling again to get clarification of what medication the pt is supposed to be on. They would like a call back asap. Please advise.

## 2020-01-02 NOTE — Telephone Encounter (Signed)
Pt spouse contacted and informed that the spironolactone was discontinued due the K+ level being too elevated. Diuretic was changed to the torsemide. Mardene Celeste (pt spouse) stated understanding and will pick up the torsemide from the pharmacy.

## 2020-01-02 NOTE — Telephone Encounter (Signed)
There is a lot of confusion on the diuretic that pt is suppose to take. Spironolactone is not on his medication list. Torsemide is on the med list.   Pt states that the pharmacy does not have torsemide on his profile.   Can you confirm the diuretic that pt should have?   I scheduled pt for an appointment when the last refill request for the spironolactone was sent in.

## 2020-01-17 ENCOUNTER — Other Ambulatory Visit: Payer: Self-pay

## 2020-01-17 ENCOUNTER — Ambulatory Visit (INDEPENDENT_AMBULATORY_CARE_PROVIDER_SITE_OTHER): Payer: Medicare Other | Admitting: Internal Medicine

## 2020-01-17 ENCOUNTER — Encounter: Payer: Self-pay | Admitting: Internal Medicine

## 2020-01-17 ENCOUNTER — Ambulatory Visit: Payer: Medicare Other

## 2020-01-17 VITALS — BP 114/68 | HR 62 | Temp 98.2°F | Resp 16 | Ht 70.0 in | Wt 255.0 lb

## 2020-01-17 DIAGNOSIS — I5030 Unspecified diastolic (congestive) heart failure: Secondary | ICD-10-CM | POA: Diagnosis not present

## 2020-01-17 DIAGNOSIS — D520 Dietary folate deficiency anemia: Secondary | ICD-10-CM | POA: Diagnosis not present

## 2020-01-17 DIAGNOSIS — Z23 Encounter for immunization: Secondary | ICD-10-CM | POA: Diagnosis not present

## 2020-01-17 DIAGNOSIS — I10 Essential (primary) hypertension: Secondary | ICD-10-CM | POA: Diagnosis not present

## 2020-01-17 DIAGNOSIS — E039 Hypothyroidism, unspecified: Secondary | ICD-10-CM

## 2020-01-17 DIAGNOSIS — D539 Nutritional anemia, unspecified: Secondary | ICD-10-CM

## 2020-01-17 MED ORDER — TORSEMIDE 20 MG PO TABS: 20.0000 mg | ORAL_TABLET | Freq: Every day | ORAL | 1 refills | Status: DC

## 2020-01-17 NOTE — Patient Instructions (Signed)
Heart Failure Medicines  Heart failure is a condition in which the heart cannot pump enough blood through the body. This can cause symptoms such as shortness of breath, fatigue, and confusion. There are two types of heart failure:  Heart failure with reduced ejection fraction. In this type, the heart muscle is weak.  Heart failure with preserved ejection fraction. In this type, the heart muscle does not fill with blood properly and may be stiff. There is no cure for heart failure. However, being treated with medicines and following your health care provider's instructions about a healthy lifestyle can help you stay active, avoid problems, and live longer. Talk to your health care provider about all medicines that you are taking, how often you should take them, and what possible problems (side effects) they may cause. Talk with your health care provider if you have difficulty affording your medicines. What are some common medicines for heart failure? The medicines that are prescribed for you will depend on your symptoms, the type of heart failure you have, and the cause of your heart failure. In some cases, you may need to take more than one medicine. You will be prescribed the following medicines according to your type of heart failure: Heart failure with reduced ejection fraction  Beta-blockers.  Angiotensin-converting enzyme (ACE) inhibitors.  Angiotensin II receptor blockers (ARBs).  Angiotensin receptor neprilysin inhibitors (ARNIs).  Aldosterone antagonists.  Diuretics.  Digoxin.  Nitrates. Heart failure with preserved ejection fraction  Medicines to control blood pressure, including: ? Beta-blockers. ? Angiotensin-converting enzyme (ACE) inhibitors. ? Angiotensin II receptor blockers (ARBs).  Diuretics.  Aldosterone antagonists. What should I know about beta-blockers?  These medicines lower your blood pressure and slow your heart rate. This helps to lessen your heart's  workload.  They can help to relieve chest pain (angina).  They can help to improve your heart's ability to pump.  They may cause asthma attacks and shortness of breath.  Because these medicines slow your heart rate, it is important not to overwork yourself while exercising. Talk to your health care provider about what your target heart rate should be while you exercise.  These medicines can hide the symptoms of low blood sugar (glucose), which is also called hypoglycemia. If you have diabetes, make sure to check your blood glucose carefully. If you have hypoglycemia, talk to your health care provider about adjusting your medicines.  Beta-blockers may make you feel dizzy or light-headed at first. Do not drive or use heavy machinery when you first start these medicines. Ask your health care provider when it is safe for you to drive. What should I know about ACE inhibitors or ARBs?  These medicines help to widen arteries and veins. This action lowers your blood pressure and lessens the strain on your heart, making it easier for your heart to pump.  They can help to lessen the symptoms of heart failure.  ARBs are often used if a person cannot take ACE inhibitors.  ACE inhibitors may cause a dry cough.  In rare cases, ACE inhibitors and ARBs can cause swelling of the tongue or lips, other swelling, taste problems, and skin rashes. If these symptoms occur, stop taking the medicines and contact your health care provider.  Do not take ACE inhibitors if you are pregnant or may become pregnant. These medicines can cause health problems in an unborn baby.  These medicines may cause dizziness. You may need regular checkups and blood tests to monitor how they are working. What should I  know about ARNIs?  These medicines are a combination of an ARB and another medicine. They lower your blood pressure.  Side effects may include dry cough, dizziness, low blood pressure, and kidney problems.  Do not  take ARNIs if you are already taking ACE inhibitors or ARBs.  You may notice increased urination when taking these medicines.  ARNIs can raise the amount of potassium in the blood. Your potassium levels will be monitored regularly by your health care provider. What should I know about aldosterone antagonists?  They help the body to remove excess sodium through urination. This helps to lessen the amount of blood that the heart needs to pump.  They can also help to lower blood pressure and improve the heart's ability to pump blood.  They may cause dizziness, diarrhea, coughing, or flu-like symptoms.  They should not be used if you have type 2 diabetes.  They can raise the amount of potassium in the blood. Your potassium levels will be monitored regularly by your health care provider.  These medicines can make men's breasts large and tender. What should I know about diuretics?  Diuretics are medicines that help the body get rid of excess fluid through urination. They can also help lessen your heart's workload.  They help to lessen fluid buildup in the lungs, ankles, and feet.  They help to lower your blood pressure.  They can worsen problems with controlling urination (urinary incontinence).  They may cause dizziness, headaches, muscle cramps, and an upset stomach.  They can cause weak muscles, dry mouth, or confusion. It is important to drink plenty of fluids while taking these medicines, especially while exercising or on hot days. What should I know about digoxin?  Digoxin helps the heart pump more blood efficiently. It also lowers your heart rate.  It can help ease heart failure symptoms and may be used if other medicines do not work.  It can also help with irregular heartbeat (arrhythmia).  It may cause stomach problems, fatigue, headache, drowsiness, or vision problems. What should I know about nitrates?  Nitrates relax the blood vessels and increase oxygen and blood  supply to the heart. They also lower the blood pressure.  They are usually taken to lessen chest pain.  They may cause headaches, flushing, or irregular heartbeat. Summary  A healthy lifestyle and treatment with medicine will relieve symptoms of heart failure.  In some cases, you may need to take more than one medicine.  It is important to talk to your health care provider about how often you should take your medicines. Do not skip a dose or change your dosage.  Talk to your health care provider about possible side effects of these medicines. This information is not intended to replace advice given to you by your health care provider. Make sure you discuss any questions you have with your health care provider. Document Revised: 05/12/2017 Document Reviewed: 09/11/2016 Elsevier Patient Education  [place].

## 2020-01-17 NOTE — Progress Notes (Signed)
Subjective:  Patient ID: Kurt Blair, male    DOB: 1945-11-16  Age: 74 y.o. MRN: [id number]  CC: Anemia, Hypertension, and Congestive Heart Failure  This visit occurred during the SARS-CoV-2 public health emergency.  Safety protocols were in place, including screening questions prior to the visit, additional usage of staff PPE, and extensive cleaning of exam room while observing appropriate contact time as indicated for disinfecting solutions.    HPI Kurt Blair presents for f/up - He has had a decrease in his lower extremity edema.  He is active and denies any recent episodes of chest pain, shortness of breath, palpitations, edema, or fatigue.  He is not gaining weight.  Outpatient Medications Prior to Visit  Medication Sig Dispense Refill  . atorvastatin (LIPITOR) 80 MG tablet Take 1 tablet (80 mg total) by mouth daily. 90 tablet 1  . carvedilol (COREG) 12.5 MG tablet Take 1 tablet (12.5 mg total) by mouth 2 (two) times daily with a meal. 180 tablet 1  . Cholecalciferol 50 MCG (2000 UT) TABS Take 1 tablet (2,000 Units total) by mouth daily. 90 tablet 1  . fluticasone (FLONASE) 50 MCG/ACT nasal spray SPRAY 2 SPRAYS INTO EACH NOSTRIL EVERY DAY 48 mL 2  . folic acid (FOLVITE) 1 MG tablet Take 1 tablet (1 mg total) by mouth daily. 90 tablet 1  . KLOR-CON M20 20 MEQ tablet TAKE 1 TABLET BY MOUTH TWICE A DAY 180 tablet 1  . olmesartan (BENICAR) 40 MG tablet Take 1 tablet (40 mg total) by mouth daily. 90 tablet 1  . triamcinolone cream (KENALOG) 0.5 % Apply 1 application topically 3 (three) times daily. 454 g 1  . WIXELA INHUB 250-50 MCG/DOSE AEPB TAKE 1 PUFF BY MOUTH TWICE A DAY 60 each 3  . torsemide (DEMADEX) 20 MG tablet TAKE 2 TABLETS BY MOUTH EVERY DAY 180 tablet 0   No facility-administered medications prior to visit.    ROS Review of Systems  Constitutional: Negative.  Negative for appetite change, diaphoresis, fatigue and unexpected weight change.  HENT: Negative.   Eyes:  Negative for visual disturbance.  Respiratory: Negative for cough, chest tightness, shortness of breath and wheezing.   Cardiovascular: Negative for chest pain, palpitations and leg swelling.  Gastrointestinal: Negative for abdominal pain, constipation, diarrhea, nausea and vomiting.  Endocrine: Negative.  Negative for polyuria.  Genitourinary: Negative.  Negative for difficulty urinating.  Musculoskeletal: Negative.  Negative for arthralgias and myalgias.  Skin: Negative.  Negative for color change.  Neurological: Negative for dizziness, weakness and light-headedness.  Hematological: Negative for adenopathy. Does not bruise/bleed easily.  Psychiatric/Behavioral: Negative.     Objective:  BP 114/68   Pulse 62   Temp 98.2 F (36.8 C) (Oral)   Resp 16   Ht 5' 10"  (1.778 m)   Wt 255 lb (115.7 kg)   SpO2 95%   BMI 36.59 kg/m   BP Readings from Last 3 Encounters:  01/17/20 114/68  10/17/19 (!) 152/88  04/03/19 116/78    Wt Readings from Last 3 Encounters:  01/17/20 255 lb (115.7 kg)  10/17/19 262 lb (118.8 kg)  04/03/19 260 lb 12.8 oz (118.3 kg)    Physical Exam Vitals reviewed.  Constitutional:      Appearance: Normal appearance.  HENT:     Nose: Nose normal.     Mouth/Throat:     Mouth: Mucous membranes are moist.  Eyes:     General: No scleral icterus.    Conjunctiva/sclera: Conjunctivae normal.  Cardiovascular:  Rate and Rhythm: Normal rate and regular rhythm.     Heart sounds: Murmur heard.  Systolic murmur is present with a grade of 1/6.  No diastolic murmur is present.  No gallop.   Pulmonary:     Effort: Pulmonary effort is normal.     Breath sounds: No wheezing, rhonchi or rales.  Abdominal:     General: Abdomen is protuberant. Bowel sounds are normal. There is no distension.     Palpations: Abdomen is soft. There is no hepatomegaly, splenomegaly or mass.     Tenderness: There is no abdominal tenderness.  Musculoskeletal:        General: Normal  range of motion.     Cervical back: Neck supple.     Right lower leg: 1+ Pitting Edema (trace) present.     Left lower leg: 1+ Pitting Edema (trace) present.  Lymphadenopathy:     Cervical: No cervical adenopathy.  Skin:    General: Skin is warm and dry.     Coloration: Skin is not pale.  Neurological:     General: No focal deficit present.     Mental Status: He is alert.  Psychiatric:        Mood and Affect: Mood normal.        Behavior: Behavior normal.     Lab Results  Component Value Date   WBC 4.5 01/17/2020   HGB 11.6 (L) 01/17/2020   HCT 34.3 (L) 01/17/2020   PLT 191 01/17/2020   GLUCOSE 96 01/17/2020   CHOL 131 10/17/2019   TRIG 212.0 (H) 10/17/2019   HDL 34.60 (L) 10/17/2019   LDLDIRECT 65.0 10/17/2019   LDLCALC 48 09/22/2017   ALT 18 10/17/2019   AST 26 10/17/2019   NA 139 01/17/2020   K 4.4 01/17/2020   CL 103 01/17/2020   CREATININE 1.37 (H) 01/17/2020   BUN 26 (H) 01/17/2020   CO2 28 01/17/2020   TSH 4.29 01/17/2020   PSA 1.24 10/17/2019   HGBA1C 6.4 10/17/2019    DG Chest 2 View  Result Date: 06/10/2018 CLINICAL DATA:  Cough, congestion and shortness of breath EXAM: CHEST - 2 VIEW COMPARISON:  Chest x-ray 11/25/2017 and chest CT 03/24/2018 FINDINGS: The cardiac silhouette, mediastinal and hilar contours are within normal limits and stable. There is moderate tortuosity of the thoracic aorta. The right costophrenic angle is cut off the bottom edge of the film. No definite infiltrates, edema or pulmonary lesions. Stable left pleural thickening and evidence of remote left-sided thoracic trauma. The bony thorax is intact. Stable degenerative changes involving the thoracic spine. IMPRESSION: No acute cardiopulmonary findings. Remote posttraumatic changes involving the left hemithorax. Electronically Signed   By: Marijo Sanes M.D.   On: 06/10/2018 15:00    Assessment & Plan:   Kurt Blair was seen today for anemia, hypertension and congestive heart  failure.  Diagnoses and all orders for this visit:  Deficiency anemia  Dietary folate deficiency anemia- He agrees to continue taking the folate supplement.  His anemia has not improved much.  I will check him for other vitamin deficiencies upon his return. -     CBC with Differential/Platelet; Future -     CBC with Differential/Platelet  Essential hypertension- His systolic blood pressure is down to 114 and he has a prerenal azotemia.  I recommended that he decrease the dose of his loop diuretic by 50%. -     BASIC METABOLIC PANEL WITH GFR; Future -     torsemide (DEMADEX) 20 MG tablet;  Take 1 tablet (20 mg total) by mouth daily. -     TSH; Future -     TSH -     BASIC METABOLIC PANEL WITH GFR  CHF with left ventricular diastolic dysfunction, NYHA class 1 ([place])- His fluid status has improved.  See above. -     torsemide (DEMADEX) 20 MG tablet; Take 1 tablet (20 mg total) by mouth daily.  Hypothyroidism, unspecified type- Thyroid replacement therapy is not indicated.  Other orders -     Flu Vaccine QUAD 6+ mos PF IM (Fluarix Quad PF)   I have changed Delonta E. Theilen's torsemide. I am also having him maintain his triamcinolone cream, Wixela Inhub, fluticasone, Klor-Con M20, atorvastatin, carvedilol, olmesartan, Cholecalciferol, and folic acid.  Meds ordered this encounter  Medications  . torsemide (DEMADEX) 20 MG tablet    Sig: Take 1 tablet (20 mg total) by mouth daily.    Dispense:  90 tablet    Refill:  1   I spent 50 minutes in preparing to see the patient by review of recent labs, imaging and procedures, obtaining and reviewing separately obtained history, communicating with the patient and family or caregiver, ordering medications, tests or procedures, and documenting clinical information in the EHR including the differential Dx, treatment, and any further evaluation and other management of 1. Dietary folate deficiency anemia 2. Essential hypertension 3. CHF with left  ventricular diastolic dysfunction, NYHA class 1 ([place]) 4. Hypothyroidism, unspecified type     Follow-up: Return in about 6 months (around 07/16/2020).  Scarlette Calico, MD

## 2020-01-18 ENCOUNTER — Encounter: Payer: Self-pay | Admitting: Internal Medicine

## 2020-01-18 LAB — CBC WITH DIFFERENTIAL/PLATELET
Absolute Monocytes: 441 cells/uL (ref 200–950)
Basophils Absolute: 32 cells/uL (ref 0–200)
Basophils Relative: 0.7 %
Eosinophils Absolute: 221 cells/uL (ref 15–500)
Eosinophils Relative: 4.9 %
HCT: 34.3 % — ABNORMAL LOW (ref 38.5–50.0)
Hemoglobin: 11.6 g/dL — ABNORMAL LOW (ref 13.2–17.1)
Lymphs Abs: 1643 cells/uL (ref 850–3900)
MCH: 31.4 pg (ref 27.0–33.0)
MCHC: 33.8 g/dL (ref 32.0–36.0)
MCV: 93 fL (ref 80.0–100.0)
MPV: 11.3 fL (ref 7.5–12.5)
Monocytes Relative: 9.8 %
Neutro Abs: 2165 cells/uL (ref 1500–7800)
Neutrophils Relative %: 48.1 %
Platelets: 191 10*3/uL (ref 140–400)
RBC: 3.69 10*6/uL — ABNORMAL LOW (ref 4.20–5.80)
RDW: 15.2 % — ABNORMAL HIGH (ref 11.0–15.0)
Total Lymphocyte: 36.5 %
WBC: 4.5 10*3/uL (ref 3.8–10.8)

## 2020-01-18 LAB — BASIC METABOLIC PANEL WITH GFR
BUN/Creatinine Ratio: 19 (calc) (ref 6–22)
BUN: 26 mg/dL — ABNORMAL HIGH (ref 7–25)
CO2: 28 mmol/L (ref 20–32)
Calcium: 9 mg/dL (ref 8.6–10.3)
Chloride: 103 mmol/L (ref 98–110)
Creat: 1.37 mg/dL — ABNORMAL HIGH (ref 0.70–1.18)
GFR, Est African American: 59 mL/min/{1.73_m2} — ABNORMAL LOW (ref >=60)
GFR, Est Non African American: 51 mL/min/{1.73_m2} — ABNORMAL LOW (ref >=60)
Glucose, Bld: 96 mg/dL (ref 65–99)
Potassium: 4.4 mmol/L (ref 3.5–5.3)
Sodium: 139 mmol/L (ref 135–146)

## 2020-01-18 LAB — TSH: TSH: 4.29 mIU/L (ref 0.40–4.50)

## 2020-01-25 ENCOUNTER — Ambulatory Visit (INDEPENDENT_AMBULATORY_CARE_PROVIDER_SITE_OTHER): Payer: Medicare Other

## 2020-01-25 ENCOUNTER — Other Ambulatory Visit: Payer: Self-pay

## 2020-01-25 DIAGNOSIS — E538 Deficiency of other specified B group vitamins: Secondary | ICD-10-CM | POA: Diagnosis not present

## 2020-01-25 MED ORDER — CYANOCOBALAMIN 1000 MCG/ML IJ SOLN
1000.0000 ug | Freq: Once | INTRAMUSCULAR | Status: AC
  Administered 2020-01-25: 1000 ug via INTRAMUSCULAR

## 2020-01-25 NOTE — Progress Notes (Signed)
Pt here for monthly B12 injection per Dr Ronnald Ramp.  B12 1061mg given IM left deltoid, and pt tolerated injection well.  Pt to schedule next monthly injection upon check out.

## 2020-02-16 ENCOUNTER — Other Ambulatory Visit: Payer: Self-pay | Admitting: Internal Medicine

## 2020-02-26 ENCOUNTER — Other Ambulatory Visit: Payer: Self-pay

## 2020-02-26 ENCOUNTER — Ambulatory Visit (INDEPENDENT_AMBULATORY_CARE_PROVIDER_SITE_OTHER): Payer: Medicare Other | Admitting: *Deleted

## 2020-02-26 DIAGNOSIS — E538 Deficiency of other specified B group vitamins: Secondary | ICD-10-CM | POA: Diagnosis not present

## 2020-02-26 MED ORDER — CYANOCOBALAMIN 1000 MCG/ML IJ SOLN
1000.0000 ug | Freq: Once | INTRAMUSCULAR | Status: AC
  Administered 2020-02-26: 1000 ug via INTRAMUSCULAR

## 2020-02-26 NOTE — Progress Notes (Signed)
Pls cosign for B12 inj../lmb  

## 2020-02-28 ENCOUNTER — Other Ambulatory Visit: Payer: Self-pay | Admitting: Internal Medicine

## 2020-02-28 DIAGNOSIS — L240 Irritant contact dermatitis due to detergents: Secondary | ICD-10-CM

## 2020-03-28 ENCOUNTER — Ambulatory Visit (INDEPENDENT_AMBULATORY_CARE_PROVIDER_SITE_OTHER): Payer: Medicare Other

## 2020-03-28 ENCOUNTER — Other Ambulatory Visit: Payer: Self-pay

## 2020-03-28 DIAGNOSIS — E538 Deficiency of other specified B group vitamins: Secondary | ICD-10-CM | POA: Diagnosis not present

## 2020-03-28 MED ORDER — CYANOCOBALAMIN 1000 MCG/ML IJ SOLN
1000.0000 ug | Freq: Once | INTRAMUSCULAR | Status: AC
  Administered 2020-03-28: 1000 ug via INTRAMUSCULAR

## 2020-03-28 NOTE — Progress Notes (Signed)
b12 given and tolerated well

## 2020-04-25 ENCOUNTER — Other Ambulatory Visit: Payer: Self-pay | Admitting: Internal Medicine

## 2020-04-25 DIAGNOSIS — E785 Hyperlipidemia, unspecified: Secondary | ICD-10-CM

## 2020-04-28 ENCOUNTER — Other Ambulatory Visit: Payer: Self-pay | Admitting: Internal Medicine

## 2020-04-28 DIAGNOSIS — I5030 Unspecified diastolic (congestive) heart failure: Secondary | ICD-10-CM

## 2020-04-28 DIAGNOSIS — I10 Essential (primary) hypertension: Secondary | ICD-10-CM

## 2020-04-29 ENCOUNTER — Ambulatory Visit: Payer: Medicare Other

## 2020-05-01 ENCOUNTER — Other Ambulatory Visit: Payer: Self-pay

## 2020-05-01 ENCOUNTER — Ambulatory Visit (INDEPENDENT_AMBULATORY_CARE_PROVIDER_SITE_OTHER): Payer: Medicare Other

## 2020-05-01 DIAGNOSIS — E538 Deficiency of other specified B group vitamins: Secondary | ICD-10-CM | POA: Diagnosis not present

## 2020-05-01 MED ORDER — CYANOCOBALAMIN 1000 MCG/ML IJ SOLN
1000.0000 ug | INTRAMUSCULAR | Status: DC
  Administered 2020-05-01 – 2020-07-04 (×3): 1000 ug via INTRAMUSCULAR

## 2020-05-01 NOTE — Progress Notes (Signed)
B12 Given and tolerated well

## 2020-05-31 ENCOUNTER — Other Ambulatory Visit: Payer: Self-pay

## 2020-06-03 ENCOUNTER — Ambulatory Visit (INDEPENDENT_AMBULATORY_CARE_PROVIDER_SITE_OTHER): Payer: Medicare Other

## 2020-06-03 ENCOUNTER — Other Ambulatory Visit: Payer: Self-pay

## 2020-06-03 DIAGNOSIS — E538 Deficiency of other specified B group vitamins: Secondary | ICD-10-CM | POA: Diagnosis not present

## 2020-06-03 NOTE — Progress Notes (Signed)
Pt here for monthly B12 injection per  Dr Ronnald Ramp.  B12 1048mg given IM left deltoid and pt tolerated injection well.  Pt to scheduled next B12 injection upon check out.

## 2020-07-04 ENCOUNTER — Other Ambulatory Visit: Payer: Self-pay

## 2020-07-04 ENCOUNTER — Ambulatory Visit (INDEPENDENT_AMBULATORY_CARE_PROVIDER_SITE_OTHER): Payer: Medicare Other

## 2020-07-04 DIAGNOSIS — E538 Deficiency of other specified B group vitamins: Secondary | ICD-10-CM | POA: Diagnosis not present

## 2020-07-04 NOTE — Progress Notes (Signed)
Pt here for monthly B12 injection per Dr Ronnald Ramp.  B12 1034mg given IM left deltoid and pt tolerated injection well.  Next B12 injection scheduled for 08/02/20.

## 2020-07-08 ENCOUNTER — Other Ambulatory Visit: Payer: Self-pay | Admitting: Internal Medicine

## 2020-07-08 DIAGNOSIS — I5030 Unspecified diastolic (congestive) heart failure: Secondary | ICD-10-CM

## 2020-07-08 DIAGNOSIS — I10 Essential (primary) hypertension: Secondary | ICD-10-CM

## 2020-07-30 ENCOUNTER — Telehealth: Payer: Self-pay | Admitting: Internal Medicine

## 2020-07-30 NOTE — Telephone Encounter (Signed)
Patient called and said that he has been experiencing SOB for a while with no other symptoms. Transferred to [facility].

## 2020-07-31 ENCOUNTER — Other Ambulatory Visit: Payer: Self-pay | Admitting: Internal Medicine

## 2020-07-31 DIAGNOSIS — J449 Chronic obstructive pulmonary disease, unspecified: Secondary | ICD-10-CM

## 2020-07-31 MED ORDER — TRELEGY ELLIPTA 100-62.5-25 MCG/INH IN AEPB: 1.0000 | INHALATION_SPRAY | Freq: Every day | RESPIRATORY_TRACT | 1 refills | Status: DC

## 2020-07-31 NOTE — Telephone Encounter (Signed)
Please advise on alternative.

## 2020-07-31 NOTE — Telephone Encounter (Signed)
[facility] Report/Call: Caller states he was prescribed a medication he can't aford. It is an inhaler. He wants to know if he can use something over the counter. Caller states: shortness of breath occasionally with walk in.  Advised see PCP within 3 days.  LVM for patient to call back and set up an appointment.

## 2020-08-01 ENCOUNTER — Other Ambulatory Visit: Payer: Self-pay

## 2020-08-02 ENCOUNTER — Ambulatory Visit (INDEPENDENT_AMBULATORY_CARE_PROVIDER_SITE_OTHER): Payer: Medicare Other | Admitting: *Deleted

## 2020-08-02 DIAGNOSIS — E538 Deficiency of other specified B group vitamins: Secondary | ICD-10-CM | POA: Diagnosis not present

## 2020-08-02 MED ORDER — CYANOCOBALAMIN 1000 MCG/ML IJ SOLN
1000.0000 ug | Freq: Once | INTRAMUSCULAR | Status: AC
  Administered 2020-08-02: 1000 ug via INTRAMUSCULAR

## 2020-08-02 NOTE — Progress Notes (Signed)
Pls cosign for B12 inj  In absence of PCP...Johny Chess

## 2020-08-09 ENCOUNTER — Other Ambulatory Visit: Payer: Self-pay | Admitting: Internal Medicine

## 2020-08-16 ENCOUNTER — Other Ambulatory Visit: Payer: Self-pay | Admitting: Internal Medicine

## 2020-08-16 DIAGNOSIS — I10 Essential (primary) hypertension: Secondary | ICD-10-CM

## 2020-08-16 DIAGNOSIS — I5030 Unspecified diastolic (congestive) heart failure: Secondary | ICD-10-CM

## 2020-08-20 ENCOUNTER — Telehealth: Payer: Self-pay | Admitting: Internal Medicine

## 2020-08-20 NOTE — Telephone Encounter (Signed)
LVM for pt to rtn my call to schedule AWV with [facility]. Please schedule AWV if pt calls the office.

## 2020-08-28 ENCOUNTER — Other Ambulatory Visit: Payer: Self-pay | Admitting: Internal Medicine

## 2020-08-28 DIAGNOSIS — L240 Irritant contact dermatitis due to detergents: Secondary | ICD-10-CM

## 2020-09-05 ENCOUNTER — Ambulatory Visit (INDEPENDENT_AMBULATORY_CARE_PROVIDER_SITE_OTHER): Payer: Medicare Other

## 2020-09-05 ENCOUNTER — Other Ambulatory Visit: Payer: Self-pay

## 2020-09-05 DIAGNOSIS — E538 Deficiency of other specified B group vitamins: Secondary | ICD-10-CM | POA: Diagnosis not present

## 2020-09-05 MED ORDER — CYANOCOBALAMIN 1000 MCG/ML IJ SOLN
1000.0000 ug | INTRAMUSCULAR | Status: DC
  Administered 2020-09-05 – 2020-11-08 (×3): 1000 ug via INTRAMUSCULAR

## 2020-09-05 NOTE — Progress Notes (Signed)
Pt here for monthly B12 injection per Dr. Ronnald Ramp  B12 1012mg given left deltoid IM, and pt tolerated injection well.  Next B12 injection scheduled for 10/04/20

## 2020-09-09 ENCOUNTER — Other Ambulatory Visit: Payer: Self-pay | Admitting: Internal Medicine

## 2020-09-09 DIAGNOSIS — I10 Essential (primary) hypertension: Secondary | ICD-10-CM

## 2020-09-09 DIAGNOSIS — I5030 Unspecified diastolic (congestive) heart failure: Secondary | ICD-10-CM

## 2020-09-27 ENCOUNTER — Telehealth: Payer: Self-pay | Admitting: Internal Medicine

## 2020-09-27 NOTE — Progress Notes (Signed)
  Chronic Care Management   Outreach Note  09/27/2020 Name: Kurt Blair MRN: [id number] DOB: Dec 26, 1945  Referred by: Janith Lima, MD Reason for referral : No chief complaint on file.   An unsuccessful telephone outreach was attempted today. The patient was referred to the pharmacist for assistance with care management and care coordination.   Follow Up Plan:   [place]

## 2020-10-04 ENCOUNTER — Other Ambulatory Visit: Payer: Self-pay

## 2020-10-04 ENCOUNTER — Ambulatory Visit (INDEPENDENT_AMBULATORY_CARE_PROVIDER_SITE_OTHER): Payer: Medicare Other

## 2020-10-04 DIAGNOSIS — E538 Deficiency of other specified B group vitamins: Secondary | ICD-10-CM | POA: Diagnosis not present

## 2020-10-04 NOTE — Progress Notes (Signed)
Pt here for monthly B12 injection per Dr Jones.   B12 1000mcg given IM left deltoid and pt tolerated injection well.    

## 2020-10-10 ENCOUNTER — Telehealth: Payer: Self-pay | Admitting: Internal Medicine

## 2020-10-10 NOTE — Progress Notes (Signed)
  Chronic Care Management   Outreach Note  10/10/2020 Name: Kurt Blair MRN: [id number] DOB: [birth year]  Referred by: Janith Lima, MD Reason for referral : No chief complaint on file.   A second unsuccessful telephone outreach was attempted today. The patient was referred to pharmacist for assistance with care management and care coordination.  Follow Up Plan:   Lauretta Grill [facility] Sched[facility]

## 2020-10-27 ENCOUNTER — Other Ambulatory Visit: Payer: Self-pay | Admitting: Internal Medicine

## 2020-10-27 DIAGNOSIS — E785 Hyperlipidemia, unspecified: Secondary | ICD-10-CM

## 2020-11-06 ENCOUNTER — Telehealth: Payer: Self-pay | Admitting: Internal Medicine

## 2020-11-06 NOTE — Chronic Care Management (AMB) (Signed)
  Chronic Care Management   Note  11/06/2020 Name: Kurt Blair MRN: [id number] DOB: 08-24-1945  Kurt Blair is a 75 y.o. year old male who is a primary care patient of Janith Lima, MD. I reached out to [employer] Shark by phone today in response to a referral sent by Kurt Blair's PCP, Janith Lima, MD.   Kurt Blair was given information about Chronic Care Management services today including:  CCM service includes personalized support from designated clinical staff supervised by his physician, including individualized plan of care and coordination with other care providers 24/7 contact phone numbers for assistance for urgent and routine care needs. Service will only be billed when office clinical staff spend 20 minutes or more in a month to coordinate care. Only one practitioner may furnish and bill the service in a calendar month. The patient may stop CCM services at any time (effective at the end of the month) by phone call to the office staff.   Patient agreed to services and verbal consent obtained.   Follow up plan:   Lauretta Grill [facility] Sched[facility]

## 2020-11-08 ENCOUNTER — Other Ambulatory Visit: Payer: Self-pay

## 2020-11-08 ENCOUNTER — Ambulatory Visit (INDEPENDENT_AMBULATORY_CARE_PROVIDER_SITE_OTHER): Payer: Medicare Other

## 2020-11-08 DIAGNOSIS — E538 Deficiency of other specified B group vitamins: Secondary | ICD-10-CM

## 2020-11-08 NOTE — Progress Notes (Signed)
B12 given  Please sign

## 2020-12-05 ENCOUNTER — Telehealth: Payer: Self-pay

## 2020-12-05 NOTE — Chronic Care Management (AMB) (Signed)
    Chronic Care Management Pharmacy Assistant   Name: Kurt Blair  MRN: [id number] DOB: 12-Aug-1945  [name] is an 75 y.o. year old male who presents for his initial CCM visit with the clinical pharmacist.  Reason for Encounter: Initial CCM Visit  Recent office visits:  11/08/20- Monthly B-12 Injection  07/31/20- Orders Only- Trelegy Ellipta 100-62.5-25 mcg/ inh alternative due to cost of wixela prescribed for SOB   Recent consult visits:  No visits noted   Hospital visits:  None in previous 6 months  Medications: Outpatient Encounter Medications as of 12/05/2020  Medication Sig   atorvastatin (LIPITOR) 80 MG tablet TAKE 1 TABLET BY MOUTH EVERY DAY   carvedilol (COREG) 12.5 MG tablet TAKE 1 TABLET (12.5 MG TOTAL) BY MOUTH 2 (TWO) TIMES DAILY WITH A MEAL.   Cholecalciferol 50 MCG (2000 UT) TABS Take 1 tablet (2,000 Units total) by mouth daily.   fluticasone (FLONASE) 50 MCG/ACT nasal spray SPRAY 2 SPRAYS INTO EACH NOSTRIL EVERY DAY   Fluticasone-Umeclidin-Vilant (TRELEGY ELLIPTA) 100-62.5-25 MCG/INH AEPB Inhale 1 puff into the lungs daily.   folic acid (FOLVITE) 1 MG tablet Take 1 tablet (1 mg total) by mouth daily.   KLOR-CON M20 20 MEQ tablet TAKE 1 TABLET BY MOUTH TWICE A DAY   levocetirizine (XYZAL) 5 MG tablet TAKE 1 TABLET BY MOUTH EVERY DAY IN THE EVENING   olmesartan (BENICAR) 40 MG tablet TAKE 1 TABLET BY MOUTH EVERY DAY   torsemide (DEMADEX) 20 MG tablet TAKE 1 TABLET BY MOUTH EVERY DAY   triamcinolone cream (KENALOG) 0.5 % Apply 1 application topically 3 (three) times daily.   Facility-Administered Encounter Medications as of 12/05/2020  Medication   cyanocobalamin ((VITAMIN B-12)) injection 1,000 mcg    Current Documented Medications atorvastatin 80 MG - 90 DS last filled 10/27/20 carvedilol 12.5 MG - 90 DS last filled 10/03/20 Cholecalciferol 50 MCG fluticasone 50 MCG/ACT - 90 DS last filled 12/08/19 Trelegy Ellipta 100-62.5-25 MCG/INH AEPB- 30 DS last  filled 53/61/44 folic acid 1 MG KLOR-CON M20 20 MEQ  levocetirizine  5 MG- 90 DS last filled 12/02/20 olmesartan 40 MG - 90 DS last filled 10/05/20 torsemide 20 MG - 90 DS last filled 11/13/20 triamcinolone cream 0.5 %  [employer] CPA, CMA

## 2020-12-10 ENCOUNTER — Ambulatory Visit (INDEPENDENT_AMBULATORY_CARE_PROVIDER_SITE_OTHER): Payer: Medicare Other

## 2020-12-10 ENCOUNTER — Other Ambulatory Visit: Payer: Self-pay

## 2020-12-10 DIAGNOSIS — J449 Chronic obstructive pulmonary disease, unspecified: Secondary | ICD-10-CM

## 2020-12-10 DIAGNOSIS — I5030 Unspecified diastolic (congestive) heart failure: Secondary | ICD-10-CM | POA: Diagnosis not present

## 2020-12-10 DIAGNOSIS — I10 Essential (primary) hypertension: Secondary | ICD-10-CM

## 2020-12-10 DIAGNOSIS — E785 Hyperlipidemia, unspecified: Secondary | ICD-10-CM | POA: Diagnosis not present

## 2020-12-10 NOTE — Progress Notes (Signed)
 Chronic Care Management Pharmacy Note  12/10/2020 Name:  Kurt Blair MRN:  [id number] DOB:  04/19/1946  Summary: - Patient reports that he is doing well, has no issues or complaints with current medications - Folate, B12, and VitD low on last checks - taking daily vitamin d supplementation as well as monthly vitamin b12 injections - has not been taking folic acid, reviewed recommended dosage with patient and agreeable to start - Reports to no issues since starting trelegy, notes that he has had no exacerbations in the past 6 months, no issues or concerns with shortness of breath or breathing   Recommendations/Changes made from today's visit: - Recommending for patient to start folic acid 1mg daily, also patient to have albuterol HFA inhaler for use as needed  -Patient to monitor blood pressure 1-2 times weekly and reach out should BP elevate from goal (<130/80)  Subjective: Kurt Blair is an 75 y.o. year old male who is a primary patient of Jones, Thomas L, MD.  The CCM team was consulted for assistance with disease management and care coordination needs.    Engaged with patient by telephone for initial visit in response to provider referral for pharmacy case management and/or care coordination services.   Consent to Services:  The patient was given the following information about Chronic Care Management services today, agreed to services, and gave verbal consent: 1. CCM service includes personalized support from designated clinical staff supervised by the primary care provider, including individualized plan of care and coordination with other care providers 2. 24/7 contact phone numbers for assistance for urgent and routine care needs. 3. Service will only be billed when office clinical staff spend 20 minutes or more in a month to coordinate care. 4. Only one practitioner may furnish and bill the service in a calendar month. 5.The patient may stop CCM services at any time (effective at  the end of the month) by phone call to the office staff. 6. The patient will be responsible for cost sharing (co-pay) of up to 20% of the service fee (after annual deductible is met). Patient agreed to services and consent obtained.  Patient Care Team: Jones, Thomas L, MD as PCP - General (Internal Medicine) Kurt Blair, RPH as Pharmacist (Pharmacist)  Recent office visits:  11/08/20- Monthly B-12 Injection 07/31/20- Orders Only- Trelegy Ellipta 100-62.5-25 mcg/ inh alternative due to cost of wixela prescribed for SOB  01/17/2020 - PCP visit - noted decrease in lower extremity edema - decreased torsemide to 20mg daily   Recent consult visits:  No visits noted    Hospital visits:  None in previous 6 months  Objective:  Lab Results  Component Value Date   CREATININE 1.37 (H) 01/17/2020   BUN 26 (H) 01/17/2020   GFR 85.56 10/17/2019   GFRNONAA 51 (Blair) 01/17/2020   GFRAA 59 (Blair) 01/17/2020   NA 139 01/17/2020   K 4.4 01/17/2020   CALCIUM 9.0 01/17/2020   CO2 28 01/17/2020   GLUCOSE 96 01/17/2020    Lab Results  Component Value Date/Time   HGBA1C 6.4 10/17/2019 02:31 PM   HGBA1C 5.9 09/22/2017 09:08 AM   GFR 85.56 10/17/2019 02:31 PM   GFR 74.09 09/06/2018 03:03 PM    Last diabetic Eye exam:  No results found for: HMDIABEYEEXA  Last diabetic Foot exam:  No results found for: HMDIABFOOTEX   Lab Results  Component Value Date   CHOL 131 10/17/2019   HDL 34.60 (Blair) 10/17/2019   LDLCALC 48 09/22/2017     LDLDIRECT 65.0 10/17/2019   TRIG 212.0 (H) 10/17/2019   CHOLHDL 4 10/17/2019    Hepatic Function Latest Ref Rng & Units 10/17/2019 06/29/2018 09/22/2017  Total Protein 6.0 - 8.3 g/dL 7.0 7.9 6.7  Albumin 3.5 - 5.2 g/dL 4.2 4.2 3.8  AST 0 - 37 U/Blair 26 30 17  ALT 0 - 53 U/Blair 18 21 11  Alk Phosphatase 39 - 117 U/Blair 45 55 45  Total Bilirubin 0.2 - 1.2 mg/dL 1.0 1.6(H) 0.8  Bilirubin, Direct 0.0 - 0.3 mg/dL 0.2 - 0.2    Lab Results  Component Value Date/Time   TSH 4.29  01/17/2020 03:36 PM   TSH 4.63 (H) 10/17/2019 02:31 PM    CBC Latest Ref Rng & Units 01/17/2020 11/01/2019 10/17/2019  WBC 3.8 - 10.8 Thousand/uL 4.5 6.3 5.0  Hemoglobin 13.2 - 17.1 g/dL 11.6(Blair) 13.4 13.2  Hematocrit 38.5 - 50.0 % 34.3(Blair) 37.7(Blair) 38.0(Blair)  Platelets 140 - 400 Thousand/uL 191 193.0 203.0    Lab Results  Component Value Date/Time   VD25OH 20.81 (Blair) 10/17/2019 02:31 PM   VD25OH 16.10 (Blair) 06/29/2018 03:22 PM    Clinical ASCVD: No  The 10-year ASCVD risk score (Goff DC Jr., et al., 2013) is: 17.4%   Values used to calculate the score:     Age: 75 years     Sex: Male     Is Non-Hispanic African American: Yes     Diabetic: No     Tobacco smoker: No     Systolic Blood Pressure: 114 mmHg     Is BP treated: Yes     HDL Cholesterol: 34.6 mg/dL     Total Cholesterol: 131 mg/dL    Depression screen PHQ 2/9 10/17/2019 08/04/2016 06/04/2016  Decreased Interest 0 0 0  Down, Depressed, Hopeless 0 0 0  PHQ - 2 Score 0 0 0  Altered sleeping 0 - -  Tired, decreased energy 0 - -  Change in appetite 0 - -  Feeling bad or failure about yourself  0 - -  Trouble concentrating 0 - -  Moving slowly or fidgety/restless 0 - -  Suicidal thoughts 0 - -  PHQ-9 Score 0 - -  Difficult doing work/chores Not difficult at all - -    Social History   Tobacco Use  Smoking Status Former   Packs/day: 0.50   Years: 50.00   Pack years: 25.00   Types: Cigarettes   Quit date: 05/11/2013   Years since quitting: 7.5  Smokeless Tobacco Never   BP Readings from Last 3 Encounters:  01/17/20 114/68  10/17/19 (!) 152/88  04/03/19 116/78   Pulse Readings from Last 3 Encounters:  01/17/20 62  10/17/19 68  04/03/19 63   Wt Readings from Last 3 Encounters:  01/17/20 255 lb (115.7 kg)  10/17/19 262 lb (118.8 kg)  04/03/19 260 lb 12.8 oz (118.3 kg)   BMI Readings from Last 3 Encounters:  01/17/20 36.59 kg/m  10/17/19 37.59 kg/m  04/03/19 37.42 kg/m    Assessment/Interventions: Review of  patient past medical history, allergies, medications, health status, including review of consultants reports, laboratory and other test data, was performed as part of comprehensive evaluation and provision of chronic care management services.   SDOH:  (Social Determinants of Health) assessments and interventions performed: Yes  SDOH Screenings   Alcohol Screen: Not on file  Depression (PHQ2-9): Not on file  Financial Resource Strain: Low Risk    Difficulty of Paying Living Expenses: Not hard at all    Food Insecurity: Not on file  Housing: Not on file  Physical Activity: Not on file  Social Connections: Not on file  Stress: Not on file  Tobacco Use: Medium Risk   Smoking Tobacco Use: Former   Smokeless Tobacco Use: Never  Transportation Needs: Not on file    CCM Care Plan  Allergies  Allergen Reactions   Latex Hives and Rash    Medications Reviewed Today     Reviewed by Jones, Thomas L, MD (Physician) on 01/17/20 at 1527  Med List Status: <None>   Medication Order Taking? Sig Documenting Provider Last Dose Status Informant  atorvastatin (LIPITOR) 80 MG tablet [id number] Yes Take 1 tablet (80 mg total) by mouth daily. Jones, Thomas L, MD Taking Active   carvedilol (COREG) 12.5 MG tablet [id number] Yes Take 1 tablet (12.5 mg total) by mouth 2 (two) times daily with a meal. Jones, Thomas L, MD Taking Active   Cholecalciferol 50 MCG (2000 UT) TABS [id number] Yes Take 1 tablet (2,000 Units total) by mouth daily. Jones, Thomas L, MD Taking Active   fluticasone (FLONASE) 50 MCG/ACT nasal spray [id number] Yes SPRAY 2 SPRAYS INTO EACH NOSTRIL EVERY DAY Jones, Thomas L, MD Taking Active   folic acid (FOLVITE) 1 MG tablet [id number] Yes Take 1 tablet (1 mg total) by mouth daily. Jones, Thomas L, MD Taking Active   KLOR-CON M20 20 MEQ tablet [id number] Yes TAKE 1 TABLET BY MOUTH TWICE A DAY Jones, Thomas L, MD Taking Active     Discontinued 08/29/19 0527 (Reorder)   olmesartan (BENICAR) 40 MG  tablet [id number] Yes Take 1 tablet (40 mg total) by mouth daily. Jones, Thomas L, MD Taking Active     Discontinued 01/02/20 1824 (Reorder)   torsemide (DEMADEX) 20 MG tablet [id number] Yes Take 1 tablet (20 mg total) by mouth daily. Jones, Thomas L, MD  Active   triamcinolone cream (KENALOG) 0.5 % [id number] Yes Apply 1 application topically 3 (three) times daily. Jones, Thomas L, MD Taking Active   WIXELA INHUB 250-50 MCG/DOSE AEPB [id number] Yes TAKE 1 PUFF BY MOUTH TWICE A DAY [name], Michael B, MD Taking Active             Patient Active Problem List   Diagnosis Date Noted   Dietary folate deficiency anemia 11/01/2019   Dyslipidemia, goal LDL below 100 10/17/2019   Benign prostatic hyperplasia without lower urinary tract symptoms 10/17/2019   Vitamin D deficiency disease 06/29/2018   Morbid obesity due to excess calories complicated by hbp/ hyperlipidemia  02/05/2018   COPD GOLD 0  12/23/2017   Exercise hypoxemia 11/20/2017   CHF with left ventricular diastolic dysfunction, NYHA class 1 (HCC) 11/09/2017   Arthritis of shoulder region, degenerative 06/04/2016   Osteoarthritis of right knee 06/04/2016   Essential hypertension 04/14/2015   Hypothyroidism 11/13/2014   Routine general medical examination at a health care facility 06/03/2012   ALCOHOL USE 06/07/2008   NASH (nonalcoholic steatohepatitis) 06/07/2008   SUBACROMIAL BURSITIS, RIGHT 06/07/2008   DOE (dyspnea on exertion) 06/07/2008   ERECTILE DYSFUNCTION 02/16/2007   SMOKER 02/16/2007   VENOUS INSUFFICIENCY 02/16/2007   Hyperglycemia 02/16/2007    Immunization History  Administered Date(s) Administered   Fluad Quad(high Dose 65+) 01/11/2019   Influenza, High Dose Seasonal PF 02/21/2016, 02/04/2018   Influenza,inj,Quad PF,6+ Mos 03/12/2014, 04/05/2017, 01/17/2020   Moderna Sars-Covid-2 Vaccination 06/24/2019, 07/22/2019   Pneumococcal Conjugate-13 03/12/2014   Pneumococcal Polysaccharide-23 01/02/2011, 02/21/2016,  01/11/2019   Td 07/09/2001   Tdap 03/12/2014      Conditions to be addressed/monitored:  Hypertension, Hyperlipidemia, Heart Failure, COPD, Allergic Rhinitis, and Vitamin D/ Folate/ B12 deficiency   Care Plan : CCM Care Plan  Updates made by Tomasa Blase, RPH since 12/10/2020 12:00 AM     Problem: HTN, HF, COPD, HLD   Priority: High  Onset Date: 12/10/2020     Long-Range Goal: Disease Management   Start Date: 12/10/2020  Expected End Date: 06/12/2021  This Visit's Progress: On track  Priority: High  Note:   Current Barriers:  Unable to independently monitor therapeutic efficacy  Pharmacist Clinical Goal(s):  Patient will achieve adherence to monitoring guidelines and medication adherence to achieve therapeutic efficacy maintain control of Blood pressure and COPD as evidenced by BP logs and frequency of rescue inhaler use / COPD exacerbations  through collaboration with PharmD and provider.   Interventions: 1:1 collaboration with Janith Lima, MD regarding development and update of comprehensive plan of care as evidenced by provider attestation and co-signature Inter-disciplinary care team collaboration (see longitudinal plan of care) Comprehensive medication review performed; medication list updated in electronic medical record  Hypertension (BP goal <130/80)/ Heart Failure (Goal: manage symptoms and prevent exacerbations) -Controlled -Last ejection fraction: 55-60% (Date: 10/08/2017) -HF type: Diastolic -NYHA Class: I (no actitivty limitation) -Current treatment: Carvedilol 12.69m - 1 tablet twice daily  Torsemide 22m- 1 tablet daily  Olmesartan 403m 1 tablet daily  Potassium Chloride 69m43m 1 tablet twice daily  -Medications previously tried: lisinopril, losartan, hydrochlorothiazide, spironolactone   -Current home BP/HR readings: reports that he has not been checking at home recently, latest in office readings 114/68, 152/88, 116/78 -Current dietary habits: reports  that he does add salt to his food, notes that it is small amount when used -Current exercise habits: reports to exercising 3-5 days a week - exercise bike / lifting weights -Educated on Benefits of medications for managing symptoms and prolonging life Proper diuretic administration and potassium supplementation Importance of blood pressure control -Counseled to monitor BP at home 1-2 times weekly, document, and provide log at future appointments -Counseled on diet and exercise extensively Recommended to continue current medication  Hyperlipidemia: (LDL goal < 100) -Controlled -Last LDL level - 65mg26m(10/17/2019) -Current treatment: Atorvastatin 80mg 73mtablet daily  -Medications previously tried: simvastatin, ezetimibe  -Current dietary patterns: reports to moderation of intake of foods that can be high in cholesterol -Current exercise habits: 3-5 days a week- on exercise bike or lifting weights  -Educated on Cholesterol goals;  Benefits of statin for ASCVD risk reduction; Importance of limiting foods high in cholesterol; Exercise goal of 150 minutes per week; -Counseled on diet and exercise extensively Recommended to continue current medication  COPD (Goal: control symptoms and prevent exacerbations) -Controlled -Current treatment  Trelegy Ellipta 100-62.5-25mcg - 1 puff daily  -Medications previously tried: symbicort, advair, wixela,   -Gold Grade: 0 -Pulmonary function testing: 12/22/2017 - FEV1 2.08 (75%) -Exacerbations requiring treatment in last 6 months: 0 -Patient reports consistent use of maintenance inhaler -Frequency of rescue inhaler use: n/a -Counseled on Proper inhaler technique; Benefits of consistent maintenance inhaler use When to use rescue inhaler Differences between maintenance and rescue inhalers -Recommended for patient to have albuterol rescue inhaler on hand in case he would need  Allergic Rhinitis (Goal: prevention of allergy attack/ treatment of  symptoms) -Controlled -Current treatment  Levocetirizine 5mg - 35mablet daily  -Medications previously tried: n/a  -Recommended to continue current medication  Health Maintenance -Vaccine gaps: Shingles, COVID, and Flu  vaccines -Current therapy:  Vitamin B12 injection - 1000mcg every 30 days  Last B12 level - 190 pg/mL (11/01/2019) Folic Acid 1mg - 1 tablet daily - has not been taking  Last Folate Level - 3.1 ng/mL (11/01/2019) Vitamin D3 2000units - 1 tablet daily  Last VitD level - 20.81 ng/mL (10/17/2019) Icy Hot Pain relief cream - applied daily as needed  Multivitamin - 1 tablet daily  -Patient is satisfied with current therapy and denies issues -Recommended to continue current medication - patient to start taking folic acid 1mg daily   Patient Goals/Self-Care Activities Patient will:  - take medications as prescribed check blood pressure 1-2 times weekly, document, and provide at future appointments target a minimum of 150 minutes of moderate intensity exercise weekly engage in dietary modifications by reduction of sodium intake  Follow Up Plan: Telephone follow up appointment with care management team member scheduled for: The patient has been provided with contact information for the care management team and has been advised to call with any health related questions or concerns.        Medication Assistance: None required.  Patient affirms current coverage meets needs.  Care Gaps: Colonoscopy   Patient's preferred pharmacy is:  [id number] - [place], [place] - [address] [place] [address] Phone: [phone] Fax: [phone]   Uses pill box? Yes Pt endorses 90-95% compliance  Care Plan and Follow Up Patient Decision:  Patient agrees to Care Plan and Follow-up.  Plan: Telephone follow up appointment with care management team member scheduled for:  6 months and The patient has been  provided with contact information for the care management team and has been advised to call with any health related questions or concerns.   Daniel Blair Szabat, PharmD Clinical Pharmacist, [place]  

## 2020-12-10 NOTE — Patient Instructions (Signed)
Visit Information   PATIENT GOALS:   Goals Addressed             This Visit's Progress    Track and Manage My Blood Pressure-Hypertension       Timeframe:  Long-Range Goal Priority:  High Start Date:   12/10/2020                          Expected End Date: 06/12/2021                      Follow Up Date 06/11/2021   - check blood pressure weekly - choose a place to take my blood pressure (home, clinic or office, retail store) - write blood pressure results in a log or diary    Why is this important?   You won't feel high blood pressure, but it can still hurt your blood vessels.  High blood pressure can cause heart or kidney problems. It can also cause a stroke.  Making lifestyle changes like losing a little weight or eating less salt will help.  Checking your blood pressure at home and at different times of the day can help to control blood pressure.  If the doctor prescribes medicine remember to take it the way the doctor ordered.  Call the office if you cannot afford the medicine or if there are questions about it.        Track and Manage My Symptoms-COPD       Timeframe:  Long-Range Goal Priority:  High Start Date:  12/10/2020                         Expected End Date:  06/12/2021                     Follow Up Date 06/11/2021   - begin a symptom diary - develop a rescue plan - eliminate symptom triggers at home - follow rescue plan if symptoms flare-up - keep follow-up appointments    Why is this important?   Tracking your symptoms and other information about your health helps your doctor plan your care.  Write down the symptoms, the time of day, what you were doing and what medicine you are taking.  You will soon learn how to manage your symptoms.           Consent to CCM Services: Mr. Tromp was given information about Chronic Care Management services today including:  CCM service includes personalized support from designated clinical staff supervised by his physician,  including individualized plan of care and coordination with other care providers 24/7 contact phone numbers for assistance for urgent and routine care needs. Service will only be billed when office clinical staff spend 20 minutes or more in a month to coordinate care. Only one practitioner may furnish and bill the service in a calendar month. The patient may stop CCM services at any time (effective at the end of the month) by phone call to the office staff. The patient will be responsible for cost sharing (co-pay) of up to 20% of the service fee (after annual deductible is met).  Patient agreed to services and verbal consent obtained.   Patient verbalizes understanding of instructions provided today and agrees to view in [place].   Telephone follow up appointment with care management team member scheduled for: 6 months The patient has been provided with contact information for the care  management team and has been advised to call with any health related questions or concerns.   Tomasa Blase, PharmD Clinical Pharmacist, [place]   CLINICAL CARE PLAN: Patient Care Plan: CCM Care Plan     Problem Identified: HTN, HF, COPD, HLD   Priority: High  Onset Date: 12/10/2020     Long-Range Goal: Disease Management   Start Date: 12/10/2020  Expected End Date: 06/12/2021  This Visit's Progress: On track  Priority: High  Note:   Current Barriers:  Unable to independently monitor therapeutic efficacy  Pharmacist Clinical Goal(s):  Patient will achieve adherence to monitoring guidelines and medication adherence to achieve therapeutic efficacy maintain control of Blood pressure and COPD as evidenced by BP logs and frequency of rescue inhaler use / COPD exacerbations  through collaboration with PharmD and provider.   Interventions: 1:1 collaboration with Janith Lima, MD regarding development and update of comprehensive plan of care as evidenced by provider attestation and  co-signature Inter-disciplinary care team collaboration (see longitudinal plan of care) Comprehensive medication review performed; medication list updated in electronic medical record  Hypertension (BP goal <130/80)/ Heart Failure (Goal: manage symptoms and prevent exacerbations) -Controlled -Last ejection fraction: 55-60% (Date: 10/08/2017) -HF type: Diastolic -NYHA Class: I (no actitivty limitation) -Current treatment: Carvedilol 12.58m - 1 tablet twice daily  Torsemide 226m- 1 tablet daily  Olmesartan 4031m 1 tablet daily  Potassium Chloride 31m83m 1 tablet twice daily  -Medications previously tried: lisinopril, losartan, hydrochlorothiazide, spironolactone   -Current home BP/HR readings: reports that he has not been checking at home recently, latest in office readings 114/68, 152/88, 116/78 -Current dietary habits: reports that he does add salt to his food, notes that it is small amount when used -Current exercise habits: reports to exercising 3-5 days a week - exercise bike / lifting weights -Educated on Benefits of medications for managing symptoms and prolonging life Proper diuretic administration and potassium supplementation Importance of blood pressure control -Counseled to monitor BP at home 1-2 times weekly, document, and provide log at future appointments -Counseled on diet and exercise extensively Recommended to continue current medication  Hyperlipidemia: (LDL goal < 100) -Controlled -Last LDL level - 65mg75m(10/17/2019) -Current treatment: Atorvastatin 80mg 12mtablet daily  -Medications previously tried: simvastatin, ezetimibe  -Current dietary patterns: reports to moderation of intake of foods that can be high in cholesterol -Current exercise habits: 3-5 days a week- on exercise bike or lifting weights  -Educated on Cholesterol goals;  Benefits of statin for ASCVD risk reduction; Importance of limiting foods high in cholesterol; Exercise goal of 150 minutes per  week; -Counseled on diet and exercise extensively Recommended to continue current medication  COPD (Goal: control symptoms and prevent exacerbations) -Controlled -Current treatment  Trelegy Ellipta 100-62.5-25mcg - 1 puff daily  -Medications previously tried: symbicort, advair, wixela,   -Gold Grade: 0 -Pulmonary function testing: 12/22/2017 - FEV1 2.08 (75%) -Exacerbations requiring treatment in last 6 months: 0 -Patient reports consistent use of maintenance inhaler -Frequency of rescue inhaler use: n/a -Counseled on Proper inhaler technique; Benefits of consistent maintenance inhaler use When to use rescue inhaler Differences between maintenance and rescue inhalers -Recommended for patient to have albuterol rescue inhaler on hand in case he would need  Allergic Rhinitis (Goal: prevention of allergy attack/ treatment of symptoms) -Controlled -Current treatment  Levocetirizine 5mg - 2mablet daily  -Medications previously tried: n/a  -Recommended to continue current medication  Health Maintenance -Vaccine gaps: Shingles, COVID, and Flu  vaccines -Current therapy:  Vitamin B12 injection - 1016mg every 30 days  Last B12 level - 190 pg/mL (69/37/3428 Folic Acid 149m- 1 tablet daily - has not been taking  Last Folate Level - 3.1 ng/mL (11/01/2019) Vitamin D3 2000units - 1 tablet daily  Last VitD level - 20.81 ng/mL (10/17/2019) Icy Hot Pain relief cream - applied daily as needed  Multivitamin - 1 tablet daily  -Patient is satisfied with current therapy and denies issues -Recommended to continue current medication - patient to start taking folic acid 76m53maily   Patient Goals/Self-Care Activities Patient will:  - take medications as prescribed check blood pressure 1-2 times weekly, document, and provide at future appointments target a minimum of 150 minutes of moderate intensity exercise weekly engage in dietary modifications by reduction of sodium intake  Follow Up Plan:  Telephone follow up appointment with care management team member scheduled for: The patient has been provided with contact information for the care management team and has been advised to call with any health related questions or concerns.

## 2020-12-11 ENCOUNTER — Other Ambulatory Visit: Payer: Self-pay | Admitting: Internal Medicine

## 2020-12-11 DIAGNOSIS — J449 Chronic obstructive pulmonary disease, unspecified: Secondary | ICD-10-CM

## 2020-12-11 MED ORDER — ALBUTEROL SULFATE HFA 108 (90 BASE) MCG/ACT IN AERS
2.0000 | INHALATION_SPRAY | Freq: Four times a day (QID) | RESPIRATORY_TRACT | 5 refills | Status: DC | PRN
Start: 2020-12-11 — End: 2021-02-15

## 2020-12-13 ENCOUNTER — Other Ambulatory Visit: Payer: Self-pay

## 2020-12-13 ENCOUNTER — Ambulatory Visit (INDEPENDENT_AMBULATORY_CARE_PROVIDER_SITE_OTHER): Payer: Medicare Other

## 2020-12-13 DIAGNOSIS — E538 Deficiency of other specified B group vitamins: Secondary | ICD-10-CM | POA: Diagnosis not present

## 2020-12-13 MED ORDER — CYANOCOBALAMIN 1000 MCG/ML IJ SOLN
1000.0000 ug | Freq: Once | INTRAMUSCULAR | Status: AC
  Administered 2020-12-13: 1000 ug via INTRAMUSCULAR

## 2020-12-13 NOTE — Progress Notes (Signed)
Pt ws given B12 injection w/o any complications.

## 2020-12-30 ENCOUNTER — Other Ambulatory Visit: Payer: Self-pay | Admitting: Internal Medicine

## 2020-12-30 DIAGNOSIS — I10 Essential (primary) hypertension: Secondary | ICD-10-CM

## 2020-12-30 DIAGNOSIS — I5030 Unspecified diastolic (congestive) heart failure: Secondary | ICD-10-CM

## 2021-01-02 ENCOUNTER — Telehealth: Payer: Self-pay | Admitting: Internal Medicine

## 2021-01-02 DIAGNOSIS — I5030 Unspecified diastolic (congestive) heart failure: Secondary | ICD-10-CM

## 2021-01-02 DIAGNOSIS — I10 Essential (primary) hypertension: Secondary | ICD-10-CM

## 2021-01-03 ENCOUNTER — Other Ambulatory Visit: Payer: Self-pay | Admitting: Internal Medicine

## 2021-01-03 DIAGNOSIS — I5030 Unspecified diastolic (congestive) heart failure: Secondary | ICD-10-CM

## 2021-01-03 DIAGNOSIS — I10 Essential (primary) hypertension: Secondary | ICD-10-CM

## 2021-01-03 MED ORDER — OLMESARTAN MEDOXOMIL 40 MG PO TABS: 40.0000 mg | ORAL_TABLET | Freq: Every day | ORAL | 0 refills | Status: DC

## 2021-01-03 NOTE — Telephone Encounter (Signed)
Patient has called to make his annual appointment scheduled or 9.13.22 as that is the next available.  Pt is wondering about his refill on OLMESARTAN   Please advise

## 2021-01-14 ENCOUNTER — Other Ambulatory Visit: Payer: Self-pay | Admitting: Internal Medicine

## 2021-01-14 DIAGNOSIS — J449 Chronic obstructive pulmonary disease, unspecified: Secondary | ICD-10-CM

## 2021-01-17 ENCOUNTER — Other Ambulatory Visit: Payer: Self-pay

## 2021-01-17 ENCOUNTER — Ambulatory Visit (INDEPENDENT_AMBULATORY_CARE_PROVIDER_SITE_OTHER): Payer: Medicare Other

## 2021-01-17 DIAGNOSIS — E538 Deficiency of other specified B group vitamins: Secondary | ICD-10-CM | POA: Diagnosis not present

## 2021-01-17 MED ORDER — CYANOCOBALAMIN 1000 MCG/ML IJ SOLN
1000.0000 ug | Freq: Once | INTRAMUSCULAR | Status: AC
  Administered 2021-01-17: 1000 ug via INTRAMUSCULAR

## 2021-01-17 NOTE — Progress Notes (Signed)
Pt here for monthly B12 injection per Dr. Ronnald Ramp  B12 1029mg given IM, and pt tolerated injection well.  Next B12 injection scheduled for 02/19/21

## 2021-01-21 ENCOUNTER — Ambulatory Visit (INDEPENDENT_AMBULATORY_CARE_PROVIDER_SITE_OTHER): Payer: Medicare Other | Admitting: Internal Medicine

## 2021-01-21 ENCOUNTER — Other Ambulatory Visit: Payer: Self-pay

## 2021-01-21 ENCOUNTER — Encounter: Payer: Self-pay | Admitting: Internal Medicine

## 2021-01-21 VITALS — BP 180/102 | HR 64 | Temp 98.5°F | Resp 16 | Ht 70.0 in | Wt 257.0 lb

## 2021-01-21 DIAGNOSIS — R0600 Dyspnea, unspecified: Secondary | ICD-10-CM

## 2021-01-21 DIAGNOSIS — Z23 Encounter for immunization: Secondary | ICD-10-CM | POA: Insufficient documentation

## 2021-01-21 DIAGNOSIS — K7581 Nonalcoholic steatohepatitis (NASH): Secondary | ICD-10-CM | POA: Diagnosis not present

## 2021-01-21 DIAGNOSIS — Z0000 Encounter for general adult medical examination without abnormal findings: Secondary | ICD-10-CM

## 2021-01-21 DIAGNOSIS — R7303 Prediabetes: Secondary | ICD-10-CM | POA: Insufficient documentation

## 2021-01-21 DIAGNOSIS — E785 Hyperlipidemia, unspecified: Secondary | ICD-10-CM

## 2021-01-21 DIAGNOSIS — I5030 Unspecified diastolic (congestive) heart failure: Secondary | ICD-10-CM

## 2021-01-21 DIAGNOSIS — Z0001 Encounter for general adult medical examination with abnormal findings: Secondary | ICD-10-CM | POA: Diagnosis not present

## 2021-01-21 DIAGNOSIS — I10 Essential (primary) hypertension: Secondary | ICD-10-CM | POA: Diagnosis not present

## 2021-01-21 DIAGNOSIS — R9431 Abnormal electrocardiogram [ECG] [EKG]: Secondary | ICD-10-CM | POA: Diagnosis not present

## 2021-01-21 DIAGNOSIS — E559 Vitamin D deficiency, unspecified: Secondary | ICD-10-CM | POA: Diagnosis not present

## 2021-01-21 DIAGNOSIS — N400 Enlarged prostate without lower urinary tract symptoms: Secondary | ICD-10-CM

## 2021-01-21 DIAGNOSIS — E039 Hypothyroidism, unspecified: Secondary | ICD-10-CM

## 2021-01-21 DIAGNOSIS — E538 Deficiency of other specified B group vitamins: Secondary | ICD-10-CM

## 2021-01-21 DIAGNOSIS — J449 Chronic obstructive pulmonary disease, unspecified: Secondary | ICD-10-CM

## 2021-01-21 DIAGNOSIS — R0609 Other forms of dyspnea: Secondary | ICD-10-CM | POA: Insufficient documentation

## 2021-01-21 DIAGNOSIS — D520 Dietary folate deficiency anemia: Secondary | ICD-10-CM | POA: Diagnosis not present

## 2021-01-21 MED ORDER — FOLIC ACID 1 MG PO TABS
1.0000 mg | ORAL_TABLET | Freq: Every day | ORAL | 1 refills | Status: DC
Start: 2021-01-21 — End: 2021-07-25

## 2021-01-21 MED ORDER — TORSEMIDE 20 MG PO TABS: 20.0000 mg | ORAL_TABLET | Freq: Two times a day (BID) | ORAL | 0 refills | Status: DC

## 2021-01-21 MED ORDER — CARVEDILOL 12.5 MG PO TABS: 12.5000 mg | ORAL_TABLET | Freq: Two times a day (BID) | ORAL | 0 refills | Status: DC

## 2021-01-21 MED ORDER — TRELEGY ELLIPTA 100-62.5-25 MCG/INH IN AEPB: 1.0000 | INHALATION_SPRAY | Freq: Every day | RESPIRATORY_TRACT | 1 refills | Status: DC

## 2021-01-21 NOTE — Progress Notes (Addendum)
Subjective:  Patient ID: Kurt Blair, male    DOB: 1945/06/21  Age: 75 y.o. MRN: [id number]  CC: Annual Exam, Hypertension, and Hyperlipidemia  This visit occurred during the SARS-CoV-2 public health emergency.  Safety protocols were in place, including screening questions prior to the visit, additional usage of staff PPE, and extensive cleaning of exam room while observing appropriate contact time as indicated for disinfecting solutions.    HPI TAYLIN LEDER presents for a CPX and f/up -   He complains of a several month history of DOE.  He has not been monitoring his blood pressure.  He takes carvedilol inconsistently.  He tells me he has been taking the listed doses of torsemide and olmesartan.  He has mild weight gain and some lower extremity edema.  He denies chest pain, diaphoresis, dizziness, lightheadedness, or near syncope.  Outpatient Medications Prior to Visit  Medication Sig Dispense Refill   albuterol (VENTOLIN HFA) 108 (90 Base) MCG/ACT inhaler Inhale 2 puffs into the lungs every 6 (six) hours as needed for wheezing or shortness of breath. 18 g 5   atorvastatin (LIPITOR) 80 MG tablet TAKE 1 TABLET BY MOUTH EVERY DAY 90 tablet 1   Cholecalciferol 50 MCG (2000 UT) TABS Take 1 tablet (2,000 Units total) by mouth daily. 90 tablet 1   KLOR-CON M20 20 MEQ tablet TAKE 1 TABLET BY MOUTH TWICE A DAY 180 tablet 1   levocetirizine (XYZAL) 5 MG tablet TAKE 1 TABLET BY MOUTH EVERY DAY IN THE EVENING 90 tablet 1   Menthol, Topical Analgesic, (ICY HOT PAIN RELIEVING EX) Apply topically daily as needed.     Multiple Vitamin (MULTIVITAMIN) tablet Take 1 tablet by mouth daily. Centrum silver - mens 50+     olmesartan (BENICAR) 40 MG tablet Take 1 tablet (40 mg total) by mouth daily. 90 tablet 0   carvedilol (COREG) 12.5 MG tablet TAKE 1 TABLET (12.5 MG TOTAL) BY MOUTH 2 (TWO) TIMES DAILY WITH A MEAL. 180 tablet 0   Fluticasone-Umeclidin-Vilant (TRELEGY ELLIPTA) 100-62.5-25 MCG/INH AEPB  Inhale 1 puff into the lungs daily. 332 each 1   folic acid (FOLVITE) 1 MG tablet Take 1 tablet (1 mg total) by mouth daily. 90 tablet 1   torsemide (DEMADEX) 20 MG tablet TAKE 1 TABLET BY MOUTH EVERY DAY 90 tablet 1   cyanocobalamin ((VITAMIN B-12)) injection 1,000 mcg      No facility-administered medications prior to visit.    ROS Review of Systems  Constitutional:  Positive for unexpected weight change. Negative for chills, diaphoresis and fatigue.  HENT: Negative.    Eyes: Negative.   Respiratory:  Positive for shortness of breath. Negative for cough, chest tightness and wheezing.   Cardiovascular:  Positive for leg swelling. Negative for chest pain and palpitations.  Gastrointestinal:  Negative for abdominal pain, constipation, diarrhea, nausea and vomiting.  Endocrine: Negative.   Genitourinary: Negative.  Negative for difficulty urinating, dysuria and hematuria.  Musculoskeletal:  Negative for arthralgias, back pain and myalgias.  Skin: Negative.  Negative for color change and pallor.  Neurological:  Negative for dizziness, speech difficulty, weakness, light-headedness, numbness and headaches.  Hematological:  Negative for adenopathy. Does not bruise/bleed easily.  Psychiatric/Behavioral: Negative.     Objective:  BP (!) 180/102 (BP Location: Left Arm, Patient Position: Sitting, Cuff Size: Large)   Pulse 64   Temp 98.5 F (36.9 C) (Oral)   Resp 16   Ht 5' 10"  (1.778 m)   Wt 257 lb (116.6 kg)  SpO2 96%   BMI 36.88 kg/m   BP Readings from Last 3 Encounters:  01/21/21 (!) 180/102  01/17/20 114/68  10/17/19 (!) 152/88    Wt Readings from Last 3 Encounters:  01/21/21 257 lb (116.6 kg)  01/17/20 255 lb (115.7 kg)  10/17/19 262 lb (118.8 kg)    Physical Exam Vitals reviewed.  Constitutional:      Appearance: He is obese. He is not ill-appearing.  HENT:     Nose: Nose normal.     Mouth/Throat:     Mouth: Mucous membranes are moist.  Eyes:      Conjunctiva/sclera: Conjunctivae normal.  Cardiovascular:     Rate and Rhythm: Normal rate and regular rhythm.     Heart sounds: No murmur heard.    Comments: EKG- SR with SA and 1st degree AV block PRWP has worsened No Q waves or LVH Pulmonary:     Effort: Pulmonary effort is normal.     Breath sounds: No stridor. No wheezing, rhonchi or rales.  Abdominal:     General: Abdomen is protuberant. Bowel sounds are normal. There is no distension.     Palpations: Abdomen is soft. There is no hepatomegaly, splenomegaly or mass.     Tenderness: There is no abdominal tenderness. There is no guarding.     Hernia: There is no hernia in the left inguinal area or right inguinal area.  Genitourinary:    Pubic Area: No rash.      Penis: Normal.      Testes: Normal.     Epididymis:     Right: Normal. Not inflamed or enlarged. No mass.     Left: Normal. Not inflamed or enlarged. No mass.     Prostate: Enlarged. Not tender and no nodules present.     Rectum: Normal. Guaiac result negative. No mass, tenderness, anal fissure, external hemorrhoid or internal hemorrhoid. Normal anal tone.  Musculoskeletal:        General: Normal range of motion.     Cervical back: Neck supple.     Right lower leg: Edema (trace pitting) present.     Left lower leg: Edema (trace pitting) present.  Lymphadenopathy:     Cervical: No cervical adenopathy.     Lower Body: No right inguinal adenopathy. No left inguinal adenopathy.  Skin:    General: Skin is warm and dry.  Neurological:     General: No focal deficit present.     Mental Status: He is alert.  Psychiatric:        Mood and Affect: Mood normal.        Behavior: Behavior normal.    Lab Results  Component Value Date   WBC 4.9 01/21/2021   HGB 16.5 01/21/2021   HCT 48.9 01/21/2021   PLT 172.0 01/21/2021   GLUCOSE 96 01/17/2020   CHOL 137 01/21/2021   TRIG 135.0 01/21/2021   HDL 45.10 01/21/2021   LDLDIRECT 65.0 10/17/2019   LDLCALC 65 01/21/2021    ALT 18 01/21/2021   AST 29 01/21/2021   NA 139 01/17/2020   K 4.4 01/17/2020   CL 103 01/17/2020   CREATININE 1.37 (H) 01/17/2020   BUN 26 (H) 01/17/2020   CO2 28 01/17/2020   TSH 5.91 (H) 01/21/2021   PSA 2.06 01/21/2021   INR 1.1 (H) 01/21/2021   HGBA1C 6.2 01/21/2021    DG Chest 2 View  Result Date: 06/10/2018 CLINICAL DATA:  Cough, congestion and shortness of breath EXAM: CHEST - 2 VIEW COMPARISON:  Chest x-ray 11/25/2017 and chest CT 03/24/2018 FINDINGS: The cardiac silhouette, mediastinal and hilar contours are within normal limits and stable. There is moderate tortuosity of the thoracic aorta. The right costophrenic angle is cut off the bottom edge of the film. No definite infiltrates, edema or pulmonary lesions. Stable left pleural thickening and evidence of remote left-sided thoracic trauma. The bony thorax is intact. Stable degenerative changes involving the thoracic spine. IMPRESSION: No acute cardiopulmonary findings. Remote posttraumatic changes involving the left hemithorax. Electronically Signed   By: Marijo Sanes M.D.   On: 06/10/2018 15:00    Assessment & Plan:   Reyansh was seen today for annual exam, hypertension and hyperlipidemia.  Diagnoses and all orders for this visit:  Essential hypertension- His blood pressure is not well controlled.  I have asked him to be more consistent with carvedilol.  Will increase the dose of the loop diuretic.  Will continue the current dose of the ARB. -     carvedilol (COREG) 12.5 MG tablet; Take 1 tablet (12.5 mg total) by mouth 2 (two) times daily with a meal. -     CBC with Differential/Platelet; Future -     EKG 12-Lead -     torsemide (DEMADEX) 20 MG tablet; Take 1 tablet (20 mg total) by mouth 2 (two) times daily. -     CBC with Differential/Platelet -     Basic metabolic panel; Future  NASH (nonalcoholic steatohepatitis)- His liver enzymes are normal now. -     Hepatic function panel; Future -     Protime-INR; Future -      Protime-INR -     Hepatic function panel  Hypothyroidism, unspecified type- He has subclinical hypothyroidism.  Thyroid replacement therapy is not indicated. -     TSH; Future -     TSH  Dietary folate deficiency anemia -     folic acid (FOLVITE) 1 MG tablet; Take 1 tablet (1 mg total) by mouth daily.  Dyslipidemia, goal LDL below 100- LDL goal achieved. Doing well on the statin  -     Lipid panel; Future -     TSH; Future -     TSH -     Lipid panel  Vitamin D deficiency disease -     VITAMIN D 25 Hydroxy (Vit-D Deficiency, Fractures); Future -     Urinalysis, Routine w reflex microscopic; Future -     Urinalysis, Routine w reflex microscopic -     VITAMIN D 25 Hydroxy (Vit-D Deficiency, Fractures)  Routine general medical examination at a health care facility- Exam completed, labs reviewed, vaccines reviewed and updated, cancer screenings are UTD, pt ed material was given.  Flu vaccine need -     Flu Vaccine QUAD High Dose(Fluad)  Prediabetes- His A1C is at 6.2%. medical therapy is not indicated. -     Hemoglobin A1c; Future -     Hemoglobin A1c -     Basic metabolic panel; Future  COPD GOLD 0  -     Fluticasone-Umeclidin-Vilant (TRELEGY ELLIPTA) 100-62.5-25 MCG/INH AEPB; Inhale 1 puff into the lungs daily.  CHF with left ventricular diastolic dysfunction, NYHA class 1 (HCC) -     carvedilol (COREG) 12.5 MG tablet; Take 1 tablet (12.5 mg total) by mouth 2 (two) times daily with a meal. -     torsemide (DEMADEX) 20 MG tablet; Take 1 tablet (20 mg total) by mouth 2 (two) times daily.  Benign prostatic hyperplasia without lower urinary tract symptoms- His PSA  is reassuring. -     PSA; Future -     Urinalysis, Routine w reflex microscopic; Future -     Urinalysis, Routine w reflex microscopic -     PSA  B12 deficiency -     Vitamin B12; Future -     Vitamin B12  DOE (dyspnea on exertion)- I have recommended that her undergo an MPI to screen for ischemia. -      MYOCARDIAL PERFUSION IMAGING; Future -     Troponin I (High Sensitivity); Future -     Brain natriuretic peptide; Future -     Brain natriuretic peptide -     Troponin I (High Sensitivity) -     Cardiac Stress Test: Informed Consent Details: Physician/Practitioner Attestation; Transcribe to consent form and obtain patient signature; Future  Abnormal electrocardiogram (ECG) (EKG) -     MYOCARDIAL PERFUSION IMAGING; Future -     Troponin I (High Sensitivity); Future -     Brain natriuretic peptide; Future -     Brain natriuretic peptide -     Troponin I (High Sensitivity) -     Cardiac Stress Test: Informed Consent Details: Physician/Practitioner Attestation; Transcribe to consent form and obtain patient signature; Future  Abnormal electrocardiogram -     MYOCARDIAL PERFUSION IMAGING; Future -     Troponin I (High Sensitivity); Future -     Brain natriuretic peptide; Future -     Brain natriuretic peptide -     Troponin I (High Sensitivity) -     Cardiac Stress Test: Informed Consent Details: Physician/Practitioner Attestation; Transcribe to consent form and obtain patient signature; Future  Encounter for general adult medical examination with abnormal findings  I have changed [place] torsemide. I am also having him maintain his Cholecalciferol, [place]-Con M20, levocetirizine, atorvastatin, (Menthol, Topical Analgesic, (ICY HOT PAIN RELIEVING EX)), multivitamin, albuterol, olmesartan, Trelegy Ellipta, carvedilol, and folic acid. We will stop administering cyanocobalamin.  Meds ordered this encounter  Medications   Fluticasone-Umeclidin-Vilant (TRELEGY ELLIPTA) 100-62.5-25 MCG/INH AEPB    Sig: Inhale 1 puff into the lungs daily.    Dispense:  120 each    Refill:  1   carvedilol (COREG) 12.5 MG tablet    Sig: Take 1 tablet (12.5 mg total) by mouth 2 (two) times daily with a meal.    Dispense:  180 tablet    Refill:  0   torsemide (DEMADEX) 20 MG tablet    Sig: Take 1 tablet  (20 mg total) by mouth 2 (two) times daily.    Dispense:  180 tablet    Refill:  0   folic acid (FOLVITE) 1 MG tablet    Sig: Take 1 tablet (1 mg total) by mouth daily.    Dispense:  90 tablet    Refill:  1      Follow-up: Return in about 4 weeks (around 02/18/2021).  Scarlette Calico, MD

## 2021-01-21 NOTE — Patient Instructions (Signed)
Health Maintenance, Male Adopting a healthy lifestyle and getting preventive care are important in promoting health and wellness. Ask your health care provider about: The right schedule for you to have regular tests and exams. Things you can do on your own to prevent diseases and keep yourself healthy. What should I know about diet, weight, and exercise? Eat a healthy diet  Eat a diet that includes plenty of vegetables, fruits, low-fat dairy products, and lean protein. Do not eat a lot of foods that are high in solid fats, added sugars, or sodium. Maintain a healthy weight Body mass index (BMI) is a measurement that can be used to identify possible weight problems. It estimates body fat based on height and weight. Your health care provider can help determine your BMI and help you achieve or maintain a healthy weight. Get regular exercise Get regular exercise. This is one of the most important things you can do for your health. Most adults should: Exercise for at least 150 minutes each week. The exercise should increase your heart rate and make you sweat (moderate-intensity exercise). Do strengthening exercises at least twice a week. This is in addition to the moderate-intensity exercise. Spend less time sitting. Even light physical activity can be beneficial. Watch cholesterol and blood lipids Have your blood tested for lipids and cholesterol at 74 years of age, then have this test every 5 years. You may need to have your cholesterol levels checked more often if: Your lipid or cholesterol levels are high. You are older than 74 years of age. You are at high risk for heart disease. What should I know about cancer screening? Many types of cancers can be detected early and may often be prevented. Depending on your health history and family history, you may need to have cancer screening at various ages. This may include screening for: Colorectal cancer. Prostate cancer. Skin cancer. Lung  cancer. What should I know about heart disease, diabetes, and high blood pressure? Blood pressure and heart disease High blood pressure causes heart disease and increases the risk of stroke. This is more likely to develop in people who have high blood pressure readings, are of African descent, or are overweight. Talk with your health care provider about your target blood pressure readings. Have your blood pressure checked: Every 3-5 years if you are 18-39 years of age. Every year if you are 40 years old or older. If you are between the ages of 65 and 75 and are a current or former smoker, ask your health care provider if you should have a one-time screening for abdominal aortic aneurysm (AAA). Diabetes Have regular diabetes screenings. This checks your fasting blood sugar level. Have the screening done: Once every three years after age 45 if you are at a normal weight and have a low risk for diabetes. More often and at a younger age if you are overweight or have a high risk for diabetes. What should I know about preventing infection? Hepatitis B If you have a higher risk for hepatitis B, you should be screened for this virus. Talk with your health care provider to find out if you are at risk for hepatitis B infection. Hepatitis C Blood testing is recommended for: Everyone born from 1945 through 1965. Anyone with known risk factors for hepatitis C. Sexually transmitted infections (STIs) You should be screened each year for STIs, including gonorrhea and chlamydia, if: You are sexually active and are younger than 74 years of age. You are older than 74 years   of age and your health care provider tells you that you are at risk for this type of infection. Your sexual activity has changed since you were last screened, and you are at increased risk for chlamydia or gonorrhea. Ask your health care provider if you are at risk. Ask your health care provider about whether you are at high risk for HIV.  Your health care provider may recommend a prescription medicine to help prevent HIV infection. If you choose to take medicine to prevent HIV, you should first get tested for HIV. You should then be tested every 3 months for as long as you are taking the medicine. Follow these instructions at home: Lifestyle Do not use any products that contain nicotine or tobacco, such as cigarettes, e-cigarettes, and chewing tobacco. If you need help quitting, ask your health care provider. Do not use street drugs. Do not share needles. Ask your health care provider for help if you need support or information about quitting drugs. Alcohol use Do not drink alcohol if your health care provider tells you not to drink. If you drink alcohol: Limit how much you have to 0-2 drinks a day. Be aware of how much alcohol is in your drink. In the [place] one drink equals one 12 oz bottle of beer (355 mL), one 5 oz glass of wine (148 mL), or one 1 oz glass of hard liquor (44 mL). General instructions Schedule regular health, dental, and eye exams. Stay current with your vaccines. Tell your health care provider if: You often feel depressed. You have ever been abused or do not feel safe at home. Summary Adopting a healthy lifestyle and getting preventive care are important in promoting health and wellness. Follow your health care provider's instructions about healthy diet, exercising, and getting tested or screened for diseases. Follow your health care provider's instructions on monitoring your cholesterol and blood pressure. This information is not intended to replace advice given to you by your health care provider. Make sure you discuss any questions you have with your health care provider. Document Revised: 07/05/2020 Document Reviewed: 04/20/2018 Elsevier Patient Education  2022 [employer] Inc[employer]  

## 2021-01-22 LAB — CBC WITH DIFFERENTIAL/PLATELET
Basophils Absolute: 0 10*3/uL (ref 0.0–0.1)
Basophils Relative: 1 % (ref 0.0–3.0)
Eosinophils Absolute: 0.6 10*3/uL (ref 0.0–0.7)
Eosinophils Relative: 12.1 % — ABNORMAL HIGH (ref 0.0–5.0)
HCT: 48.9 % (ref 39.0–52.0)
Hemoglobin: 16.5 g/dL (ref 13.0–17.0)
Lymphocytes Relative: 37.3 % (ref 12.0–46.0)
Lymphs Abs: 1.8 10*3/uL (ref 0.7–4.0)
MCHC: 33.7 g/dL (ref 30.0–36.0)
MCV: 88.6 fl (ref 78.0–100.0)
Monocytes Absolute: 0.4 10*3/uL (ref 0.1–1.0)
Monocytes Relative: 8.3 % (ref 3.0–12.0)
Neutro Abs: 2 10*3/uL (ref 1.4–7.7)
Neutrophils Relative %: 41.3 % — ABNORMAL LOW (ref 43.0–77.0)
Platelets: 172 10*3/uL (ref 150.0–400.0)
RBC: 5.52 Mil/uL (ref 4.22–5.81)
RDW: 15.5 % (ref 11.5–15.5)
WBC: 4.9 10*3/uL (ref 4.0–10.5)

## 2021-01-22 LAB — HEPATIC FUNCTION PANEL
ALT: 18 U/L (ref 0–53)
AST: 29 U/L (ref 0–37)
Albumin: 4.2 g/dL (ref 3.5–5.2)
Alkaline Phosphatase: 50 U/L (ref 39–117)
Bilirubin, Direct: 0.3 mg/dL (ref 0.0–0.3)
Total Bilirubin: 1.6 mg/dL — ABNORMAL HIGH (ref 0.2–1.2)
Total Protein: 7.4 g/dL (ref 6.0–8.3)

## 2021-01-22 LAB — LIPID PANEL
Cholesterol: 137 mg/dL (ref 0–200)
HDL: 45.1 mg/dL (ref >39.00)
LDL Cholesterol: 65 mg/dL (ref 0–99)
NonHDL: 92.24
Total CHOL/HDL Ratio: 3
Triglycerides: 135 mg/dL (ref 0.0–149.0)
VLDL: 27 mg/dL (ref 0.0–40.0)

## 2021-01-22 LAB — TSH: TSH: 5.91 u[IU]/mL — ABNORMAL HIGH (ref 0.35–5.50)

## 2021-01-22 LAB — URINALYSIS, ROUTINE W REFLEX MICROSCOPIC
Bilirubin Urine: NEGATIVE
Hgb urine dipstick: NEGATIVE
Ketones, ur: NEGATIVE
Leukocytes,Ua: NEGATIVE
Nitrite: NEGATIVE
RBC / HPF: NONE SEEN (ref 0–?)
Specific Gravity, Urine: 1.02 (ref 1.000–1.030)
Total Protein, Urine: 100 — AB
Urine Glucose: NEGATIVE
Urobilinogen, UA: 1 (ref 0.0–1.0)
pH: 7 (ref 5.0–8.0)

## 2021-01-22 LAB — VITAMIN B12: Vitamin B-12: 1170 pg/mL — ABNORMAL HIGH (ref 211–911)

## 2021-01-22 LAB — VITAMIN D 25 HYDROXY (VIT D DEFICIENCY, FRACTURES): VITD: 59.3 ng/mL (ref 30.00–100.00)

## 2021-01-22 LAB — TROPONIN I (HIGH SENSITIVITY): High Sens Troponin I: 7 ng/L (ref 2–17)

## 2021-01-22 LAB — BRAIN NATRIURETIC PEPTIDE: Pro B Natriuretic peptide (BNP): 70 pg/mL (ref 0.0–100.0)

## 2021-01-22 LAB — HEMOGLOBIN A1C: Hgb A1c MFr Bld: 6.2 % (ref 4.6–6.5)

## 2021-01-22 LAB — PSA: PSA: 2.06 ng/mL (ref 0.10–4.00)

## 2021-01-23 ENCOUNTER — Telehealth: Payer: Self-pay | Admitting: Internal Medicine

## 2021-01-23 ENCOUNTER — Other Ambulatory Visit: Payer: Medicare Other

## 2021-01-23 LAB — PROTIME-INR
INR: 1.1 ratio — ABNORMAL HIGH (ref 0.8–1.0)
Prothrombin Time: 11.9 s (ref 9.6–13.1)

## 2021-01-23 NOTE — Telephone Encounter (Signed)
I have spoke to the pt. I have informed him that I do not see an outgoing call to him per Epic. I mentioned that lab work done during his recent [place] on 9/13 was waiting for notations from PCP about those results and that I would follow up with him in regard. I suggested that he reach out to whomever called him about lab work to inquire for additional information due to no documentation in Epic stating that more lab work was needed.

## 2021-01-23 NOTE — Telephone Encounter (Signed)
   Patient calling to discuss lab results. Patient also states he received a call to re-do labs.  Please call patient

## 2021-01-25 DIAGNOSIS — R9431 Abnormal electrocardiogram [ECG] [EKG]: Secondary | ICD-10-CM | POA: Insufficient documentation

## 2021-01-25 DIAGNOSIS — Z0001 Encounter for general adult medical examination with abnormal findings: Secondary | ICD-10-CM | POA: Insufficient documentation

## 2021-01-30 NOTE — Telephone Encounter (Signed)
Patient called in to see if lab results were back. Results were given. Patient requesting what next steps would be or changes he needs to make?

## 2021-02-04 NOTE — Telephone Encounter (Signed)
Pt has been scheduled for 11/15 @ 2pm for 6-8wk f/u

## 2021-02-07 ENCOUNTER — Other Ambulatory Visit: Payer: Self-pay | Admitting: Internal Medicine

## 2021-02-07 DIAGNOSIS — I5030 Unspecified diastolic (congestive) heart failure: Secondary | ICD-10-CM

## 2021-02-07 DIAGNOSIS — I10 Essential (primary) hypertension: Secondary | ICD-10-CM

## 2021-02-14 ENCOUNTER — Telehealth: Payer: Self-pay

## 2021-02-14 NOTE — Progress Notes (Signed)
    Chronic Care Management Pharmacy Assistant   Name: Kurt Blair  MRN: [id number] DOB: Aug 07, 1945   Reason for Encounter: Disease State General assessment     Recent office visits:  01/21/21 Janith Lima MD - Seen for hypertension - Labs ordered -  Ekg ordered - MYOCARDIAL PERFUSION IMAGING - Stress test - increase the dose of the loop diuretic - Changed torsemide sig to Take 1 tablet (20 mg total) by mouth 2 (two) times daily - Follow up in 4 weeks    Recent consult visits:  None   Hospital visits:  None in previous 6 months  Medications: Outpatient Encounter Medications as of 02/14/2021  Medication Sig   albuterol (VENTOLIN HFA) 108 (90 Base) MCG/ACT inhaler Inhale 2 puffs into the lungs every 6 (six) hours as needed for wheezing or shortness of breath.   atorvastatin (LIPITOR) 80 MG tablet TAKE 1 TABLET BY MOUTH EVERY DAY   carvedilol (COREG) 12.5 MG tablet Take 1 tablet (12.5 mg total) by mouth 2 (two) times daily with a meal.   Cholecalciferol 50 MCG (2000 UT) TABS Take 1 tablet (2,000 Units total) by mouth daily.   Fluticasone-Umeclidin-Vilant (TRELEGY ELLIPTA) 100-62.5-25 MCG/INH AEPB Inhale 1 puff into the lungs daily.   folic acid (FOLVITE) 1 MG tablet Take 1 tablet (1 mg total) by mouth daily.   KLOR-CON M20 20 MEQ tablet TAKE 1 TABLET BY MOUTH TWICE A DAY   levocetirizine (XYZAL) 5 MG tablet TAKE 1 TABLET BY MOUTH EVERY DAY IN THE EVENING   Menthol, Topical Analgesic, (ICY HOT PAIN RELIEVING EX) Apply topically daily as needed.   Multiple Vitamin (MULTIVITAMIN) tablet Take 1 tablet by mouth daily. Centrum silver - mens 50+   olmesartan (BENICAR) 40 MG tablet Take 1 tablet (40 mg total) by mouth daily.   torsemide (DEMADEX) 20 MG tablet TAKE 1 TABLET BY MOUTH EVERY DAY   No facility-administered encounter medications on file as of 02/14/2021.    Care Gaps: Zoster Vaccines- Shingrix Overdue - never done COLONOSCOPY Last completed: Jul 15, 2015 COVID-19 Vaccine  Last completed: Jul 22, 2019  Have you had any problems recently with your health? Patient states that he has stopped taking his albuterol because he has noticed that his heart rate and blood pressure increased while taking the medication. He states that he has been doing well on his Trelegy and would like to try another medication to replace his albuterol.   Have you had any problems with your pharmacy? Patient states that he has no problems with his pharmacy.   What issues or side effects are you having with your medications? Patient states that he has no issues with medications currently.   What would you like me to pass along to [facility], CPP  for them to help you with?  Patient states no other issues besides the albuterol medication.   What can we do to take care of you better?  N/a  Star Rating Drugs: atorvastatin (LIPITOR) 80 MG tablet last fill 01/26/2021 90 DS olmesartan (BENICAR) 40 MG tablet 01/03/2021 90 DS torsemide (DEMADEX) 20 MG tablet 01/21/21 90 DS   Andee Poles, CMA

## 2021-02-14 NOTE — Telephone Encounter (Signed)
Spoke with patient,   Reports that recently he has used albuterol inhaler - but after using noticed heart was racing and BP elevated, - has subsided since discontinuing inhaler use  COPD (Goal: control symptoms and prevent exacerbations) -Controlled -Current treatment  Trelegy Ellipta 100-62.5-25mcg - 1 puff daily  Albuterol 13mg/act - 2 puffs every 6 hours as needed  -Medications previously tried: symbicort, advair, wixela,   -Gold Grade: 0 -Pulmonary function testing: 12/22/2017 - FEV1 2.08 (75%) -Exacerbations requiring treatment in last 6 months: 0 -Patient reports consistent use of maintenance inhaler -Frequency of rescue inhaler use: 1-2 times per month -Counseled on Proper inhaler technique; Benefits of consistent maintenance inhaler use When to use rescue inhaler Differences between maintenance and rescue inhalers -Recommended for patient to trial levalbuterol 454m/act - 2 puffs every 6 hours as needed - due to less cardiac activity from medication, patient should not experience increased HR/ BP from this - patient agreeable to plan, will reach out with any issues or concerns after starting   DaTomasa BlasePharmD Clinical Pharmacist, [place]

## 2021-02-15 ENCOUNTER — Other Ambulatory Visit: Payer: Self-pay | Admitting: Internal Medicine

## 2021-02-15 DIAGNOSIS — J449 Chronic obstructive pulmonary disease, unspecified: Secondary | ICD-10-CM

## 2021-02-15 MED ORDER — LEVALBUTEROL TARTRATE 45 MCG/ACT IN AERO: 1.0000 | INHALATION_SPRAY | Freq: Four times a day (QID) | RESPIRATORY_TRACT | 2 refills | Status: DC | PRN

## 2021-02-17 NOTE — Telephone Encounter (Signed)
Please advise as the pt has stated he seen his B12 was to high and wants to know what should he do going forward?  His next B12 injection as been canceled until further notice.

## 2021-02-19 ENCOUNTER — Ambulatory Visit: Payer: Medicare Other

## 2021-02-19 ENCOUNTER — Other Ambulatory Visit: Payer: Self-pay | Admitting: Internal Medicine

## 2021-02-24 ENCOUNTER — Other Ambulatory Visit: Payer: Self-pay | Admitting: Internal Medicine

## 2021-02-24 DIAGNOSIS — L240 Irritant contact dermatitis due to detergents: Secondary | ICD-10-CM

## 2021-03-25 ENCOUNTER — Encounter: Payer: Self-pay | Admitting: Internal Medicine

## 2021-03-25 ENCOUNTER — Ambulatory Visit (INDEPENDENT_AMBULATORY_CARE_PROVIDER_SITE_OTHER): Payer: Medicare Other | Admitting: Internal Medicine

## 2021-03-25 ENCOUNTER — Other Ambulatory Visit: Payer: Self-pay

## 2021-03-25 VITALS — BP 138/84 | HR 64 | Temp 97.6°F | Resp 16 | Ht 70.0 in | Wt 257.0 lb

## 2021-03-25 DIAGNOSIS — I5030 Unspecified diastolic (congestive) heart failure: Secondary | ICD-10-CM | POA: Diagnosis not present

## 2021-03-25 DIAGNOSIS — I10 Essential (primary) hypertension: Secondary | ICD-10-CM | POA: Diagnosis not present

## 2021-03-25 DIAGNOSIS — E039 Hypothyroidism, unspecified: Secondary | ICD-10-CM | POA: Diagnosis not present

## 2021-03-25 LAB — TSH: TSH: 4.5 u[IU]/mL (ref 0.35–5.50)

## 2021-03-25 NOTE — Patient Instructions (Signed)

## 2021-03-25 NOTE — Progress Notes (Signed)
Subjective:  Patient ID: Kurt Blair, male    DOB: [birth year]  Age: 75 y.o. MRN: [id number]  CC: Hypertension  This visit occurred during the SARS-CoV-2 public health emergency.  Safety protocols were in place, including screening questions prior to the visit, additional usage of staff PPE, and extensive cleaning of exam room while observing appropriate contact time as indicated for disinfecting solutions.    HPI Kurt Blair presents for f/up -   He tells me his blood pressure is much better and his lower extremity edema has diminished.  He is active and denies chest pain, shortness of breath, diaphoresis, weight gain, dizziness, or lightheadedness.  Outpatient Medications Prior to Visit  Medication Sig Dispense Refill   atorvastatin (LIPITOR) 80 MG tablet TAKE 1 TABLET BY MOUTH EVERY DAY 90 tablet 1   carvedilol (COREG) 12.5 MG tablet Take 1 tablet (12.5 mg total) by mouth 2 (two) times daily with a meal. 180 tablet 0   Cholecalciferol 50 MCG (2000 UT) TABS Take 1 tablet (2,000 Units total) by mouth daily. 90 tablet 1   Fluticasone-Umeclidin-Vilant (TRELEGY ELLIPTA) 100-62.5-25 MCG/INH AEPB Inhale 1 puff into the lungs daily. 518 each 1   folic acid (FOLVITE) 1 MG tablet Take 1 tablet (1 mg total) by mouth daily. 90 tablet 1   KLOR-CON M20 20 MEQ tablet TAKE 1 TABLET BY MOUTH TWICE A DAY 180 tablet 1   levalbuterol (XOPENEX HFA) 45 MCG/ACT inhaler Inhale 1 puff into the lungs every 6 (six) hours as needed for wheezing. 45 g 2   levocetirizine (XYZAL) 5 MG tablet TAKE 1 TABLET BY MOUTH EVERY DAY IN THE EVENING 90 tablet 1   Menthol, Topical Analgesic, (ICY HOT PAIN RELIEVING EX) Apply topically daily as needed.     Multiple Vitamin (MULTIVITAMIN) tablet Take 1 tablet by mouth daily. Centrum silver - mens 50+     olmesartan (BENICAR) 40 MG tablet Take 1 tablet (40 mg total) by mouth daily. 90 tablet 0   torsemide (DEMADEX) 20 MG tablet TAKE 1 TABLET BY MOUTH EVERY DAY 90 tablet 1    No facility-administered medications prior to visit.    ROS Review of Systems  Constitutional:  Negative for diaphoresis, fatigue and unexpected weight change.  HENT: Negative.    Eyes: Negative.   Respiratory:  Negative for cough, chest tightness, shortness of breath and wheezing.   Cardiovascular:  Positive for leg swelling. Negative for chest pain and palpitations.  Gastrointestinal:  Negative for abdominal pain, diarrhea and nausea.  Endocrine: Negative.   Genitourinary: Negative.  Negative for difficulty urinating.  Musculoskeletal: Negative.   Skin: Negative.   Neurological:  Negative for dizziness, weakness and light-headedness.  Hematological:  Negative for adenopathy. Does not bruise/bleed easily.  Psychiatric/Behavioral: Negative.     Objective:  BP 138/84 (BP Location: Left Arm, Patient Position: Sitting, Cuff Size: Large)   Pulse 64   Temp 97.6 F (36.4 C) (Oral)   Resp 16   Ht 5' 10"  (1.778 m)   Wt 257 lb (116.6 kg)   SpO2 91%   BMI 36.88 kg/m   BP Readings from Last 3 Encounters:  03/25/21 138/84  01/21/21 (!) 180/102  01/17/20 114/68    Wt Readings from Last 3 Encounters:  03/25/21 257 lb (116.6 kg)  01/21/21 257 lb (116.6 kg)  01/17/20 255 lb (115.7 kg)    Physical Exam Vitals reviewed.  HENT:     Mouth/Throat:     Mouth: Mucous membranes are moist.  Eyes:     General: No scleral icterus.    Conjunctiva/sclera: Conjunctivae normal.  Cardiovascular:     Rate and Rhythm: Normal rate and regular rhythm.     Heart sounds: No murmur heard. Pulmonary:     Breath sounds: No wheezing, rhonchi or rales.  Abdominal:     General: Abdomen is protuberant. Bowel sounds are normal. There is no distension.     Palpations: Abdomen is soft. There is no hepatomegaly, splenomegaly or mass.  Musculoskeletal:        General: Normal range of motion.     Cervical back: Neck supple.     Right lower leg: 1+ Pitting Edema present.     Left lower leg: 1+ Pitting  Edema present.  Lymphadenopathy:     Cervical: No cervical adenopathy.  Skin:    General: Skin is warm and dry.  Neurological:     General: No focal deficit present.     Mental Status: He is alert.  Psychiatric:        Mood and Affect: Mood normal.        Behavior: Behavior normal.    Lab Results  Component Value Date   WBC 4.9 01/21/2021   HGB 16.5 01/21/2021   HCT 48.9 01/21/2021   PLT 172.0 01/21/2021   GLUCOSE 96 01/17/2020   CHOL 137 01/21/2021   TRIG 135.0 01/21/2021   HDL 45.10 01/21/2021   LDLDIRECT 65.0 10/17/2019   LDLCALC 65 01/21/2021   ALT 18 01/21/2021   AST 29 01/21/2021   NA 139 01/17/2020   K 4.4 01/17/2020   CL 103 01/17/2020   CREATININE 1.37 (H) 01/17/2020   BUN 26 (H) 01/17/2020   CO2 28 01/17/2020   TSH 4.50 03/25/2021   PSA 2.06 01/21/2021   INR 1.1 (H) 01/21/2021   HGBA1C 6.2 01/21/2021    DG Chest 2 View  Result Date: 06/10/2018 CLINICAL DATA:  Cough, congestion and shortness of breath EXAM: CHEST - 2 VIEW COMPARISON:  Chest x-ray 11/25/2017 and chest CT 03/24/2018 FINDINGS: The cardiac silhouette, mediastinal and hilar contours are within normal limits and stable. There is moderate tortuosity of the thoracic aorta. The right costophrenic angle is cut off the bottom edge of the film. No definite infiltrates, edema or pulmonary lesions. Stable left pleural thickening and evidence of remote left-sided thoracic trauma. The bony thorax is intact. Stable degenerative changes involving the thoracic spine. IMPRESSION: No acute cardiopulmonary findings. Remote posttraumatic changes involving the left hemithorax. Electronically Signed   By: Marijo Sanes M.D.   On: 06/10/2018 15:00    Assessment & Plan:   Kurt Blair was seen today for hypertension.  Diagnoses and all orders for this visit:  CHF with left ventricular diastolic dysfunction, NYHA class 1 ([place])- He has persistent lower extremity edema so I have asked him to increase the torsemide dose to  twice daily. -     torsemide (DEMADEX) 20 MG tablet; Take 1 tablet (20 mg total) by mouth 2 (two) times daily.  Hypothyroidism, unspecified type- His TSH is in the normal range and he is euthyroid.  Thyroid replacement therapy is not indicated. -     TSH; Future -     TSH  Essential hypertension- He has not achieved his blood pressure goal of 130/80.  Will increase the dose of the loop diuretic. -     torsemide (DEMADEX) 20 MG tablet; Take 1 tablet (20 mg total) by mouth 2 (two) times daily.  I have changed Kurt Blair.  Kurt Blair's torsemide. I am also having him maintain his Cholecalciferol, atorvastatin, (Menthol, Topical Analgesic, (ICY HOT PAIN RELIEVING EX)), multivitamin, olmesartan, Trelegy Ellipta, carvedilol, folic acid, levalbuterol, [place]-Con M20, and levocetirizine.  Meds ordered this encounter  Medications   torsemide (DEMADEX) 20 MG tablet    Sig: Take 1 tablet (20 mg total) by mouth 2 (two) times daily.    Dispense:  180 tablet    Refill:  1      Follow-up: Return in about 3 months (around 06/25/2021).  Scarlette Calico, MD

## 2021-03-26 MED ORDER — TORSEMIDE 20 MG PO TABS: 20.0000 mg | ORAL_TABLET | Freq: Two times a day (BID) | ORAL | 1 refills | Status: DC

## 2021-04-02 ENCOUNTER — Other Ambulatory Visit: Payer: Self-pay | Admitting: Internal Medicine

## 2021-04-02 DIAGNOSIS — I10 Essential (primary) hypertension: Secondary | ICD-10-CM

## 2021-04-02 DIAGNOSIS — I5030 Unspecified diastolic (congestive) heart failure: Secondary | ICD-10-CM

## 2021-04-16 ENCOUNTER — Telehealth: Payer: Self-pay

## 2021-04-16 NOTE — Chronic Care Management (AMB) (Signed)
Patient reports that he has been having right knee pains   Has been taking APAP 64m prn - taking maybe 1-2 times weekly asking if it is ok for him to be taking / how much is he able to take  Advised patient that he can continue using APAP - advised to use no more than 30029mdaily   Patient voiced understanding of plan, will reach out with any issues or concerns   DaTomasa BlasePharmD Clinical Pharmacist, [place]

## 2021-04-23 ENCOUNTER — Other Ambulatory Visit: Payer: Self-pay | Admitting: Internal Medicine

## 2021-04-23 DIAGNOSIS — E785 Hyperlipidemia, unspecified: Secondary | ICD-10-CM

## 2021-04-28 ENCOUNTER — Other Ambulatory Visit: Payer: Self-pay | Admitting: Internal Medicine

## 2021-04-28 DIAGNOSIS — I10 Essential (primary) hypertension: Secondary | ICD-10-CM

## 2021-04-28 DIAGNOSIS — I5030 Unspecified diastolic (congestive) heart failure: Secondary | ICD-10-CM

## 2021-06-11 ENCOUNTER — Telehealth: Payer: Medicare Other

## 2021-06-16 ENCOUNTER — Telehealth: Payer: Medicare Other

## 2021-06-29 ENCOUNTER — Other Ambulatory Visit: Payer: Self-pay | Admitting: Internal Medicine

## 2021-06-29 DIAGNOSIS — I10 Essential (primary) hypertension: Secondary | ICD-10-CM

## 2021-06-29 DIAGNOSIS — I5030 Unspecified diastolic (congestive) heart failure: Secondary | ICD-10-CM

## 2021-07-25 ENCOUNTER — Telehealth: Payer: Self-pay | Admitting: Internal Medicine

## 2021-07-25 ENCOUNTER — Other Ambulatory Visit: Payer: Self-pay | Admitting: Internal Medicine

## 2021-07-25 DIAGNOSIS — D520 Dietary folate deficiency anemia: Secondary | ICD-10-CM

## 2021-07-25 NOTE — Telephone Encounter (Signed)
1.Medication Requested: torsemide (DEMADEX) 20 MG tablet ? ?2. Pharmacy (Name, [address], [place]): [id number] [place]? ?3. On Med List: Y ? ?4. Last Visit with PCP: 08-23-2020 ? ?5. Next visit date with PCP: n/a ? ? ?Agent: Please be advised that RX refills may take up to 3 business days. We ask that you follow-up with your pharmacy.  ?

## 2021-07-26 ENCOUNTER — Other Ambulatory Visit: Payer: Self-pay | Admitting: Internal Medicine

## 2021-07-26 DIAGNOSIS — I5030 Unspecified diastolic (congestive) heart failure: Secondary | ICD-10-CM

## 2021-07-26 DIAGNOSIS — I10 Essential (primary) hypertension: Secondary | ICD-10-CM

## 2021-07-28 ENCOUNTER — Other Ambulatory Visit: Payer: Self-pay | Admitting: Internal Medicine

## 2021-07-28 DIAGNOSIS — I5030 Unspecified diastolic (congestive) heart failure: Secondary | ICD-10-CM

## 2021-07-28 DIAGNOSIS — I10 Essential (primary) hypertension: Secondary | ICD-10-CM

## 2021-07-28 MED ORDER — TORSEMIDE 20 MG PO TABS: 20.0000 mg | ORAL_TABLET | Freq: Two times a day (BID) | ORAL | 0 refills | Status: DC

## 2021-08-20 ENCOUNTER — Other Ambulatory Visit: Payer: Self-pay | Admitting: Internal Medicine

## 2021-08-20 DIAGNOSIS — L240 Irritant contact dermatitis due to detergents: Secondary | ICD-10-CM

## 2021-08-22 ENCOUNTER — Ambulatory Visit (INDEPENDENT_AMBULATORY_CARE_PROVIDER_SITE_OTHER): Payer: Medicare Other

## 2021-08-22 DIAGNOSIS — J449 Chronic obstructive pulmonary disease, unspecified: Secondary | ICD-10-CM

## 2021-08-22 DIAGNOSIS — E785 Hyperlipidemia, unspecified: Secondary | ICD-10-CM

## 2021-08-22 DIAGNOSIS — I10 Essential (primary) hypertension: Secondary | ICD-10-CM

## 2021-08-22 DIAGNOSIS — I5030 Unspecified diastolic (congestive) heart failure: Secondary | ICD-10-CM

## 2021-08-22 NOTE — Progress Notes (Signed)
? ?Chronic Care Management ?Pharmacy Note ? ?08/22/2021 ?Name:  Kurt Blair MRN:  [id number] DOB:  1945/10/06 ? ?Summary: ?-Patient reports that he has increased torsemide dosing as directed - denies any issues since increase  ?-Denies any issues with swelling since increase - reports that he has not been monitoring BP recently  ?-Notes that breathing remains well controlled, has not been using a rescue inhaler as both albuterol and levalbuterol cause his heart to race - using trelegy intermittently due to cost - but breathing is well controlled when using - no need for rescue inhaler if using  ? ?Recommendations/Changes made from today's visit: ?-Discussed patient assistance for trelegy - [employer] requires $600 OOP expenses before qualifying for program -patient would not qualify for trelegy PAP ?- patient would qualify for Brez[employer] through [employer], patient assistance application mailed to patient  ?-Patient agreeable to start checking BP 1-2 times weekly, to reach out to office should BP average >140/90 ? ?Subjective: ?Kurt Blair is an 76 y.o. year old male who is a primary patient of Janith Lima, MD.  The CCM team was consulted for assistance with disease management and care coordination needs.   ? ?Engaged with patient by telephone for follow up visit in response to provider referral for pharmacy case management and/or care coordination services.  ? ?Consent to Services:  ?The patient was given the following information about Chronic Care Management services today, agreed to services, and gave verbal consent: 1. CCM service includes personalized support from designated clinical staff supervised by the primary care provider, including individualized plan of care and coordination with other care providers 2. 24/7 contact phone numbers for assistance for urgent and routine care needs. 3. Service will only be billed when office clinical staff spend 20 minutes or more in a month to coordinate care. 4. Only one  practitioner may furnish and bill the service in a calendar month. 5.The patient may stop CCM services at any time (effective at the end of the month) by phone call to the office staff. 6. The patient will be responsible for cost sharing (co-pay) of up to 20% of the service fee (after annual deductible is met). Patient agreed to services and consent obtained. ? ?Patient Care Team: ?Janith Lima, MD as PCP - General (Internal Medicine) ?Tomasa Blase, [facility] as Pharmacist (Pharmacist) ? ?Recent office visits:  ?03/25/2021 - Dr. Ronnald Ramp - increase torsemide to 80m BID  - f/u in 3 months  ?  ?Recent consult visits:  ?No visits in previous 6 months  ?  ?Hospital visits:  ?None in previous 6 months ? ?Objective: ? ?Lab Results  ?Component Value Date  ? CREATININE 1.37 (H) 01/17/2020  ? BUN 26 (H) 01/17/2020  ? GFR 85.56 10/17/2019  ? GFRNONAA 51 (L) 01/17/2020  ? GFRAA 59 (L) 01/17/2020  ? NA 139 01/17/2020  ? K 4.4 01/17/2020  ? CALCIUM 9.0 01/17/2020  ? CO2 28 01/17/2020  ? GLUCOSE 96 01/17/2020  ? ? ?Lab Results  ?Component Value Date/Time  ? HGBA1C 6.2 01/21/2021 04:25 PM  ? HGBA1C 6.4 10/17/2019 02:31 PM  ? GFR 85.56 10/17/2019 02:31 PM  ? GFR 74.09 09/06/2018 03:03 PM  ?  ?Last diabetic Eye exam:  ?No results found for: HMDIABEYEEXA  ?Last diabetic Foot exam:  ?No results found for: HMDIABFOOTEX  ? ?Lab Results  ?Component Value Date  ? CHOL 137 01/21/2021  ? HDL 45.10 01/21/2021  ? LAlorton65 01/21/2021  ? LDLDIRECT 65.0  10/17/2019  ? TRIG 135.0 01/21/2021  ? CHOLHDL 3 01/21/2021  ? ? ? ?  Latest Ref Rng & Units 01/21/2021  ?  4:25 PM 10/17/2019  ?  2:31 PM 06/29/2018  ?  3:22 PM  ?Hepatic Function  ?Total Protein 6.0 - 8.3 g/dL 7.4   7.0   7.9    ?Albumin 3.5 - 5.2 g/dL 4.2   4.2   4.2    ?AST 0 - 37 U/L 29   26   30     ?ALT 0 - 53 U/L 18   18   21     ?Alk Phosphatase 39 - 117 U/L 50   45   55    ?Total Bilirubin 0.2 - 1.2 mg/dL 1.6   1.0   1.6    ?Bilirubin, Direct 0.0 - 0.3 mg/dL 0.3   0.2     ? ? ?Lab Results   ?Component Value Date/Time  ? TSH 4.50 03/25/2021 03:06 PM  ? TSH 5.91 (H) 01/21/2021 04:25 PM  ? ? ? ?  Latest Ref Rng & Units 01/21/2021  ?  4:25 PM 01/17/2020  ?  3:36 PM 11/01/2019  ?  2:35 PM  ?CBC  ?WBC 4.0 - 10.5 K/uL 4.9   4.5   6.3    ?Hemoglobin 13.0 - 17.0 g/dL 16.5   11.6   13.4    ?Hematocrit 39.0 - 52.0 % 48.9   34.3   37.7    ?Platelets 150.0 - 400.0 K/uL 172.0   191   193.0    ? ? ?Lab Results  ?Component Value Date/Time  ? VD25OH 59.30 01/21/2021 04:25 PM  ? VD25OH 20.81 (L) 10/17/2019 02:31 PM  ? ? ?Clinical ASCVD: No  ?The 10-year ASCVD risk score (Arnett DK, et al., 2019) is: 23.3% ?  Values used to calculate the score: ?    Age: 70 years ?    Sex: Male ?    Is Non-Hispanic African American: Yes ?    Diabetic: No ?    Tobacco smoker: No ?    Systolic Blood Pressure: 154 mmHg ?    Is BP treated: Yes ?    HDL Cholesterol: 45.1 mg/dL ?    Total Cholesterol: 137 mg/dL   ? ? ?  10/17/2019  ?  1:38 PM 08/04/2016  ?  1:59 PM 06/04/2016  ? 11:36 AM  ?Depression screen PHQ 2/9  ?Decreased Interest 0 0 0  ?Down, Depressed, Hopeless 0 0 0  ?PHQ - 2 Score 0 0 0  ?Altered sleeping 0    ?Tired, decreased energy 0    ?Change in appetite 0    ?Feeling bad or failure about yourself  0    ?Trouble concentrating 0    ?Moving slowly or fidgety/restless 0    ?Suicidal thoughts 0    ?PHQ-9 Score 0    ?Difficult doing work/chores Not difficult at all    ?  ?Social History  ? ?Tobacco Use  ?Smoking Status Former  ? Packs/day: 0.50  ? Years: 50.00  ? Pack years: 25.00  ? Types: Cigarettes  ? Quit date: 05/11/2013  ? Years since quitting: 8.2  ?Smokeless Tobacco Never  ? ?BP Readings from Last 3 Encounters:  ?03/25/21 138/84  ?01/21/21 (!) 180/102  ?01/17/20 114/68  ? ?Pulse Readings from Last 3 Encounters:  ?03/25/21 64  ?01/21/21 64  ?01/17/20 62  ? ?Wt Readings from Last 3 Encounters:  ?03/25/21 257 lb (116.6 kg)  ?01/21/21 257 lb (116.6  kg)  ?01/17/20 255 lb (115.7 kg)  ? ?BMI Readings from Last 3 Encounters:  ?03/25/21 36.88  kg/m?  ?01/21/21 36.88 kg/m?  ?01/17/20 36.59 kg/m?  ? ? ?Assessment/Interventions: Review of patient past medical history, allergies, medications, health status, including review of consultants reports, laboratory and other test data, was performed as part of comprehensive evaluation and provision of chronic care management services.  ? ?SDOH:  (Social Determinants of Health) assessments and interventions performed: Yes ? ?SDOH Screenings  ? ?Alcohol Screen: Not on file  ?Depression (PHQ2-9): Not on file  ?Financial Resource Strain: Low Risk   ? Difficulty of Paying Living Expenses: Not hard at all  ?Food Insecurity: Not on file  ?Housing: Not on file  ?Physical Activity: Not on file  ?Social Connections: Not on file  ?Stress: Not on file  ?Tobacco Use: Medium Risk  ? Smoking Tobacco Use: Former  ? Smokeless Tobacco Use: Never  ? Passive Exposure: Not on file  ?Transportation Needs: Not on file  ? ? ?[place] ? ?Allergies  ?Allergen Reactions  ? Latex Hives and Rash  ? ? ?Medications Reviewed Today   ? ? Reviewed by Janith Lima, MD (Physician) on 03/25/21 at 1455  Med List Status: <None>  ? ?Medication Order Taking? Sig Documenting Provider Last Dose Status Informant  ?atorvastatin (LIPITOR) 80 MG tablet [id number] Yes TAKE 1 TABLET BY MOUTH EVERY DAY Janith Lima, MD Taking Active   ?carvedilol (COREG) 12.5 MG tablet [id number] Yes Take 1 tablet (12.5 mg total) by mouth 2 (two) times daily with a meal. Janith Lima, MD Taking Active   ?Cholecalciferol 50 MCG (2000 UT) TABS [id number] Yes Take 1 tablet (2,000 Units total) by mouth daily. Janith Lima, MD Taking Active   ?Fluticasone-Umeclidin-Vilant (TRELEGY ELLIPTA) 100-62.5-25 MCG/INH AEPB [id number] Yes Inhale 1 puff into the lungs daily. Janith Lima, MD Taking Active   ?folic acid (FOLVITE) 1 MG tablet [id number] Yes Take 1 tablet (1 mg total) by mouth daily. Janith Lima, MD Taking Active   ?KLOR-CON M20 20 MEQ tablet [id number] Yes TAKE 1  TABLET BY MOUTH TWICE A DAY Janith Lima, MD Taking Active   ?levalbuterol [facility] HFA) 45 MCG/ACT inhaler [id number] Yes Inhale 1 puff into the lungs every 6 (six) hours as needed for wheezing. Ronnald Ramp,

## 2021-08-22 NOTE — Patient Instructions (Signed)
Visit Information ? ?Following are the goals we discussed today:  ? ?Track and Manage My Blood Pressures  ? ?Timeframe:  Long-Range Goal ?Priority:  High ?Start Date:   12/10/2020                          ?Expected End Date: 08/22/2021                    ? ?Follow Up Date 02/2022 ?  ?- check blood pressure at least once weekly ?- choose a place to take my blood pressure (home, clinic or office, retail store) ?- write blood pressure results in a log or diary  ?  ?Why is this important?   ?You won't feel high blood pressure, but it can still hurt your blood vessels.  ?High blood pressure can cause heart or kidney problems. It can also cause a stroke.  ?Making lifestyle changes like losing a little weight or eating less salt will help.  ?Checking your blood pressure at home and at different times of the day can help to control blood pressure.  ?If the doctor prescribes medicine remember to take it the way the doctor ordered.  ?Call the office if you cannot afford the medicine or if there are questions about it. ? ?Plan: Telephone follow up appointment with care management team member scheduled for:  3 months ?The patient has been provided with contact information for the care management team and has been advised to call with any health related questions or concerns.  ? ?Tomasa Blase, PharmD ?Clinical Pharmacist, [place]  ? ?Please call the care guide team at [phone] if you need to cancel or reschedule your appointment.  ? ?Patient verbalizes understanding of instructions and care plan provided today and agrees to view in [place]. Active MyChart status confirmed with patient.   ? ?

## 2021-09-04 ENCOUNTER — Telehealth: Payer: Self-pay

## 2021-09-04 NOTE — Progress Notes (Signed)
? ? ?  Chronic Care Management ?Pharmacy Assistant  ? ?Name: [name]  MRN: [id number] DOB: 19-Aug-1945 ? ? ?Contacted  [employer]   to follow up on patient assistance application for [employer]. Per representative at  [employer]  states patient has been approved starting 09/03/21 through 05/10/22. Patient needs to call after November to remain enrollment.  ? ?Ethelene Hal ?Clinical Pharmacist Assistant ?[phone]    ?

## 2021-09-07 DIAGNOSIS — J449 Chronic obstructive pulmonary disease, unspecified: Secondary | ICD-10-CM | POA: Diagnosis not present

## 2021-09-07 DIAGNOSIS — E785 Hyperlipidemia, unspecified: Secondary | ICD-10-CM

## 2021-09-07 DIAGNOSIS — I10 Essential (primary) hypertension: Secondary | ICD-10-CM

## 2021-09-07 DIAGNOSIS — I5030 Unspecified diastolic (congestive) heart failure: Secondary | ICD-10-CM | POA: Diagnosis not present

## 2021-09-26 ENCOUNTER — Other Ambulatory Visit: Payer: Self-pay | Admitting: Internal Medicine

## 2021-09-26 DIAGNOSIS — I10 Essential (primary) hypertension: Secondary | ICD-10-CM

## 2021-09-26 DIAGNOSIS — I5030 Unspecified diastolic (congestive) heart failure: Secondary | ICD-10-CM

## 2021-10-15 ENCOUNTER — Ambulatory Visit (INDEPENDENT_AMBULATORY_CARE_PROVIDER_SITE_OTHER): Payer: Medicare Other

## 2021-10-15 DIAGNOSIS — Z0000 Encounter for general adult medical examination without abnormal findings: Secondary | ICD-10-CM

## 2021-10-15 NOTE — Progress Notes (Signed)
I connected with Kurt Blair today by telephone and verified that I am speaking with the correct person using two identifiers. Location patient: home Location provider: work Persons participating in the virtual visit: patient, provider.   I discussed the limitations, risks, security and privacy concerns of performing an evaluation and management service by telephone and the availability of in person appointments. I also discussed with the patient that there may be a patient responsible charge related to this service. The patient expressed understanding and verbally consented to this telephonic visit.    Interactive audio and video telecommunications were attempted between this provider and patient, however failed, due to patient having technical difficulties OR patient did not have access to video capability.  We continued and completed visit with audio only.  Some vital signs may be absent or patient reported.   Time Spent with patient on telephone encounter: 30 minutes  Subjective:   Kurt Blair is a 76 y.o. male who presents for Medicare Annual/Subsequent preventive examination.  Review of Systems     Cardiac Risk Factors include: advanced age (>63mn, >>28women);dyslipidemia;hypertension;male gender     Objective:    There were no vitals filed for this visit. There is no height or weight on file to calculate BMI.     10/15/2021    4:12 PM 09/25/2017    4:02 PM 08/04/2016    2:00 PM 06/04/2016   11:36 AM 02/21/2016    2:48 PM 11/13/2014    8:30 AM  Advanced Directives  Does Patient Have a Medical Advance Directive? No No No No No No  Would patient like information on creating a medical advance directive? No - Patient declined No - Patient declined        Current Medications (verified) Outpatient Encounter Medications as of 10/15/2021  Medication Sig   acetaminophen (TYLENOL) 650 MG CR tablet Take 650 mg by mouth every 8 (eight) hours as needed.   atorvastatin (LIPITOR) 80  MG tablet TAKE 1 TABLET BY MOUTH EVERY DAY   carvedilol (COREG) 12.5 MG tablet TAKE 1 TABLET (12.5MG TOTAL) BY MOUTH TWICE A DAY WITH MEALS   Cholecalciferol 50 MCG (2000 UT) TABS Take 1 tablet (2,000 Units total) by mouth daily.   Fluticasone-Umeclidin-Vilant (TRELEGY ELLIPTA) 100-62.5-25 MCG/INH AEPB Inhale 1 puff into the lungs daily.   folic acid (FOLVITE) 1 MG tablet TAKE 1 TABLET BY MOUTH EVERY DAY   KLOR-CON M20 20 MEQ tablet TAKE 1 TABLET BY MOUTH TWICE A DAY   levalbuterol (XOPENEX HFA) 45 MCG/ACT inhaler Inhale 1 puff into the lungs every 6 (six) hours as needed for wheezing. (Patient not taking: Reported on 08/22/2021)   levocetirizine (XYZAL) 5 MG tablet TAKE 1 TABLET BY MOUTH EVERY DAY IN THE EVENING   Menthol, Topical Analgesic, (ICY HOT PAIN RELIEVING EX) Apply topically daily as needed.   Multiple Vitamin (MULTIVITAMIN) tablet Take 1 tablet by mouth daily. Centrum silver - mens 50+   olmesartan (BENICAR) 40 MG tablet TAKE 1 TABLET BY MOUTH EVERY DAY   torsemide (DEMADEX) 20 MG tablet Take 1 tablet (20 mg total) by mouth 2 (two) times daily.   No facility-administered encounter medications on file as of 10/15/2021.    Allergies (verified) Latex   History: Past Medical History:  Diagnosis Date   ANXIETY 02/16/2007   DEPRESSION 02/16/2007   Diverticulosis    ERECTILE DYSFUNCTION 02/16/2007   FATTY LIVER DISEASE 06/07/2008   HYPERGLYCEMIA 02/16/2007   Hyperlipidemia    Internal hemorrhoid  NASH (nonalcoholic steatohepatitis)    PULMONARY EMBOLISM, HX OF 02/16/2007   SUBACROMIAL BURSITIS, RIGHT 06/07/2008   Tubular adenoma of colon 07/2001   VENOUS INSUFFICIENCY 02/16/2007   Past Surgical History:  Procedure Laterality Date   COLONOSCOPY     Drainage fluid from chest  04-Aug-1988   Leg stripping  Aug 04, 2001   Left    Family History  Problem Relation Age of Onset   Cancer Mother        uncertain type   Cancer Sister        Lung Disease   Colon cancer Neg Hx    Esophageal cancer  Neg Hx    Rectal cancer Neg Hx    Stomach cancer Neg Hx    Prostate cancer Neg Hx    Pancreatic cancer Neg Hx    Social History   Socioeconomic History   Marital status: Married    Spouse name: Not on file   Number of children: Not on file   Years of education: Not on file   Highest education level: Not on file  Occupational History   Occupation: Landscaping    Comment: retired  Tobacco Use   Smoking status: Former    Packs/day: 0.50    Years: 50.00    Pack years: 25.00    Types: Cigarettes    Quit date: 05/11/2013    Years since quitting: 8.4   Smokeless tobacco: Never  Vaping Use   Vaping Use: Never used  Substance and Sexual Activity   Alcohol use: Yes    Alcohol/week: 14.0 standard drinks    Types: 14 Shots of liquor per week    Comment: every night 6oz brandy   Drug use: No   Sexual activity: Not on file  Other Topics Concern   Not on file  Social History Narrative   [name] (who pt and wife raised) died of cancer 2009-08-04   Social Determinants of Health   Financial Resource Strain: Low Risk    Difficulty of Paying Living Expenses: Not hard at all  Food Insecurity: No Food Insecurity   Worried About Charity fundraiser in the Last Year: Never true   Arboriculturist in the Last Year: Never true  Transportation Needs: No Transportation Needs   Lack of Transportation (Medical): No   Lack of Transportation (Non-Medical): No  Physical Activity: Sufficiently Active   Days of Exercise per Week: 5 days   Minutes of Exercise per Session: 30 min  Stress: No Stress Concern Present   Feeling of Stress : Not at all  Social Connections: Socially Integrated   Frequency of Communication with Friends and Family: More than three times a week   Frequency of Social Gatherings with Friends and Family: More than three times a week   Attends Religious Services: More than 4 times per year   Active Member of [employer] or Organizations: Yes   Attends Music therapist: More  than 4 times per year   Marital Status: Married    Tobacco Counseling Counseling given: Not Answered   Clinical Intake:  Pre-visit preparation completed: Yes  Pain : No/denies pain     Nutritional Risks: None Diabetes: No  How often do you need to have someone help you when you read instructions, pamphlets, or other written materials from your doctor or pharmacy?: 1 - Never What is the last grade level you completed in school?: GED  Diabetic? no  Interpreter Needed?: No  Information entered by :: Usher Hedberg N.  Elky Funches, LPN.   Activities of Daily Living    10/15/2021    4:29 PM  In your present state of health, do you have any difficulty performing the following activities:  Hearing? 0  Vision? 0  Difficulty concentrating or making decisions? 0  Walking or climbing stairs? 0  Dressing or bathing? 0  Doing errands, shopping? 0  Preparing Food and eating ? N  Using the Toilet? N  In the past six months, have you accidently leaked urine? N  Do you have problems with loss of bowel control? N  Managing your Medications? N  Managing your Finances? N  Housekeeping or managing your Housekeeping? N    Patient Care Team: Janith Lima, MD as PCP - General (Internal Medicine) Delice Bison Darnelle Maffucci, [facility] as Pharmacist (Pharmacist)  Indicate any recent Medical Services you may have received from other than [facility] providers in the past year (date may be approximate).     Assessment:   This is a routine wellness examination for [place].  Hearing/Vision screen Hearing Screening - Comments:: Patient denied any hearing difficulty.   No hearing aids.  Vision Screening - Comments:: Patient wears readers for fine print.  Dietary issues and exercise activities discussed: Current Exercise Habits: Home exercise routine, Type of exercise: walking;strength training/weights;Other - see comments (recombent bike), Time (Minutes): 30, Frequency (Times/Week): 5, Weekly Exercise  (Minutes/Week): 150, Intensity: Moderate   Goals Addressed   None   Depression Screen    10/15/2021    4:15 PM 10/17/2019    1:38 PM 08/04/2016    1:59 PM 06/04/2016   11:36 AM 10/18/2015    9:21 AM  PHQ 2/9 Scores  PHQ - 2 Score 0 0 0 0 0  PHQ- 9 Score  0     Exception Documentation     Patient refusal    Fall Risk    10/15/2021    4:13 PM 01/21/2021    3:41 PM 10/17/2019    1:37 PM 12/24/2016    1:34 PM 08/04/2016    1:59 PM  [place] in the past year? 0 0 0 No No  Number falls in past yr: 0  0    Injury with Fall? 0  0    Risk for fall due to : No Fall Risks  Impaired mobility    Follow up Falls evaluation completed  Falls evaluation completed      [place]:  Any stairs in or around the home? No  If so, are there any without handrails? No  Home free of loose throw rugs in walkways, pet beds, electrical cords, etc? Yes  Adequate lighting in your home to reduce risk of falls? Yes   ASSISTIVE DEVICES UTILIZED TO PREVENT FALLS:  Life alert? No  Use of a cane, walker or w/c? No  Grab bars in the bathroom? Yes  Shower chair or bench in shower? Yes  Elevated toilet seat or a handicapped toilet? No   TIMED UP AND GO:  Was the test performed? No .  Length of time to ambulate 10 feet: n/a sec.   Appearance of gait: Patient not evaluated for gait during this visit.  Cognitive Function:        10/15/2021    4:33 PM  6CIT Screen  What Year? 0 points  What month? 0 points  What time? 0 points  Count back from 20 0 points  Months in reverse 0 points  Repeat phrase  0 points  Total Score 0 points    Immunizations Immunization History  Administered Date(s) Administered   Fluad Quad(high Dose 65+) 01/11/2019, 01/21/2021   Influenza, High Dose Seasonal PF 02/21/2016, 02/04/2018   Influenza,inj,Quad PF,6+ Mos 03/12/2014, 04/05/2017, 01/17/2020   Moderna Covid-19 Vaccine Bivalent Booster 90yr & up 02/21/2021   Moderna SARS-COV2  Booster Vaccination 03/12/2020   Moderna Sars-Covid-2 Vaccination 06/24/2019, 07/22/2019   Pneumococcal Conjugate-13 03/12/2014   Pneumococcal Polysaccharide-23 01/02/2011, 02/21/2016, 01/11/2019   Td 07/09/2001   Tdap 03/12/2014    TDAP status: Up to date  Flu Vaccine status: Up to date  Pneumococcal vaccine status: Up to date  Covid-19 vaccine status: Completed vaccines  Qualifies for Shingles Vaccine? Yes   Zostavax completed No   Shingrix Completed?: No.    Education has been provided regarding the importance of this vaccine. Patient has been advised to call insurance company to determine out of pocket expense if they have not yet received this vaccine. Advised may also receive vaccine at local pharmacy or Health Dept. Verbalized acceptance and understanding.  Screening Tests Health Maintenance  Topic Date Due   Zoster Vaccines- Shingrix (1 of 2) Never done   COLONOSCOPY (Pts 45-494yrInsurance coverage will need to be confirmed)  07/15/2018   INFLUENZA VACCINE  12/09/2021   TETANUS/TDAP  03/12/2024   Pneumonia Vaccine 6544Years old  Completed   COVID-19 Vaccine  Completed   Hepatitis C Screening  Completed   HPV VACCINES  Aged Out    Health Maintenance  Health Maintenance Due  Topic Date Due   Zoster Vaccines- Shingrix (1 of 2) Never done   COLONOSCOPY (Pts [id number] coverage will need to be confirmed)  07/15/2018    Colorectal cancer screening: Type of screening: Colonoscopy. Completed 07/15/2015. Repeat every 3 years  Lung Cancer Screening: (Low Dose CT Chest recommended if Age 68-63-80ars, 30 pack-year currently smoking OR have quit w/in 15years.) does not qualify.   Lung Cancer Screening Referral: no  Additional Screening:  Hepatitis C Screening: does qualify; Completed 02/21/2016  Vision Screening: Recommended annual ophthalmology exams for early detection of glaucoma and other disorders of the eye. Is the patient up to date with their annual eye  exam?  No  Who is the provider or what is the name of the office in which the patient attends annual eye exams? Declined If pt is not established with a provider, would they like to be referred to a provider to establish care? No .   Dental Screening: Recommended annual dental exams for proper oral hygiene  Community Resource Referral / Chronic Care Management: CRR required this visit?  No   CCM required this visit?  No      Plan:     I have personally reviewed and noted the following in the patient's chart:   Medical and social history Use of alcohol, tobacco or illicit drugs  Current medications and supplements including opioid prescriptions. Patient is not currently taking opioid prescriptions. Functional ability and status Nutritional status Physical activity Advanced directives List of other physicians Hospitalizations, surgeries, and ER visits in previous 12 months Vitals Screenings to include cognitive, depression, and falls Referrals and appointments  In addition, I have reviewed and discussed with patient certain preventive protocols, quality metrics, and best practice recommendations. A written personalized care plan for preventive services as well as general preventive health recommendations were provided to patient.     SheSheral FlowPN   6/75/2[birth year]82Nurse Notes:  There  were no vitals filed for this visit. There is no height or weight on file to calculate BMI. Patient stated that he has no issues with gait or balance; does not use any assistive devices.

## 2021-10-15 NOTE — Patient Instructions (Signed)
Mr. Kurt Blair , Thank you for taking time to come for your Medicare Wellness Visit. I appreciate your ongoing commitment to your health goals. Please review the following plan we discussed and let me know if I can assist you in the future.   Screening recommendations/referrals: Colonoscopy: last done 07/15/2015; due every 3 years Recommended yearly ophthalmology/optometry visit for glaucoma screening and checkup Recommended yearly dental visit for hygiene and checkup  Vaccinations: Influenza vaccine: 01/21/2021 Pneumococcal vaccine: 03/12/2014, 01/11/2019 Tdap vaccine: 03/12/2014; due every 10 years Shingles vaccine: never done; can check with local pharmacy for price   Covid-19: 06/24/2019, 07/22/2019, 03/12/2020, 02/21/2021  Advanced directives: No  Conditions/risks identified: Yes  Next appointment: Please schedule your next Medicare Wellness Visit with your Nurse Health Advisor in 1 year by calling [phone].  Preventive Care 75 Years and Older, Male Preventive care refers to lifestyle choices and visits with your health care provider that can promote health and wellness. What does preventive care include? A yearly physical exam. This is also called an annual well check. Dental exams once or twice a year. Routine eye exams. Ask your health care provider how often you should have your eyes checked. Personal lifestyle choices, including: Daily care of your teeth and gums. Regular physical activity. Eating a healthy diet. Avoiding tobacco and drug use. Limiting alcohol use. Practicing safe sex. Taking low doses of aspirin every day. Taking vitamin and mineral supplements as recommended by your health care provider. What happens during an annual well check? The services and screenings done by your health care provider during your annual well check will depend on your age, overall health, lifestyle risk factors, and family history of disease. Counseling  Your health care provider may ask  you questions about your: Alcohol use. Tobacco use. Drug use. Emotional well-being. Home and relationship well-being. Sexual activity. Eating habits. History of falls. Memory and ability to understand (cognition). Work and work Statistician. Screening  You may have the following tests or measurements: Height, weight, and BMI. Blood pressure. Lipid and cholesterol levels. These may be checked every 5 years, or more frequently if you are over 59 years old. Skin check. Lung cancer screening. You may have this screening every year starting at age 75 if you have a 30-pack-year history of smoking and currently smoke or have quit within the past 15 years. Fecal occult blood test (FOBT) of the stool. You may have this test every year starting at age 75. Flexible sigmoidoscopy or colonoscopy. You may have a sigmoidoscopy every 5 years or a colonoscopy every 10 years starting at age 40. Prostate cancer screening. Recommendations will vary depending on your family history and other risks. Hepatitis C blood test. Hepatitis B blood test. Sexually transmitted disease (STD) testing. Diabetes screening. This is done by checking your blood sugar (glucose) after you have not eaten for a while (fasting). You may have this done every 1-3 years. Abdominal aortic aneurysm (AAA) screening. You may need this if you are a current or former smoker. Osteoporosis. You may be screened starting at age 75 if you are at high risk. Talk with your health care provider about your test results, treatment options, and if necessary, the need for more tests. Vaccines  Your health care provider may recommend certain vaccines, such as: Influenza vaccine. This is recommended every year. Tetanus, diphtheria, and acellular pertussis (Tdap, Td) vaccine. You may need a Td booster every 10 years. Zoster vaccine. You may need this after age 75. Pneumococcal 13-valent conjugate (PCV13) vaccine. One  dose is recommended after age  75. Pneumococcal polysaccharide (PPSV23) vaccine. One dose is recommended after age 75. Talk to your health care provider about which screenings and vaccines you need and how often you need them. This information is not intended to replace advice given to you by your health care provider. Make sure you discuss any questions you have with your health care provider. Document Released: 05/24/2015 Document Revised: 01/15/2016 Document Reviewed: 02/26/2015 Elsevier Interactive Patient Education  2017 [place] Prevention in the Home Falls can cause injuries. They can happen to people of all ages. There are many things you can do to make your home safe and to help prevent falls. What can I do on the outside of my home? Regularly fix the edges of walkways and driveways and fix any cracks. Remove anything that might make you trip as you walk through a door, such as a raised step or threshold. Trim any bushes or trees on the path to your home. Use bright outdoor lighting. Clear any walking paths of anything that might make someone trip, such as rocks or tools. Regularly check to see if handrails are loose or broken. Make sure that both sides of any steps have handrails. Any raised decks and porches should have guardrails on the edges. Have any leaves, snow, or ice cleared regularly. Use sand or salt on walking paths during winter. Clean up any spills in your garage right away. This includes oil or grease spills. What can I do in the bathroom? Use night lights. Install grab bars by the toilet and in the tub and shower. Do not use towel bars as grab bars. Use non-skid mats or decals in the tub or shower. If you need to sit down in the shower, use a plastic, non-slip stool. Keep the floor dry. Clean up any water that spills on the floor as soon as it happens. Remove soap buildup in the tub or shower regularly. Attach bath mats securely with double-sided non-slip rug tape. Do not have throw  rugs and other things on the floor that can make you trip. What can I do in the bedroom? Use night lights. Make sure that you have a light by your bed that is easy to reach. Do not use any sheets or blankets that are too big for your bed. They should not hang down onto the floor. Have a firm chair that has side arms. You can use this for support while you get dressed. Do not have throw rugs and other things on the floor that can make you trip. What can I do in the kitchen? Clean up any spills right away. Avoid walking on wet floors. Keep items that you use a lot in easy-to-reach places. If you need to reach something above you, use a strong step stool that has a grab bar. Keep electrical cords out of the way. Do not use floor polish or wax that makes floors slippery. If you must use wax, use non-skid floor wax. Do not have throw rugs and other things on the floor that can make you trip. What can I do with my stairs? Do not leave any items on the stairs. Make sure that there are handrails on both sides of the stairs and use them. Fix handrails that are broken or loose. Make sure that handrails are as long as the stairways. Check any carpeting to make sure that it is firmly attached to the stairs. Fix any carpet that is loose or worn.  Avoid having throw rugs at the top or bottom of the stairs. If you do have throw rugs, attach them to the floor with carpet tape. Make sure that you have a light switch at the top of the stairs and the bottom of the stairs. If you do not have them, ask someone to add them for you. What else can I do to help prevent falls? Wear shoes that: Do not have high heels. Have rubber bottoms. Are comfortable and fit you well. Are closed at the toe. Do not wear sandals. If you use a stepladder: Make sure that it is fully opened. Do not climb a closed stepladder. Make sure that both sides of the stepladder are locked into place. Ask someone to hold it for you, if  possible. Clearly mark and make sure that you can see: Any grab bars or handrails. First and last steps. Where the edge of each step is. Use tools that help you move around (mobility aids) if they are needed. These include: Canes. Walkers. Scooters. Crutches. Turn on the lights when you go into a dark area. Replace any light bulbs as soon as they burn out. Set up your furniture so you have a clear path. Avoid moving your furniture around. If any of your floors are uneven, fix them. If there are any pets around you, be aware of where they are. Review your medicines with your doctor. Some medicines can make you feel dizzy. This can increase your chance of falling. Ask your doctor what other things that you can do to help prevent falls. This information is not intended to replace advice given to you by your health care provider. Make sure you discuss any questions you have with your health care provider. Document Released: 02/21/2009 Document Revised: 10/03/2015 Document Reviewed: 06/01/2014 Elsevier Interactive Patient Education  2017 [employer].

## 2021-10-24 ENCOUNTER — Other Ambulatory Visit: Payer: Self-pay | Admitting: Internal Medicine

## 2021-10-24 DIAGNOSIS — E785 Hyperlipidemia, unspecified: Secondary | ICD-10-CM

## 2021-10-25 ENCOUNTER — Other Ambulatory Visit: Payer: Self-pay | Admitting: Internal Medicine

## 2021-10-25 DIAGNOSIS — I5030 Unspecified diastolic (congestive) heart failure: Secondary | ICD-10-CM

## 2021-10-25 DIAGNOSIS — I10 Essential (primary) hypertension: Secondary | ICD-10-CM

## 2021-12-12 ENCOUNTER — Other Ambulatory Visit: Payer: Self-pay | Admitting: Internal Medicine

## 2021-12-12 DIAGNOSIS — J449 Chronic obstructive pulmonary disease, unspecified: Secondary | ICD-10-CM

## 2021-12-26 ENCOUNTER — Other Ambulatory Visit: Payer: Self-pay | Admitting: Internal Medicine

## 2021-12-26 DIAGNOSIS — I5030 Unspecified diastolic (congestive) heart failure: Secondary | ICD-10-CM

## 2021-12-26 DIAGNOSIS — I10 Essential (primary) hypertension: Secondary | ICD-10-CM

## 2021-12-31 ENCOUNTER — Encounter: Payer: Self-pay | Admitting: Emergency Medicine

## 2021-12-31 ENCOUNTER — Ambulatory Visit (INDEPENDENT_AMBULATORY_CARE_PROVIDER_SITE_OTHER): Payer: Medicare Other | Admitting: Emergency Medicine

## 2021-12-31 VITALS — BP 160/98 | HR 62 | Temp 97.9°F | Ht 70.0 in | Wt 261.1 lb

## 2021-12-31 DIAGNOSIS — I5030 Unspecified diastolic (congestive) heart failure: Secondary | ICD-10-CM

## 2021-12-31 DIAGNOSIS — J449 Chronic obstructive pulmonary disease, unspecified: Secondary | ICD-10-CM | POA: Diagnosis not present

## 2021-12-31 DIAGNOSIS — E785 Hyperlipidemia, unspecified: Secondary | ICD-10-CM

## 2021-12-31 DIAGNOSIS — E039 Hypothyroidism, unspecified: Secondary | ICD-10-CM | POA: Diagnosis not present

## 2021-12-31 DIAGNOSIS — I10 Essential (primary) hypertension: Secondary | ICD-10-CM | POA: Diagnosis not present

## 2021-12-31 MED ORDER — OLMESARTAN MEDOXOMIL 40 MG PO TABS: 40.0000 mg | ORAL_TABLET | Freq: Every day | ORAL | 1 refills | Status: DC

## 2021-12-31 NOTE — Assessment & Plan Note (Signed)
Diet and nutrition discussed.  Advised to decrease amount of daily carbohydrate intake and daily calories and increase amount of plant based protein in his diet

## 2021-12-31 NOTE — Patient Instructions (Signed)
Hypertension, Adult High blood pressure (hypertension) is when the force of blood pumping through the arteries is too strong. The arteries are the blood vessels that carry blood from the heart throughout the body. Hypertension forces the heart to work harder to pump blood and may cause arteries to become narrow or stiff. Untreated or uncontrolled hypertension can lead to a heart attack, heart failure, a stroke, kidney disease, and other problems. A blood pressure reading consists of a higher number over a lower number. Ideally, your blood pressure should be below 120/80. The first ("top") number is called the systolic pressure. It is a measure of the pressure in your arteries as your heart beats. The second ("bottom") number is called the diastolic pressure. It is a measure of the pressure in your arteries as the heart relaxes. What are the causes? The exact cause of this condition is not known. There are some conditions that result in high blood pressure. What increases the risk? Certain factors may make you more likely to develop high blood pressure. Some of these risk factors are under your control, including: Smoking. Not getting enough exercise or physical activity. Being overweight. Having too much fat, sugar, calories, or salt (sodium) in your diet. Drinking too much alcohol. Other risk factors include: Having a personal history of heart disease, diabetes, high cholesterol, or kidney disease. Stress. Having a family history of high blood pressure and high cholesterol. Having obstructive sleep apnea. Age. The risk increases with age. What are the signs or symptoms? High blood pressure may not cause symptoms. Very high blood pressure (hypertensive crisis) may cause: Headache. Fast or irregular heartbeats (palpitations). Shortness of breath. Nosebleed. Nausea and vomiting. Vision changes. Severe chest pain, dizziness, and seizures. How is this diagnosed? This condition is diagnosed by  measuring your blood pressure while you are seated, with your arm resting on a flat surface, your legs uncrossed, and your feet flat on the floor. The cuff of the blood pressure monitor will be placed directly against the skin of your upper arm at the level of your heart. Blood pressure should be measured at least twice using the same arm. Certain conditions can cause a difference in blood pressure between your right and left arms. If you have a high blood pressure reading during one visit or you have normal blood pressure with other risk factors, you may be asked to: Return on a different day to have your blood pressure checked again. Monitor your blood pressure at home for 1 week or longer. If you are diagnosed with hypertension, you may have other blood or imaging tests to help your health care provider understand your overall risk for other conditions. How is this treated? This condition is treated by making healthy lifestyle changes, such as eating healthy foods, exercising more, and reducing your alcohol intake. You may be referred for counseling on a healthy diet and physical activity. Your health care provider may prescribe medicine if lifestyle changes are not enough to get your blood pressure under control and if: Your systolic blood pressure is above 130. Your diastolic blood pressure is above 80. Your personal target blood pressure may vary depending on your medical conditions, your age, and other factors. Follow these instructions at home: Eating and drinking  Eat a diet that is high in fiber and potassium, and low in sodium, added sugar, and fat. An example of this eating plan is called the DASH diet. DASH stands for Dietary Approaches to Stop Hypertension. To eat this way: Eat   plenty of fresh fruits and vegetables. Try to fill one half of your plate at each meal with fruits and vegetables. Eat whole grains, such as whole-wheat pasta, brown rice, or whole-grain bread. Fill about one  fourth of your plate with whole grains. Eat or drink low-fat dairy products, such as skim milk or low-fat yogurt. Avoid fatty cuts of meat, processed or cured meats, and poultry with skin. Fill about one fourth of your plate with lean proteins, such as fish, chicken without skin, beans, eggs, or tofu. Avoid pre-made and processed foods. These tend to be higher in sodium, added sugar, and fat. Reduce your daily sodium intake. Many people with hypertension should eat less than 1,500 mg of sodium a day. Do not drink alcohol if: Your health care provider tells you not to drink. You are pregnant, may be pregnant, or are planning to become pregnant. If you drink alcohol: Limit how much you have to: 0-1 drink a day for women. 0-2 drinks a day for men. Know how much alcohol is in your drink. In the [place] one drink equals one 12 oz bottle of beer (355 mL), one 5 oz glass of wine (148 mL), or one 1 oz glass of hard liquor (44 mL). Lifestyle  Work with your health care provider to maintain a healthy body weight or to lose weight. Ask what an ideal weight is for you. Get at least 30 minutes of exercise that causes your heart to beat faster (aerobic exercise) most days of the week. Activities may include walking, swimming, or biking. Include exercise to strengthen your muscles (resistance exercise), such as Pilates or lifting weights, as part of your weekly exercise routine. Try to do these types of exercises for 30 minutes at least 3 days a week. Do not use any products that contain nicotine or tobacco. These products include cigarettes, chewing tobacco, and vaping devices, such as e-cigarettes. If you need help quitting, ask your health care provider. Monitor your blood pressure at home as told by your health care provider. Keep all follow-up visits. This is important. Medicines Take over-the-counter and prescription medicines only as told by your health care provider. Follow directions carefully. Blood  pressure medicines must be taken as prescribed. Do not skip doses of blood pressure medicine. Doing this puts you at risk for problems and can make the medicine less effective. Ask your health care provider about side effects or reactions to medicines that you should watch for. Contact a health care provider if you: Think you are having a reaction to a medicine you are taking. Have headaches that keep coming back (recurring). Feel dizzy. Have swelling in your ankles. Have trouble with your vision. Get help right away if you: Develop a severe headache or confusion. Have unusual weakness or numbness. Feel faint. Have severe pain in your chest or abdomen. Vomit repeatedly. Have trouble breathing. These symptoms may be an emergency. Get help right away. Call 911. Do not wait to see if the symptoms will go away. Do not drive yourself to the hospital. Summary Hypertension is when the force of blood pumping through your arteries is too strong. If this condition is not controlled, it may put you at risk for serious complications. Your personal target blood pressure may vary depending on your medical conditions, your age, and other factors. For most people, a normal blood pressure is less than 120/80. Hypertension is treated with lifestyle changes, medicines, or a combination of both. Lifestyle changes include losing weight, eating a healthy,   low-sodium diet, exercising more, and limiting alcohol. This information is not intended to replace advice given to you by your health care provider. Make sure you discuss any questions you have with your health care provider. Document Revised: 03/04/2021 Document Reviewed: 03/04/2021 [employer] Patient Education  2023 [employer]  

## 2021-12-31 NOTE — Assessment & Plan Note (Signed)
Elevated blood pressure reading in the office today.  Has been off olmesartan for couple days. Restart olmesartan 40 mg daily. Cardiovascular risks associated with uncontrolled hypertension discussed. Dietary approaches to stop hypertension discussed.

## 2021-12-31 NOTE — Assessment & Plan Note (Signed)
Clinically euthyroid.  Off medication.

## 2021-12-31 NOTE — Assessment & Plan Note (Signed)
Diet and nutrition discussed. Continue atorvastatin 80 mg daily. The 10-year ASCVD risk score (Arnett DK, et al., 2019) is: 29.7%   Values used to calculate the score:     Age: 76 years     Sex: Male     Is Non-Hispanic African American: Yes     Diabetic: No     Tobacco smoker: No     Systolic Blood Pressure: 200 mmHg     Is BP treated: Yes     HDL Cholesterol: 45.1 mg/dL     Total Cholesterol: 137 mg/dL

## 2021-12-31 NOTE — Assessment & Plan Note (Addendum)
Has peripheral edema.  Dyspnea on exertion. Continue Demadex 20 mg twice a day. Continue carvedilol 12.5 mg twice daily.

## 2021-12-31 NOTE — Assessment & Plan Note (Signed)
Dyspnea on exertion.  Physical findings of right-sided heart failure present. Continue Breztri 2 puffs twice a day and albuterol as rescue inhaler.

## 2021-12-31 NOTE — Progress Notes (Signed)
Kurt Blair 76 y.o.   Chief Complaint  Patient presents with   Medication Refill   Shortness of Breath    SOB for years now , takes inhalers it helps but still have SOB     HISTORY OF PRESENT ILLNESS: This is a 76 y.o. male patient of Dr. Scarlette Calico here for medication refill. Has history of hypertension and ran out of olmesartan couple days ago. Also has history of COPD and dyspnea on exertion for years. Not taking Trelegy.  Taking Breztri instead. No other complaints or medical concerns today. Needs to schedule appointment with Dr. Ronnald Ramp for physical and follow-up of multiple chronic medical problems.  HPI   Prior to Admission medications   Medication Sig Start Date End Date Taking? Authorizing Provider  acetaminophen (TYLENOL) 650 MG CR tablet Take 650 mg by mouth every 8 (eight) hours as needed.    [provider]  atorvastatin (LIPITOR) 80 MG tablet TAKE 1 TABLET BY MOUTH EVERY DAY 10/24/21   Janith Lima, MD  carvedilol (COREG) 12.5 MG tablet TAKE 1 TABLET (12.5MG TOTAL) BY MOUTH TWICE A DAY WITH MEALS 10/25/21   Janith Lima, MD  Cholecalciferol 50 MCG (2000 UT) TABS Take 1 tablet (2,000 Units total) by mouth daily. 10/17/19   Janith Lima, MD  Fluticasone-Umeclidin-Vilant (TRELEGY ELLIPTA) 100-62.5-25 MCG/INH AEPB Inhale 1 puff into the lungs daily. 01/21/21   Janith Lima, MD  folic acid (FOLVITE) 1 MG tablet TAKE 1 TABLET BY MOUTH EVERY DAY 07/25/21   Janith Lima, MD  KLOR-CON M20 20 MEQ tablet TAKE 1 TABLET BY MOUTH TWICE A DAY 08/20/21   Janith Lima, MD  levalbuterol [facility] HFA) 45 MCG/ACT inhaler Inhale 1 puff into the lungs every 6 (six) hours as needed for wheezing. Patient not taking: Reported on 08/22/2021 02/15/21   Janith Lima, MD  levocetirizine (XYZAL) 5 MG tablet TAKE 1 TABLET BY MOUTH EVERY DAY IN THE EVENING 08/20/21   Janith Lima, MD  Menthol, Topical Analgesic, (ICY HOT PAIN RELIEVING EX) Apply topically daily as needed.     [provider]  Multiple Vitamin (MULTIVITAMIN) tablet Take 1 tablet by mouth daily. Centrum silver - mens 50+    [provider]  olmesartan (BENICAR) 40 MG tablet TAKE 1 TABLET BY MOUTH EVERY DAY 09/26/21   Janith Lima, MD  torsemide (DEMADEX) 20 MG tablet Take 1 tablet (20 mg total) by mouth 2 (two) times daily. 07/28/21   Janith Lima, MD    Allergies  Allergen Reactions   Latex Hives and Rash    Patient Active Problem List   Diagnosis Date Noted   Abnormal electrocardiogram 01/25/2021   Encounter for general adult medical examination with abnormal findings 01/25/2021   Prediabetes 01/21/2021   B12 deficiency 01/21/2021   Abnormal electrocardiogram (ECG) (EKG) 01/21/2021   Dietary folate deficiency anemia 11/01/2019   Dyslipidemia, goal LDL below 100 10/17/2019   Benign prostatic hyperplasia without lower urinary tract symptoms 10/17/2019   Vitamin D deficiency disease 06/29/2018   Morbid obesity due to excess calories complicated by hbp/ hyperlipidemia  02/05/2018   COPD GOLD 0  12/23/2017   Exercise hypoxemia 11/20/2017   CHF with left ventricular diastolic dysfunction, NYHA class 1 ([place]) 11/09/2017   Arthritis of shoulder region, degenerative 06/04/2016   Osteoarthritis of right knee 06/04/2016   Essential hypertension 04/14/2015   Hypothyroidism 11/13/2014   Routine general medical examination at a health care facility 06/03/2012  ALCOHOL USE 06/07/2008   NASH (nonalcoholic steatohepatitis) 06/07/2008   SUBACROMIAL BURSITIS, RIGHT 06/07/2008   ERECTILE DYSFUNCTION 02/16/2007   SMOKER 02/16/2007   VENOUS INSUFFICIENCY 02/16/2007    Past Medical History:  Diagnosis Date   ANXIETY 02/16/2007   DEPRESSION 02/16/2007   Diverticulosis    ERECTILE DYSFUNCTION 02/16/2007   FATTY LIVER DISEASE 06/07/2008   HYPERGLYCEMIA 02/16/2007   Hyperlipidemia    Internal hemorrhoid    NASH (nonalcoholic steatohepatitis)    PULMONARY EMBOLISM, HX OF 02/16/2007    SUBACROMIAL BURSITIS, RIGHT 06/07/2008   Tubular adenoma of colon 07/2001   VENOUS INSUFFICIENCY 02/16/2007    Past Surgical History:  Procedure Laterality Date   COLONOSCOPY     Drainage fluid from chest  Jul 18, 1988   Leg stripping  July 18, 2001   Left     Social History   Socioeconomic History   Marital status: Married    Spouse name: Not on file   Number of children: Not on file   Years of education: Not on file   Highest education level: Not on file  Occupational History   Occupation: Landscaping    Comment: retired  Tobacco Use   Smoking status: Former    Packs/day: 0.50    Years: 50.00    Total pack years: 25.00    Types: Cigarettes    Quit date: 05/11/2013    Years since quitting: 8.6   Smokeless tobacco: Never  Vaping Use   Vaping Use: Never used  Substance and Sexual Activity   Alcohol use: Yes    Alcohol/week: 14.0 standard drinks of alcohol    Types: 14 Shots of liquor per week    Comment: every night 6oz brandy   Drug use: No   Sexual activity: Not on file  Other Topics Concern   Not on file  Social History Narrative   [name] (who pt and wife raised) died of cancer 2009/07/18   Social Determinants of Health   Financial Resource Strain: Low Risk  (10/15/2021)   Overall Financial Resource Strain (CARDIA)    Difficulty of Paying Living Expenses: Not hard at all  Food Insecurity: No Food Insecurity (10/15/2021)   Hunger Vital Sign    Worried About Running Out of Food in the Last Year: Never true    Ran Out of Food in the Last Year: Never true  Transportation Needs: No Transportation Needs (10/15/2021)   PRAPARE - Hydrologist (Medical): No    Lack of Transportation (Non-Medical): No  Physical Activity: Sufficiently Active (10/15/2021)   Exercise Vital Sign    Days of Exercise per Week: 5 days    Minutes of Exercise per Session: 30 min  Stress: No Stress Concern Present (10/15/2021)   [place]    Feeling of Stress : Not at all  Social Connections: [place] (10/15/2021)   Social Connection and Isolation Panel [NHANES]    Frequency of Communication with Friends and Family: More than three times a week    Frequency of Social Gatherings with Friends and Family: More than three times a week    Attends Religious Services: More than 4 times per year    Active Member of [employer] or Organizations: Yes    Attends Archivist Meetings: More than 4 times per year    Marital Status: Married  Human resources officer Violence: Not At Risk (10/15/2021)   Humiliation, Afraid, Rape, and Kick questionnaire    Fear of  Current or Ex-Partner: No    Emotionally Abused: No    Physically Abused: No    Sexually Abused: No    Family History  Problem Relation Age of Onset   Cancer Mother        uncertain type   Cancer Sister        Lung Disease   Colon cancer Neg Hx    Esophageal cancer Neg Hx    Rectal cancer Neg Hx    Stomach cancer Neg Hx    Prostate cancer Neg Hx    Pancreatic cancer Neg Hx      Review of Systems  Constitutional: Negative.  Negative for fever.  HENT: Negative.  Negative for congestion and sore throat.   Respiratory:  Positive for shortness of breath (Dyspnea on exertion). Negative for cough.   Cardiovascular: Negative.  Negative for chest pain and palpitations.  Gastrointestinal:  Negative for abdominal pain, nausea and vomiting.  Genitourinary: Negative.   Skin: Negative.  Negative for rash.  Neurological:  Negative for dizziness and headaches.  All other systems reviewed and are negative.  Today's Vitals   12/31/21 1500 12/31/21 1506  BP: (!) 188/100 (!) 160/98  Pulse: 62   Temp: 97.9 F (36.6 C)   TempSrc: Oral   SpO2: 90%   Weight: 261 lb 2 oz (118.4 kg)   Height: 5' 10"  (1.778 m)    Body mass index is 37.47 kg/m.   Physical Exam Vitals reviewed.  Constitutional:      Appearance: He is well-developed.  HENT:     Head:  Normocephalic.     Mouth/Throat:     Mouth: Mucous membranes are moist.     Pharynx: Oropharynx is clear.  Eyes:     Extraocular Movements: Extraocular movements intact.     Pupils: Pupils are equal, round, and reactive to light.  Cardiovascular:     Rate and Rhythm: Normal rate and regular rhythm.     Pulses: Normal pulses.     Heart sounds: Normal heart sounds.  Pulmonary:     Effort: Pulmonary effort is normal.     Breath sounds: Normal breath sounds.  Abdominal:     Palpations: Abdomen is soft.     Tenderness: There is no abdominal tenderness.  Musculoskeletal:     Cervical back: No tenderness.     Right lower leg: Edema present.     Left lower leg: Edema present.  Lymphadenopathy:     Cervical: No cervical adenopathy.  Skin:    General: Skin is warm and dry.     Capillary Refill: Capillary refill takes less than 2 seconds.  Neurological:     General: No focal deficit present.     Mental Status: He is alert and oriented to person, place, and time.  Psychiatric:        Mood and Affect: Mood normal.        Behavior: Behavior normal.      ASSESSMENT & PLAN: A total of 51 minutes was spent with the patient and counseling/coordination of care regarding preparing for this visit, review of most recent office visit notes and available medical records, review of multiple chronic medical problems and their treatment, review of all medications, review of most recent blood work results, education on nutrition, prognosis, documentation, need for follow-up with PCP Dr. Scarlette Calico.  Problem List Items Addressed This Visit       Cardiovascular and Mediastinum   Essential hypertension - Primary    Elevated blood pressure  reading in the office today.  Has been off olmesartan for couple days. Restart olmesartan 40 mg daily. Cardiovascular risks associated with uncontrolled hypertension discussed. Dietary approaches to stop hypertension discussed.      Relevant Medications    olmesartan (BENICAR) 40 MG tablet   CHF with left ventricular diastolic dysfunction, NYHA class 1 ([place])    Has peripheral edema.  Dyspnea on exertion. Continue Demadex 20 mg twice a day. Continue carvedilol 12.5 mg twice daily.      Relevant Medications   olmesartan (BENICAR) 40 MG tablet     Respiratory   COPD GOLD 0     Dyspnea on exertion.  Physical findings of right-sided heart failure present. Continue Breztri 2 puffs twice a day and albuterol as rescue inhaler.      Relevant Medications   albuterol (VENTOLIN HFA) 108 (90 Base) MCG/ACT inhaler   Budeson-Glycopyrrol-Formoterol 160-9-4.8 MCG/ACT AERO     Endocrine   Hypothyroidism    Clinically euthyroid.  Off medication.        Other   Morbid obesity due to excess calories complicated by hbp/ hyperlipidemia     Diet and nutrition discussed.  Advised to decrease amount of daily carbohydrate intake and daily calories and increase amount of plant based protein in his diet      Dyslipidemia, goal LDL below 100    Diet and nutrition discussed. Continue atorvastatin 80 mg daily. The 10-year ASCVD risk score (Arnett DK, et al., 2019) is: 29.7%   Values used to calculate the score:     Age: 10 years     Sex: Male     Is Non-Hispanic African American: Yes     Diabetic: No     Tobacco smoker: No     Systolic Blood Pressure: 195 mmHg     Is BP treated: Yes     HDL Cholesterol: 45.1 mg/dL     Total Cholesterol: 137 mg/dL       Relevant Medications   olmesartan (BENICAR) 40 MG tablet   Patient Instructions  Hypertension, Adult High blood pressure (hypertension) is when the force of blood pumping through the arteries is too strong. The arteries are the blood vessels that carry blood from the heart throughout the body. Hypertension forces the heart to work harder to pump blood and may cause arteries to become narrow or stiff. Untreated or uncontrolled hypertension can lead to a heart attack, heart failure, a stroke, kidney  disease, and other problems. A blood pressure reading consists of a higher number over a lower number. Ideally, your blood pressure should be below 120/80. The first ("top") number is called the systolic pressure. It is a measure of the pressure in your arteries as your heart beats. The second ("bottom") number is called the diastolic pressure. It is a measure of the pressure in your arteries as the heart relaxes. What are the causes? The exact cause of this condition is not known. There are some conditions that result in high blood pressure. What increases the risk? Certain factors may make you more likely to develop high blood pressure. Some of these risk factors are under your control, including: Smoking. Not getting enough exercise or physical activity. Being overweight. Having too much fat, sugar, calories, or salt (sodium) in your diet. Drinking too much alcohol. Other risk factors include: Having a personal history of heart disease, diabetes, high cholesterol, or kidney disease. Stress. Having a family history of high blood pressure and high cholesterol. Having obstructive sleep apnea. Age.  The risk increases with age. What are the signs or symptoms? High blood pressure may not cause symptoms. Very high blood pressure (hypertensive crisis) may cause: Headache. Fast or irregular heartbeats (palpitations). Shortness of breath. Nosebleed. Nausea and vomiting. Vision changes. Severe chest pain, dizziness, and seizures. How is this diagnosed? This condition is diagnosed by measuring your blood pressure while you are seated, with your arm resting on a flat surface, your legs uncrossed, and your feet flat on the floor. The cuff of the blood pressure monitor will be placed directly against the skin of your upper arm at the level of your heart. Blood pressure should be measured at least twice using the same arm. Certain conditions can cause a difference in blood pressure between your right  and left arms. If you have a high blood pressure reading during one visit or you have normal blood pressure with other risk factors, you may be asked to: Return on a different day to have your blood pressure checked again. Monitor your blood pressure at home for 1 week or longer. If you are diagnosed with hypertension, you may have other blood or imaging tests to help your health care provider understand your overall risk for other conditions. How is this treated? This condition is treated by making healthy lifestyle changes, such as eating healthy foods, exercising more, and reducing your alcohol intake. You may be referred for counseling on a healthy diet and physical activity. Your health care provider may prescribe medicine if lifestyle changes are not enough to get your blood pressure under control and if: Your systolic blood pressure is above 130. Your diastolic blood pressure is above 80. Your personal target blood pressure may vary depending on your medical conditions, your age, and other factors. Follow these instructions at home: Eating and drinking  Eat a diet that is high in fiber and potassium, and low in sodium, added sugar, and fat. An example of this eating plan is called the DASH diet. DASH stands for Dietary Approaches to Stop Hypertension. To eat this way: Eat plenty of fresh fruits and vegetables. Try to fill one half of your plate at each meal with fruits and vegetables. Eat whole grains, such as whole-wheat pasta, brown rice, or whole-grain bread. Fill about one fourth of your plate with whole grains. Eat or drink low-fat dairy products, such as skim milk or low-fat yogurt. Avoid fatty cuts of meat, processed or cured meats, and poultry with skin. Fill about one fourth of your plate with lean proteins, such as fish, chicken without skin, beans, eggs, or tofu. Avoid pre-made and processed foods. These tend to be higher in sodium, added sugar, and fat. Reduce your daily sodium  intake. Many people with hypertension should eat less than 1,500 mg of sodium a day. Do not drink alcohol if: Your health care provider tells you not to drink. You are pregnant, may be pregnant, or are planning to become pregnant. If you drink alcohol: Limit how much you have to: 0-1 drink a day for women. 0-2 drinks a day for men. Know how much alcohol is in your drink. In the [place] one drink equals one 12 oz bottle of beer (355 mL), one 5 oz glass of wine (148 mL), or one 1 oz glass of hard liquor (44 mL). Lifestyle  Work with your health care provider to maintain a healthy body weight or to lose weight. Ask what an ideal weight is for you. Get at least 30 minutes of exercise that causes  your heart to beat faster (aerobic exercise) most days of the week. Activities may include walking, swimming, or biking. Include exercise to strengthen your muscles (resistance exercise), such as Pilates or lifting weights, as part of your weekly exercise routine. Try to do these types of exercises for 30 minutes at least 3 days a week. Do not use any products that contain nicotine or tobacco. These products include cigarettes, chewing tobacco, and vaping devices, such as e-cigarettes. If you need help quitting, ask your health care provider. Monitor your blood pressure at home as told by your health care provider. Keep all follow-up visits. This is important. Medicines Take over-the-counter and prescription medicines only as told by your health care provider. Follow directions carefully. Blood pressure medicines must be taken as prescribed. Do not skip doses of blood pressure medicine. Doing this puts you at risk for problems and can make the medicine less effective. Ask your health care provider about side effects or reactions to medicines that you should watch for. Contact a health care provider if you: Think you are having a reaction to a medicine you are taking. Have headaches that keep coming back  (recurring). Feel dizzy. Have swelling in your ankles. Have trouble with your vision. Get help right away if you: Develop a severe headache or confusion. Have unusual weakness or numbness. Feel faint. Have severe pain in your chest or abdomen. Vomit repeatedly. Have trouble breathing. These symptoms may be an emergency. Get help right away. Call 911. Do not wait to see if the symptoms will go away. Do not drive yourself to the hospital. Summary Hypertension is when the force of blood pumping through your arteries is too strong. If this condition is not controlled, it may put you at risk for serious complications. Your personal target blood pressure may vary depending on your medical conditions, your age, and other factors. For most people, a normal blood pressure is less than 120/80. Hypertension is treated with lifestyle changes, medicines, or a combination of both. Lifestyle changes include losing weight, eating a healthy, low-sodium diet, exercising more, and limiting alcohol. This information is not intended to replace advice given to you by your health care provider. Make sure you discuss any questions you have with your health care provider. Document Revised: 03/04/2021 Document Reviewed: 03/04/2021 Elsevier Patient Education  [place], MD [facility]

## 2022-01-21 ENCOUNTER — Other Ambulatory Visit: Payer: Self-pay | Admitting: Internal Medicine

## 2022-01-21 DIAGNOSIS — I10 Essential (primary) hypertension: Secondary | ICD-10-CM

## 2022-01-21 DIAGNOSIS — I5030 Unspecified diastolic (congestive) heart failure: Secondary | ICD-10-CM

## 2022-01-22 ENCOUNTER — Other Ambulatory Visit: Payer: Self-pay | Admitting: Internal Medicine

## 2022-01-22 DIAGNOSIS — D520 Dietary folate deficiency anemia: Secondary | ICD-10-CM

## 2022-01-28 ENCOUNTER — Other Ambulatory Visit: Payer: Self-pay | Admitting: Internal Medicine

## 2022-01-28 DIAGNOSIS — J449 Chronic obstructive pulmonary disease, unspecified: Secondary | ICD-10-CM

## 2022-01-28 MED ORDER — BUDESON-GLYCOPYRROL-FORMOTEROL 160-9-4.8 MCG/ACT IN AERO: 2.0000 | INHALATION_SPRAY | Freq: Two times a day (BID) | RESPIRATORY_TRACT | 1 refills | Status: DC

## 2022-01-29 ENCOUNTER — Other Ambulatory Visit: Payer: Self-pay | Admitting: Internal Medicine

## 2022-01-29 DIAGNOSIS — I10 Essential (primary) hypertension: Secondary | ICD-10-CM

## 2022-01-29 DIAGNOSIS — I5030 Unspecified diastolic (congestive) heart failure: Secondary | ICD-10-CM

## 2022-02-12 ENCOUNTER — Encounter: Payer: Self-pay | Admitting: Internal Medicine

## 2022-02-12 ENCOUNTER — Ambulatory Visit (INDEPENDENT_AMBULATORY_CARE_PROVIDER_SITE_OTHER): Payer: Medicare Other | Admitting: Internal Medicine

## 2022-02-12 VITALS — BP 158/92 | HR 62 | Temp 98.0°F | Ht 70.0 in | Wt 257.0 lb

## 2022-02-12 DIAGNOSIS — E785 Hyperlipidemia, unspecified: Secondary | ICD-10-CM | POA: Diagnosis not present

## 2022-02-12 DIAGNOSIS — K7581 Nonalcoholic steatohepatitis (NASH): Secondary | ICD-10-CM

## 2022-02-12 DIAGNOSIS — R7303 Prediabetes: Secondary | ICD-10-CM

## 2022-02-12 DIAGNOSIS — E039 Hypothyroidism, unspecified: Secondary | ICD-10-CM | POA: Diagnosis not present

## 2022-02-12 DIAGNOSIS — R600 Localized edema: Secondary | ICD-10-CM

## 2022-02-12 DIAGNOSIS — D520 Dietary folate deficiency anemia: Secondary | ICD-10-CM

## 2022-02-12 DIAGNOSIS — I10 Essential (primary) hypertension: Secondary | ICD-10-CM | POA: Diagnosis not present

## 2022-02-12 DIAGNOSIS — Z0000 Encounter for general adult medical examination without abnormal findings: Secondary | ICD-10-CM

## 2022-02-12 DIAGNOSIS — E538 Deficiency of other specified B group vitamins: Secondary | ICD-10-CM

## 2022-02-12 DIAGNOSIS — N400 Enlarged prostate without lower urinary tract symptoms: Secondary | ICD-10-CM

## 2022-02-12 DIAGNOSIS — Z0001 Encounter for general adult medical examination with abnormal findings: Secondary | ICD-10-CM

## 2022-02-12 DIAGNOSIS — Z23 Encounter for immunization: Secondary | ICD-10-CM | POA: Diagnosis not present

## 2022-02-12 LAB — CBC WITH DIFFERENTIAL/PLATELET
Basophils Absolute: 0 10*3/uL (ref 0.0–0.1)
Basophils Relative: 0.3 % (ref 0.0–3.0)
Eosinophils Absolute: 0.1 10*3/uL (ref 0.0–0.7)
Eosinophils Relative: 2.3 % (ref 0.0–5.0)
HCT: 47.7 % (ref 39.0–52.0)
Hemoglobin: 16 g/dL (ref 13.0–17.0)
Lymphocytes Relative: 31.6 % (ref 12.0–46.0)
Lymphs Abs: 1.8 10*3/uL (ref 0.7–4.0)
MCHC: 33.6 g/dL (ref 30.0–36.0)
MCV: 90.2 fl (ref 78.0–100.0)
Monocytes Absolute: 0.5 10*3/uL (ref 0.1–1.0)
Monocytes Relative: 8.3 % (ref 3.0–12.0)
Neutro Abs: 3.4 10*3/uL (ref 1.4–7.7)
Neutrophils Relative %: 57.5 % (ref 43.0–77.0)
Platelets: 173 10*3/uL (ref 150.0–400.0)
RBC: 5.29 Mil/uL (ref 4.22–5.81)
RDW: 14.3 % (ref 11.5–15.5)
WBC: 5.8 10*3/uL (ref 4.0–10.5)

## 2022-02-12 LAB — URINALYSIS, ROUTINE W REFLEX MICROSCOPIC
Bilirubin Urine: NEGATIVE
Hgb urine dipstick: NEGATIVE
Ketones, ur: NEGATIVE
Leukocytes,Ua: NEGATIVE
Nitrite: NEGATIVE
Specific Gravity, Urine: 1.015 (ref 1.000–1.030)
Total Protein, Urine: 100 — AB
Urine Glucose: NEGATIVE
Urobilinogen, UA: 2 — AB (ref 0.0–1.0)
pH: 6 (ref 5.0–8.0)

## 2022-02-12 LAB — HEPATIC FUNCTION PANEL
ALT: 15 U/L (ref 0–53)
AST: 23 U/L (ref 0–37)
Albumin: 3.9 g/dL (ref 3.5–5.2)
Alkaline Phosphatase: 65 U/L (ref 39–117)
Bilirubin, Direct: 0.3 mg/dL (ref 0.0–0.3)
Total Bilirubin: 1.1 mg/dL (ref 0.2–1.2)
Total Protein: 6.7 g/dL (ref 6.0–8.3)

## 2022-02-12 LAB — LIPID PANEL
Cholesterol: 147 mg/dL (ref 0–200)
HDL: 50.9 mg/dL (ref >39.00)
LDL Cholesterol: 67 mg/dL (ref 0–99)
NonHDL: 95.87
Total CHOL/HDL Ratio: 3
Triglycerides: 144 mg/dL (ref 0.0–149.0)
VLDL: 28.8 mg/dL (ref 0.0–40.0)

## 2022-02-12 LAB — TSH: TSH: 3.89 u[IU]/mL (ref 0.35–5.50)

## 2022-02-12 LAB — BASIC METABOLIC PANEL
BUN: 15 mg/dL (ref 6–23)
CO2: 33 mEq/L — ABNORMAL HIGH (ref 19–32)
Calcium: 9.2 mg/dL (ref 8.4–10.5)
Chloride: 99 mEq/L (ref 96–112)
Creatinine, Ser: 1.09 mg/dL (ref 0.40–1.50)
GFR: 66.29 mL/min (ref >60.00)
Glucose, Bld: 83 mg/dL (ref 70–99)
Potassium: 3.8 mEq/L (ref 3.5–5.1)
Sodium: 140 mEq/L (ref 135–145)

## 2022-02-12 LAB — PSA: PSA: 2.59 ng/mL (ref 0.10–4.00)

## 2022-02-12 LAB — D-DIMER, QUANTITATIVE: D-Dimer, Quant: 0.58 mcg/mL FEU — ABNORMAL HIGH (ref <0.50)

## 2022-02-12 LAB — HEMOGLOBIN A1C: Hgb A1c MFr Bld: 6.2 % (ref 4.6–6.5)

## 2022-02-12 LAB — TROPONIN I (HIGH SENSITIVITY): High Sens Troponin I: 13 ng/L (ref 2–17)

## 2022-02-12 LAB — BRAIN NATRIURETIC PEPTIDE: Pro B Natriuretic peptide (BNP): 89 pg/mL (ref 0.0–100.0)

## 2022-02-12 LAB — FOLATE: Folate: >23.9 ng/mL (ref >5.9)

## 2022-02-12 LAB — VITAMIN B12: Vitamin B-12: 304 pg/mL (ref 211–911)

## 2022-02-12 NOTE — Progress Notes (Signed)
Subjective:  Patient ID: Kurt Blair, male    DOB: December 23, 1945  Age: 76 y.o. MRN: [id number]  CC: Annual Exam, Hypertension, and Hyperlipidemia   HPI Kurt Blair presents for a CPX and f/up -  He complains of a recent increase in DOE but denies CP, diaphoresis, or palpitations. RLE is swollen, there is a compression stocking over the LLE.  Outpatient Medications Prior to Visit  Medication Sig Dispense Refill   acetaminophen (TYLENOL) 650 MG CR tablet Take 650 mg by mouth every 8 (eight) hours as needed.     albuterol (VENTOLIN HFA) 108 (90 Base) MCG/ACT inhaler TAKE 2 PUFFS BY MOUTH EVERY 6 HOURS AS NEEDED FOR WHEEZE OR SHORTNESS OF BREATH 6.7 each 5   atorvastatin (LIPITOR) 80 MG tablet TAKE 1 TABLET BY MOUTH EVERY DAY 90 tablet 1   Budeson-Glycopyrrol-Formoterol 160-9-4.8 MCG/ACT AERO Inhale 2 puffs into the lungs in the morning and at bedtime. 32.1 g 1   carvedilol (COREG) 12.5 MG tablet TAKE 1 TABLET (12.5MG TOTAL) BY MOUTH TWICE A DAY WITH MEALS 180 tablet 0   Cholecalciferol 50 MCG (2000 UT) TABS Take 1 tablet (2,000 Units total) by mouth daily. 90 tablet 1   folic acid (FOLVITE) 1 MG tablet TAKE 1 TABLET BY MOUTH EVERY DAY 90 tablet 1   levalbuterol (XOPENEX HFA) 45 MCG/ACT inhaler Inhale 1 puff into the lungs every 6 (six) hours as needed for wheezing. 45 g 2   Menthol, Topical Analgesic, (ICY HOT PAIN RELIEVING EX) Apply topically daily as needed.     Multiple Vitamin (MULTIVITAMIN) tablet Take 1 tablet by mouth daily. Centrum silver - mens 50+     olmesartan (BENICAR) 40 MG tablet Take 1 tablet (40 mg total) by mouth daily. 90 tablet 1   torsemide (DEMADEX) 20 MG tablet TAKE 1 TABLET BY MOUTH TWICE A DAY 180 tablet 0   KLOR-CON M20 20 MEQ tablet TAKE 1 TABLET BY MOUTH TWICE A DAY 180 tablet 1   levocetirizine (XYZAL) 5 MG tablet TAKE 1 TABLET BY MOUTH EVERY DAY IN THE EVENING 90 tablet 1   No facility-administered medications prior to visit.    ROS Review of Systems   Constitutional: Negative.  Negative for chills, diaphoresis, fatigue and fever.  HENT: Negative.    Eyes: Negative.   Respiratory:  Positive for shortness of breath. Negative for chest tightness and wheezing.   Cardiovascular:  Positive for leg swelling. Negative for chest pain and palpitations.  Gastrointestinal:  Negative for abdominal pain, constipation, diarrhea, nausea and vomiting.  Endocrine: Negative.   Genitourinary: Negative.  Negative for difficulty urinating.  Musculoskeletal: Negative.   Skin: Negative.   Neurological:  Negative for dizziness, weakness and light-headedness.  Hematological:  Does not bruise/bleed easily.  Psychiatric/Behavioral: Negative.      Objective:  BP (!) 158/92 (BP Location: Left Arm, Patient Position: Sitting, Cuff Size: Large)   Pulse 62   Temp 98 F (36.7 C) (Oral)   Ht 5' 10"  (1.778 m)   Wt 257 lb (116.6 kg)   SpO2 97%   BMI 36.88 kg/m   BP Readings from Last 3 Encounters:  02/12/22 (!) 158/92  12/31/21 (!) 160/98  03/25/21 138/84    Wt Readings from Last 3 Encounters:  02/12/22 257 lb (116.6 kg)  12/31/21 261 lb 2 oz (118.4 kg)  03/25/21 257 lb (116.6 kg)    Physical Exam Vitals reviewed.  Constitutional:      General: He is not in acute distress.  Appearance: He is not ill-appearing, toxic-appearing or diaphoretic.  HENT:     Mouth/Throat:     Mouth: Mucous membranes are moist.  Eyes:     General: No scleral icterus.    Conjunctiva/sclera: Conjunctivae normal.  Cardiovascular:     Rate and Rhythm: Normal rate and regular rhythm.     Heart sounds: No murmur heard.    No friction rub. No gallop.  Pulmonary:     Effort: No respiratory distress.     Breath sounds: No stridor. Examination of the right-lower field reveals rales. Rales present. No wheezing or rhonchi.  Chest:     Chest wall: No tenderness.  Abdominal:     General: Abdomen is flat.     Palpations: There is no mass.     Tenderness: There is no  abdominal tenderness. There is no guarding or rebound.     Hernia: No hernia is present.  Musculoskeletal:     Cervical back: Neck supple.     Right lower leg: 3+ Edema present.     Left lower leg: 1+ Edema present.  Lymphadenopathy:     Cervical: No cervical adenopathy.  Skin:    General: Skin is warm and dry.  Neurological:     General: No focal deficit present.     Mental Status: He is alert.  Psychiatric:        Mood and Affect: Mood normal.     Lab Results  Component Value Date   WBC 5.8 02/12/2022   HGB 16.0 02/12/2022   HCT 47.7 02/12/2022   PLT 173.0 02/12/2022   GLUCOSE 83 02/12/2022   CHOL 147 02/12/2022   TRIG 144.0 02/12/2022   HDL 50.90 02/12/2022   LDLDIRECT 65.0 10/17/2019   LDLCALC 67 02/12/2022   ALT 15 02/12/2022   AST 23 02/12/2022   NA 140 02/12/2022   K 3.8 02/12/2022   CL 99 02/12/2022   CREATININE 1.09 02/12/2022   BUN 15 02/12/2022   CO2 33 (H) 02/12/2022   TSH 3.89 02/12/2022   PSA 2.59 02/12/2022   INR 1.1 (H) 01/21/2021   HGBA1C 6.2 02/12/2022    DG Chest 2 View  Result Date: 06/10/2018 CLINICAL DATA:  Cough, congestion and shortness of breath EXAM: CHEST - 2 VIEW COMPARISON:  Chest x-ray 11/25/2017 and chest CT 03/24/2018 FINDINGS: The cardiac silhouette, mediastinal and hilar contours are within normal limits and stable. There is moderate tortuosity of the thoracic aorta. The right costophrenic angle is cut off the bottom edge of the film. No definite infiltrates, edema or pulmonary lesions. Stable left pleural thickening and evidence of remote left-sided thoracic trauma. The bony thorax is intact. Stable degenerative changes involving the thoracic spine. IMPRESSION: No acute cardiopulmonary findings. Remote posttraumatic changes involving the left hemithorax. Electronically Signed   By: Marijo Sanes M.D.   On: 06/10/2018 15:00    Assessment & Plan:   [name] was seen today for annual exam, hypertension and hyperlipidemia.  Diagnoses  and all orders for this visit:  Flu vaccine need -     Flu Vaccine QUAD High Dose(Fluad)  Essential hypertension- His BP is well controlled. -     Basic metabolic panel; Future -     Hepatic function panel; Future -     EKG 12-Lead -     Hepatic function panel -     Basic metabolic panel  Hypothyroidism, unspecified type- He is euthyroid. -     TSH; Future -     TSH  Benign prostatic hyperplasia without lower urinary tract symptoms -     Urinalysis, Routine w reflex microscopic; Future -     PSA; Future -     PSA -     Urinalysis, Routine w reflex microscopic  Dyslipidemia, goal LDL below 100- LDL goal achieved. Doing well on the statin  -     Lipid panel; Future -     Hepatic function panel; Future -     Hepatic function panel -     Lipid panel  Dietary folate deficiency anemia -     CBC with Differential/Platelet; Future -     Folate; Future -     Vitamin B12; Future -     Vitamin B12 -     Folate -     CBC with Differential/Platelet  B12 deficiency -     CBC with Differential/Platelet; Future -     Folate; Future -     Vitamin B12; Future -     Vitamin B12 -     Folate -     CBC with Differential/Platelet  Prediabetes -     Hemoglobin A1c; Future -     Hemoglobin A1c  NASH (nonalcoholic steatohepatitis)- LFT's are normal.  Leg edema, right- Labs are reassuring. I think the SOB is conditioning/obesity. -     Troponin I (High Sensitivity); Future -     Brain natriuretic peptide; Future -     D-dimer, quantitative; Future -     D-dimer, quantitative -     Brain natriuretic peptide -     Troponin I (High Sensitivity)   I am having Kurt Blair maintain his Cholecalciferol, (Menthol, Topical Analgesic, (ICY HOT PAIN RELIEVING EX)), multivitamin, levalbuterol, acetaminophen, atorvastatin, olmesartan, albuterol, torsemide, folic acid, Budeson-Glycopyrrol-Formoterol, and carvedilol.  No orders of the defined types were placed in this  encounter.    Follow-up: Return in about 3 months (around 05/15/2022).  Scarlette Calico, MD

## 2022-02-12 NOTE — Patient Instructions (Signed)
Health Maintenance, Male Adopting a healthy lifestyle and getting preventive care are important in promoting health and wellness. Ask your health care provider about: The right schedule for you to have regular tests and exams. Things you can do on your own to prevent diseases and keep yourself healthy. What should I know about diet, weight, and exercise? Eat a healthy diet  Eat a diet that includes plenty of vegetables, fruits, low-fat dairy products, and lean protein. Do not eat a lot of foods that are high in solid fats, added sugars, or sodium. Maintain a healthy weight Body mass index (BMI) is a measurement that can be used to identify possible weight problems. It estimates body fat based on height and weight. Your health care provider can help determine your BMI and help you achieve or maintain a healthy weight. Get regular exercise Get regular exercise. This is one of the most important things you can do for your health. Most adults should: Exercise for at least 150 minutes each week. The exercise should increase your heart rate and make you sweat (moderate-intensity exercise). Do strengthening exercises at least twice a week. This is in addition to the moderate-intensity exercise. Spend less time sitting. Even light physical activity can be beneficial. Watch cholesterol and blood lipids Have your blood tested for lipids and cholesterol at 75 years of age, then have this test every 5 years. You may need to have your cholesterol levels checked more often if: Your lipid or cholesterol levels are high. You are older than 75 years of age. You are at high risk for heart disease. What should I know about cancer screening? Many types of cancers can be detected early and may often be prevented. Depending on your health history and family history, you may need to have cancer screening at various ages. This may include screening for: Colorectal cancer. Prostate cancer. Skin cancer. Lung  cancer. What should I know about heart disease, diabetes, and high blood pressure? Blood pressure and heart disease High blood pressure causes heart disease and increases the risk of stroke. This is more likely to develop in people who have high blood pressure readings or are overweight. Talk with your health care provider about your target blood pressure readings. Have your blood pressure checked: Every 3-5 years if you are 18-39 years of age. Every year if you are 40 years old or older. If you are between the ages of 65 and 75 and are a current or former smoker, ask your health care provider if you should have a one-time screening for abdominal aortic aneurysm (AAA). Diabetes Have regular diabetes screenings. This checks your fasting blood sugar level. Have the screening done: Once every three years after age 45 if you are at a normal weight and have a low risk for diabetes. More often and at a younger age if you are overweight or have a high risk for diabetes. What should I know about preventing infection? Hepatitis B If you have a higher risk for hepatitis B, you should be screened for this virus. Talk with your health care provider to find out if you are at risk for hepatitis B infection. Hepatitis C Blood testing is recommended for: Everyone born from 1945 through 1965. Anyone with known risk factors for hepatitis C. Sexually transmitted infections (STIs) You should be screened each year for STIs, including gonorrhea and chlamydia, if: You are sexually active and are younger than 75 years of age. You are older than 75 years of age and your   health care provider tells you that you are at risk for this type of infection. Your sexual activity has changed since you were last screened, and you are at increased risk for chlamydia or gonorrhea. Ask your health care provider if you are at risk. Ask your health care provider about whether you are at high risk for HIV. Your health care provider  may recommend a prescription medicine to help prevent HIV infection. If you choose to take medicine to prevent HIV, you should first get tested for HIV. You should then be tested every 3 months for as long as you are taking the medicine. Follow these instructions at home: Alcohol use Do not drink alcohol if your health care provider tells you not to drink. If you drink alcohol: Limit how much you have to 0-2 drinks a day. Know how much alcohol is in your drink. In the [place] one drink equals one 12 oz bottle of beer (355 mL), one 5 oz glass of wine (148 mL), or one 1 oz glass of hard liquor (44 mL). Lifestyle Do not use any products that contain nicotine or tobacco. These products include cigarettes, chewing tobacco, and vaping devices, such as e-cigarettes. If you need help quitting, ask your health care provider. Do not use street drugs. Do not share needles. Ask your health care provider for help if you need support or information about quitting drugs. General instructions Schedule regular health, dental, and eye exams. Stay current with your vaccines. Tell your health care provider if: You often feel depressed. You have ever been abused or do not feel safe at home. Summary Adopting a healthy lifestyle and getting preventive care are important in promoting health and wellness. Follow your health care provider's instructions about healthy diet, exercising, and getting tested or screened for diseases. Follow your health care provider's instructions on monitoring your cholesterol and blood pressure. This information is not intended to replace advice given to you by your health care provider. Make sure you discuss any questions you have with your health care provider. Document Revised: 09/16/2020 Document Reviewed: 09/16/2020 Elsevier Patient Education  2023 [employer]  

## 2022-02-15 ENCOUNTER — Other Ambulatory Visit: Payer: Self-pay | Admitting: Internal Medicine

## 2022-02-15 DIAGNOSIS — L240 Irritant contact dermatitis due to detergents: Secondary | ICD-10-CM

## 2022-02-16 ENCOUNTER — Encounter: Payer: Self-pay | Admitting: Internal Medicine

## 2022-02-16 NOTE — Assessment & Plan Note (Signed)
Exam completed Labs reviewed Vaccines reviewed Cancer screenings are UTD Pt ed material was given

## 2022-02-23 ENCOUNTER — Telehealth: Payer: Medicare Other

## 2022-02-24 ENCOUNTER — Telehealth: Payer: Self-pay

## 2022-02-24 NOTE — Telephone Encounter (Signed)
Patient called and asked about lab results, advised message below. Patient had no questions.

## 2022-02-24 NOTE — Telephone Encounter (Signed)
Noted  

## 2022-04-15 ENCOUNTER — Telehealth: Payer: Self-pay | Admitting: Internal Medicine

## 2022-04-16 NOTE — Telephone Encounter (Signed)
Called and spoke with pt's spouse stating to her to reach out to our medical records dept to obtain pt's records from office visits pt has had with Dr. Melvyn Novas and she verbalized understanding. Phone number provided to her for medical records. Nothing further needed.

## 2022-04-25 ENCOUNTER — Other Ambulatory Visit: Payer: Self-pay | Admitting: Internal Medicine

## 2022-04-25 ENCOUNTER — Other Ambulatory Visit: Payer: Self-pay | Admitting: Emergency Medicine

## 2022-04-25 DIAGNOSIS — I5030 Unspecified diastolic (congestive) heart failure: Secondary | ICD-10-CM

## 2022-04-25 DIAGNOSIS — I10 Essential (primary) hypertension: Secondary | ICD-10-CM

## 2022-04-25 DIAGNOSIS — E785 Hyperlipidemia, unspecified: Secondary | ICD-10-CM

## 2022-04-26 ENCOUNTER — Other Ambulatory Visit: Payer: Self-pay | Admitting: Internal Medicine

## 2022-04-26 DIAGNOSIS — I5030 Unspecified diastolic (congestive) heart failure: Secondary | ICD-10-CM

## 2022-04-26 DIAGNOSIS — I10 Essential (primary) hypertension: Secondary | ICD-10-CM

## 2022-05-26 DIAGNOSIS — R42 Dizziness and giddiness: Secondary | ICD-10-CM | POA: Diagnosis not present

## 2022-05-27 DIAGNOSIS — S060X1S Concussion with loss of consciousness of 30 minutes or less, sequela: Secondary | ICD-10-CM | POA: Diagnosis not present

## 2022-05-27 DIAGNOSIS — G479 Sleep disorder, unspecified: Secondary | ICD-10-CM | POA: Diagnosis not present

## 2022-05-27 DIAGNOSIS — F1090 Alcohol use, unspecified, uncomplicated: Secondary | ICD-10-CM | POA: Diagnosis not present

## 2022-05-27 DIAGNOSIS — F809 Developmental disorder of speech and language, unspecified: Secondary | ICD-10-CM | POA: Diagnosis not present

## 2022-05-27 DIAGNOSIS — Z87820 Personal history of traumatic brain injury: Secondary | ICD-10-CM | POA: Diagnosis not present

## 2022-05-27 DIAGNOSIS — Z720 Tobacco use: Secondary | ICD-10-CM | POA: Diagnosis not present

## 2022-05-27 DIAGNOSIS — Z655 Exposure to disaster, war and other hostilities: Secondary | ICD-10-CM | POA: Diagnosis not present

## 2022-05-28 DIAGNOSIS — H8110 Benign paroxysmal vertigo, unspecified ear: Secondary | ICD-10-CM | POA: Diagnosis not present

## 2022-05-28 DIAGNOSIS — M25511 Pain in right shoulder: Secondary | ICD-10-CM | POA: Diagnosis not present

## 2022-05-28 DIAGNOSIS — G8929 Other chronic pain: Secondary | ICD-10-CM | POA: Diagnosis not present

## 2022-06-02 DIAGNOSIS — R051 Acute cough: Secondary | ICD-10-CM | POA: Diagnosis not present

## 2022-06-02 DIAGNOSIS — I10 Essential (primary) hypertension: Secondary | ICD-10-CM | POA: Diagnosis not present

## 2022-06-02 DIAGNOSIS — Z03818 Encounter for observation for suspected exposure to other biological agents ruled out: Secondary | ICD-10-CM | POA: Diagnosis not present

## 2022-06-02 DIAGNOSIS — Z20822 Contact with and (suspected) exposure to covid-19: Secondary | ICD-10-CM | POA: Diagnosis not present

## 2022-07-18 ENCOUNTER — Other Ambulatory Visit: Payer: Self-pay | Admitting: Internal Medicine

## 2022-07-18 DIAGNOSIS — I5030 Unspecified diastolic (congestive) heart failure: Secondary | ICD-10-CM

## 2022-07-18 DIAGNOSIS — I10 Essential (primary) hypertension: Secondary | ICD-10-CM

## 2022-07-19 ENCOUNTER — Other Ambulatory Visit: Payer: Self-pay | Admitting: Internal Medicine

## 2022-07-19 DIAGNOSIS — D520 Dietary folate deficiency anemia: Secondary | ICD-10-CM

## 2022-07-27 ENCOUNTER — Telehealth: Payer: Self-pay | Admitting: Internal Medicine

## 2022-07-27 NOTE — Telephone Encounter (Signed)
Patient is having medication issues with Breztri. They did not elaborate very well and I am unsure what the issue is. They originally called and asked to speak with a pharmacist. They would like a call back at [phone].

## 2022-07-27 NOTE — Telephone Encounter (Signed)
Spoke with pt and he made an appt with pulmonary to talk about the medication Breztri.

## 2022-07-29 ENCOUNTER — Other Ambulatory Visit: Payer: Self-pay | Admitting: Internal Medicine

## 2022-07-29 DIAGNOSIS — I5030 Unspecified diastolic (congestive) heart failure: Secondary | ICD-10-CM

## 2022-07-29 DIAGNOSIS — I10 Essential (primary) hypertension: Secondary | ICD-10-CM

## 2022-07-29 NOTE — Telephone Encounter (Signed)
Patient made appointment for 08/24/2022 - patient wants to know if you will send enough medication to last until the appointment.  He is out of his medication. (Carvedilol)

## 2022-07-30 ENCOUNTER — Telehealth: Payer: Self-pay | Admitting: Internal Medicine

## 2022-07-30 NOTE — Telephone Encounter (Signed)
Pt called in about this Particular Medication Diveilol. I don't see this medication anywhere on the med list can some one please call pt wife back about her husband medication. Wife stated pt doesn't feel well.  [phone]

## 2022-07-30 NOTE — Telephone Encounter (Signed)
Patient called back and said he has been out of his medication for three days. When informed that we were waiting on provider's response, the patient got upset and hung up.

## 2022-07-30 NOTE — Telephone Encounter (Signed)
Patient would like a call back to discuss getting a temporary fill of the medication prior to his appointment on 3/25. Best call back is [phone].

## 2022-07-30 NOTE — Telephone Encounter (Signed)
Pt has been scheduled for 3/25 @ 11.20am for a follow up as he is overdue.

## 2022-08-03 ENCOUNTER — Other Ambulatory Visit: Payer: Self-pay | Admitting: Internal Medicine

## 2022-08-03 ENCOUNTER — Ambulatory Visit (INDEPENDENT_AMBULATORY_CARE_PROVIDER_SITE_OTHER): Payer: Medicare Other | Admitting: Internal Medicine

## 2022-08-03 ENCOUNTER — Ambulatory Visit (INDEPENDENT_AMBULATORY_CARE_PROVIDER_SITE_OTHER): Payer: Medicare Other

## 2022-08-03 ENCOUNTER — Encounter: Payer: Self-pay | Admitting: Internal Medicine

## 2022-08-03 ENCOUNTER — Telehealth: Payer: Self-pay

## 2022-08-03 VITALS — BP 162/82 | HR 83 | Temp 98.2°F | Ht 70.0 in | Wt 268.0 lb

## 2022-08-03 DIAGNOSIS — D751 Secondary polycythemia: Secondary | ICD-10-CM | POA: Diagnosis not present

## 2022-08-03 DIAGNOSIS — I5030 Unspecified diastolic (congestive) heart failure: Secondary | ICD-10-CM | POA: Diagnosis not present

## 2022-08-03 DIAGNOSIS — R053 Chronic cough: Secondary | ICD-10-CM

## 2022-08-03 DIAGNOSIS — R7303 Prediabetes: Secondary | ICD-10-CM

## 2022-08-03 DIAGNOSIS — R059 Cough, unspecified: Secondary | ICD-10-CM | POA: Diagnosis not present

## 2022-08-03 DIAGNOSIS — R9431 Abnormal electrocardiogram [ECG] [EKG]: Secondary | ICD-10-CM

## 2022-08-03 DIAGNOSIS — E039 Hypothyroidism, unspecified: Secondary | ICD-10-CM

## 2022-08-03 DIAGNOSIS — K7581 Nonalcoholic steatohepatitis (NASH): Secondary | ICD-10-CM

## 2022-08-03 DIAGNOSIS — R0602 Shortness of breath: Secondary | ICD-10-CM | POA: Diagnosis not present

## 2022-08-03 DIAGNOSIS — I10 Essential (primary) hypertension: Secondary | ICD-10-CM

## 2022-08-03 DIAGNOSIS — E538 Deficiency of other specified B group vitamins: Secondary | ICD-10-CM | POA: Diagnosis not present

## 2022-08-03 DIAGNOSIS — F17200 Nicotine dependence, unspecified, uncomplicated: Secondary | ICD-10-CM

## 2022-08-03 DIAGNOSIS — R0609 Other forms of dyspnea: Secondary | ICD-10-CM

## 2022-08-03 LAB — HEPATIC FUNCTION PANEL
ALT: 11 U/L (ref 0–53)
AST: 20 U/L (ref 0–37)
Albumin: 4.2 g/dL (ref 3.5–5.2)
Alkaline Phosphatase: 63 U/L (ref 39–117)
Bilirubin, Direct: 0.3 mg/dL (ref 0.0–0.3)
Total Bilirubin: 1.3 mg/dL — ABNORMAL HIGH (ref 0.2–1.2)
Total Protein: 7.7 g/dL (ref 6.0–8.3)

## 2022-08-03 LAB — URINALYSIS, ROUTINE W REFLEX MICROSCOPIC
Hgb urine dipstick: NEGATIVE
Ketones, ur: NEGATIVE
Leukocytes,Ua: NEGATIVE
Nitrite: NEGATIVE
Specific Gravity, Urine: 1.02 (ref 1.000–1.030)
Total Protein, Urine: 100 — AB
Urine Glucose: NEGATIVE
Urobilinogen, UA: 4 — AB (ref 0.0–1.0)
pH: 7 (ref 5.0–8.0)

## 2022-08-03 LAB — CBC WITH DIFFERENTIAL/PLATELET
Basophils Absolute: 0 10*3/uL (ref 0.0–0.1)
Basophils Relative: 0.7 % (ref 0.0–3.0)
Eosinophils Absolute: 0.2 10*3/uL (ref 0.0–0.7)
Eosinophils Relative: 2.6 % (ref 0.0–5.0)
HCT: 53.4 % — ABNORMAL HIGH (ref 39.0–52.0)
Hemoglobin: 18.3 g/dL (ref 13.0–17.0)
Lymphocytes Relative: 24.5 % (ref 12.0–46.0)
Lymphs Abs: 1.6 10*3/uL (ref 0.7–4.0)
MCHC: 34.2 g/dL (ref 30.0–36.0)
MCV: 90.9 fl (ref 78.0–100.0)
Monocytes Absolute: 0.5 10*3/uL (ref 0.1–1.0)
Monocytes Relative: 8.6 % (ref 3.0–12.0)
Neutro Abs: 4.1 10*3/uL (ref 1.4–7.7)
Neutrophils Relative %: 63.6 % (ref 43.0–77.0)
Platelets: 185 10*3/uL (ref 150.0–400.0)
RBC: 5.88 Mil/uL — ABNORMAL HIGH (ref 4.22–5.81)
RDW: 14.9 % (ref 11.5–15.5)
WBC: 6.4 10*3/uL (ref 4.0–10.5)

## 2022-08-03 LAB — BASIC METABOLIC PANEL
BUN: 12 mg/dL (ref 6–23)
CO2: 39 mEq/L — ABNORMAL HIGH (ref 19–32)
Calcium: 9.9 mg/dL (ref 8.4–10.5)
Chloride: 95 mEq/L — ABNORMAL LOW (ref 96–112)
Creatinine, Ser: 1.01 mg/dL (ref 0.40–1.50)
GFR: 72.4 mL/min (ref >60.00)
Glucose, Bld: 95 mg/dL (ref 70–99)
Potassium: 4 mEq/L (ref 3.5–5.1)
Sodium: 140 mEq/L (ref 135–145)

## 2022-08-03 LAB — HEMOGLOBIN A1C: Hgb A1c MFr Bld: 6.4 % (ref 4.6–6.5)

## 2022-08-03 MED ORDER — CARVEDILOL 12.5 MG PO TABS: 12.5000 mg | ORAL_TABLET | Freq: Two times a day (BID) | ORAL | 0 refills | Status: DC

## 2022-08-03 NOTE — Progress Notes (Signed)
Subjective:  Patient ID: Kurt Blair, male    DOB: [birth year]  Age: 77 y.o. MRN: [id number]  CC: Hypertension, Cough, COPD, and Congestive Heart Failure   HPI Kurt Blair presents for f/up ---  He tells me that his shortness of breath has not changed over the last 10 years.  He has mild intermittent nonproductive cough with lower extremity edema.  He denies chest pain or diaphoresis.  Outpatient Medications Prior to Visit  Medication Sig Dispense Refill   acetaminophen (TYLENOL) 650 MG CR tablet Take 650 mg by mouth every 8 (eight) hours as needed.     albuterol (VENTOLIN HFA) 108 (90 Base) MCG/ACT inhaler TAKE 2 PUFFS BY MOUTH EVERY 6 HOURS AS NEEDED FOR WHEEZE OR SHORTNESS OF BREATH 6.7 each 5   atorvastatin (LIPITOR) 80 MG tablet TAKE 1 TABLET BY MOUTH EVERY DAY 90 tablet 1   Budeson-Glycopyrrol-Formoterol 160-9-4.8 MCG/ACT AERO Inhale 2 puffs into the lungs in the morning and at bedtime. 32.1 g 1   Cholecalciferol 50 MCG (2000 UT) TABS Take 1 tablet (2,000 Units total) by mouth daily. 90 tablet 1   folic acid (FOLVITE) 1 MG tablet TAKE 1 TABLET BY MOUTH EVERY DAY 90 tablet 1   KLOR-CON M20 20 MEQ tablet TAKE 1 TABLET BY MOUTH TWICE A DAY 180 tablet 1   levalbuterol (XOPENEX HFA) 45 MCG/ACT inhaler Inhale 1 puff into the lungs every 6 (six) hours as needed for wheezing. 45 g 2   levocetirizine (XYZAL) 5 MG tablet TAKE 1 TABLET BY MOUTH EVERY DAY IN THE EVENING 90 tablet 1   Menthol, Topical Analgesic, (ICY HOT PAIN RELIEVING EX) Apply topically daily as needed.     Multiple Vitamin (MULTIVITAMIN) tablet Take 1 tablet by mouth daily. Centrum silver - mens 50+     olmesartan (BENICAR) 40 MG tablet TAKE 1 TABLET BY MOUTH EVERY DAY 90 tablet 1   torsemide (DEMADEX) 20 MG tablet TAKE 1 TABLET BY MOUTH TWICE A DAY 180 tablet 1   carvedilol (COREG) 12.5 MG tablet TAKE 1 TABLET (12.5MG  TOTAL) BY MOUTH TWICE A DAY WITH MEALS 180 tablet 0   No facility-administered medications  prior to visit.    ROS Review of Systems  Constitutional:  Positive for fatigue. Negative for appetite change, diaphoresis and unexpected weight change.  HENT: Negative.    Eyes: Negative.   Respiratory:  Positive for cough and shortness of breath. Negative for chest tightness and wheezing.   Cardiovascular:  Positive for leg swelling. Negative for chest pain and palpitations.  Gastrointestinal:  Negative for abdominal pain, constipation, diarrhea, nausea and vomiting.  Endocrine: Negative.   Genitourinary: Negative.  Negative for difficulty urinating and dysuria.  Musculoskeletal: Negative.  Negative for arthralgias and myalgias.  Skin: Negative.   Neurological:  Negative for dizziness and weakness.  Hematological:  Negative for adenopathy. Does not bruise/bleed easily.  Psychiatric/Behavioral: Negative.      Objective:  BP (!) 162/82 (BP Location: Right Arm, Patient Position: Sitting, Cuff Size: Large)   Pulse 83   Temp 98.2 F (36.8 C) (Oral)   Ht 5\' 10"  (1.778 m)   Wt 268 lb (121.6 kg)   SpO2 92%   BMI 38.45 kg/m   BP Readings from Last 3 Encounters:  08/03/22 (!) 162/82  02/12/22 (!) 158/92  12/31/21 (!) 160/98    Wt Readings from Last 3 Encounters:  08/03/22 268 lb (121.6 kg)  02/12/22 257 lb (116.6 kg)  12/31/21 261 lb  2 oz (118.4 kg)    Physical Exam Vitals reviewed.  Constitutional:      General: He is not in acute distress.    Appearance: He is ill-appearing. He is not diaphoretic.  HENT:     Nose: Nose normal.     Mouth/Throat:     Mouth: Mucous membranes are moist.  Eyes:     General: No scleral icterus.    Conjunctiva/sclera: Conjunctivae normal.  Cardiovascular:     Rate and Rhythm: Normal rate and regular rhythm. Occasional Extrasystoles are present.    Heart sounds: No murmur heard.    Gallop present.     Comments: EKG- SR with marked SA and 1st degree AV block with occasional PAC LAD Anterior infarct pattern Changed compared to the prior  EKG Pulmonary:     Effort: Tachypnea present. No accessory muscle usage or respiratory distress.     Breath sounds: Examination of the right-lower field reveals rales. Examination of the left-lower field reveals rales. Rales present. No decreased breath sounds, wheezing or rhonchi.  Abdominal:     General: Abdomen is protuberant. Bowel sounds are normal. There is no distension.     Palpations: Abdomen is soft. There is no hepatomegaly, splenomegaly or mass.     Tenderness: There is no abdominal tenderness. There is no guarding.  Musculoskeletal:     Cervical back: Neck supple.     Right lower leg: 2+ Pitting Edema present.     Left lower leg: 2+ Pitting Edema present.  Lymphadenopathy:     Cervical: No cervical adenopathy.  Skin:    General: Skin is warm and dry.  Neurological:     General: No focal deficit present.     Mental Status: He is alert. Mental status is at baseline.     Lab Results  Component Value Date   WBC 6.4 08/03/2022   HGB 18.3 Repeated and verified X2. (HH) 08/03/2022   HCT 53.4 (H) 08/03/2022   PLT 185.0 08/03/2022   GLUCOSE 95 08/03/2022   CHOL 147 02/12/2022   TRIG 144.0 02/12/2022   HDL 50.90 02/12/2022   LDLDIRECT 65.0 10/17/2019   LDLCALC 67 02/12/2022   ALT 11 08/03/2022   AST 20 08/03/2022   NA 140 08/03/2022   K 4.0 08/03/2022   CL 95 (L) 08/03/2022   CREATININE 1.01 08/03/2022   BUN 12 08/03/2022   CO2 39 (H) 08/03/2022   TSH 4.63 08/03/2022   PSA 2.59 02/12/2022   INR 1.1 (H) 01/21/2021   HGBA1C 6.4 08/03/2022    DG Chest 2 View  Result Date: 06/10/2018 CLINICAL DATA:  Cough, congestion and shortness of breath EXAM: CHEST - 2 VIEW COMPARISON:  Chest x-ray 11/25/2017 and chest CT 03/24/2018 FINDINGS: The cardiac silhouette, mediastinal and hilar contours are within normal limits and stable. There is moderate tortuosity of the thoracic aorta. The right costophrenic angle is cut off the bottom edge of the film. No definite infiltrates,  edema or pulmonary lesions. Stable left pleural thickening and evidence of remote left-sided thoracic trauma. The bony thorax is intact. Stable degenerative changes involving the thoracic spine. IMPRESSION: No acute cardiopulmonary findings. Remote posttraumatic changes involving the left hemithorax. Electronically Signed   By: Marijo Sanes M.D.   On: 06/10/2018 15:00    DG Chest 2 View  Result Date: 08/03/2022 CLINICAL DATA:  Cough and shortness of breath. Lung surgery 30 years ago. EXAM: CHEST - 2 VIEW COMPARISON:  06/10/2018 FINDINGS: Lungs are adequately inflated without focal airspace consolidation  or effusion. Mild stable elevation of the left hemidiaphragm. Cardiomediastinal silhouette and remainder of the exam is unchanged. IMPRESSION: No active cardiopulmonary disease. Electronically Signed   By: Marin Olp M.D.   On: 08/03/2022 12:19     Assessment & Plan:  B12 deficiency -     CBC with Differential/Platelet; Future -     Vitamin B12; Future -     Folate; Future  Essential hypertension-his blood pressure is not adequately well-controlled.  Will restart carvedilol. -     EKG 12-Lead -     Urinalysis, Routine w reflex microscopic; Future -     Basic metabolic panel; Future -     Carvedilol; Take 1 tablet (12.5 mg total) by mouth 2 (two) times daily with a meal.  Dispense: 180 tablet; Refill: 0  Prediabetes-A1c is up to 6.4%. -     Hemoglobin A1c; Future -     Basic metabolic panel; Future  Chronic cough-chest x-ray is negative for mass or infiltrate. -     DG Chest 2 View; Future  DOE (dyspnea on exertion) -     Troponin T, High Sensitivity (hs-TnT); Future -     Brain natriuretic peptide; Future  Abnormal electrocardiogram (ECG) (EKG)-  -     Troponin T, High Sensitivity (hs-TnT); Future -     Brain natriuretic peptide; Future -     Ambulatory referral to Cardiology  CHF with left ventricular diastolic dysfunction, NYHA class 1 (HCC) -     Carvedilol; Take 1 tablet  (12.5 mg total) by mouth 2 (two) times daily with a meal.  Dispense: 180 tablet; Refill: 0 -     Ambulatory referral to Cardiology  Hypothyroidism, unspecified type- He is euthyroid. -     TSH; Future  Morbid obesity due to excess calories complicated by hbp/ hyperlipidemia  -     TSH; Future -     Hepatic function panel; Future  NASH (nonalcoholic steatohepatitis)-  -     Hepatic function panel; Future  SMOKER  Erythrocytosis -     Ambulatory referral to Hematology / Oncology     Follow-up: No follow-ups on file.  Scarlette Calico, MD

## 2022-08-03 NOTE — Telephone Encounter (Signed)
CRITICAL VALUE STICKER  CRITICAL VALUE: Hemoglobin 18.3  RECEIVER (on-site recipient of call):Judd Mccubbin  DATE & TIME NOTIFIED: 08/03/22 4:00pm  MESSENGER (representative from lab): Cam  MD NOTIFIED:   TIME OF NOTIFICATION: 4:12pm  RESPONSE: ok

## 2022-08-03 NOTE — Telephone Encounter (Signed)
Patient would like his carvedilol sent in.

## 2022-08-04 ENCOUNTER — Telehealth: Payer: Self-pay | Admitting: Internal Medicine

## 2022-08-04 DIAGNOSIS — D751 Secondary polycythemia: Secondary | ICD-10-CM | POA: Insufficient documentation

## 2022-08-04 LAB — TSH: TSH: 4.63 u[IU]/mL (ref 0.35–5.50)

## 2022-08-04 LAB — FOLATE: Folate: >23.8 ng/mL (ref >5.9)

## 2022-08-04 LAB — VITAMIN B12: Vitamin B-12: 256 pg/mL (ref 211–911)

## 2022-08-04 NOTE — Telephone Encounter (Signed)
Pt called back wanting to know his results for his lab work.  Pt sated he would like a call back today

## 2022-08-05 NOTE — Telephone Encounter (Signed)
Called pt, LVM.   

## 2022-08-10 ENCOUNTER — Emergency Department (HOSPITAL_COMMUNITY): Payer: Medicare Other

## 2022-08-10 ENCOUNTER — Inpatient Hospital Stay (HOSPITAL_COMMUNITY)
Admission: EM | Admit: 2022-08-10 | Discharge: 2022-09-09 | DRG: 207 | Disposition: E | Payer: Medicare Other | Attending: Internal Medicine | Admitting: Internal Medicine

## 2022-08-10 ENCOUNTER — Inpatient Hospital Stay (HOSPITAL_COMMUNITY): Payer: Medicare Other

## 2022-08-10 DIAGNOSIS — M7989 Other specified soft tissue disorders: Secondary | ICD-10-CM | POA: Diagnosis not present

## 2022-08-10 DIAGNOSIS — J9690 Respiratory failure, unspecified, unspecified whether with hypoxia or hypercapnia: Secondary | ICD-10-CM | POA: Diagnosis not present

## 2022-08-10 DIAGNOSIS — I428 Other cardiomyopathies: Secondary | ICD-10-CM | POA: Diagnosis not present

## 2022-08-10 DIAGNOSIS — Z87891 Personal history of nicotine dependence: Secondary | ICD-10-CM

## 2022-08-10 DIAGNOSIS — Z1152 Encounter for screening for COVID-19: Secondary | ICD-10-CM | POA: Diagnosis not present

## 2022-08-10 DIAGNOSIS — Z515 Encounter for palliative care: Secondary | ICD-10-CM

## 2022-08-10 DIAGNOSIS — J9601 Acute respiratory failure with hypoxia: Secondary | ICD-10-CM | POA: Diagnosis present

## 2022-08-10 DIAGNOSIS — R451 Restlessness and agitation: Secondary | ICD-10-CM | POA: Diagnosis not present

## 2022-08-10 DIAGNOSIS — K5730 Diverticulosis of large intestine without perforation or abscess without bleeding: Secondary | ICD-10-CM | POA: Diagnosis not present

## 2022-08-10 DIAGNOSIS — E875 Hyperkalemia: Secondary | ICD-10-CM | POA: Diagnosis present

## 2022-08-10 DIAGNOSIS — N170 Acute kidney failure with tubular necrosis: Secondary | ICD-10-CM | POA: Diagnosis not present

## 2022-08-10 DIAGNOSIS — R404 Transient alteration of awareness: Secondary | ICD-10-CM | POA: Diagnosis not present

## 2022-08-10 DIAGNOSIS — K7581 Nonalcoholic steatohepatitis (NASH): Secondary | ICD-10-CM | POA: Diagnosis not present

## 2022-08-10 DIAGNOSIS — I2609 Other pulmonary embolism with acute cor pulmonale: Principal | ICD-10-CM | POA: Diagnosis present

## 2022-08-10 DIAGNOSIS — R140 Abdominal distension (gaseous): Secondary | ICD-10-CM | POA: Diagnosis not present

## 2022-08-10 DIAGNOSIS — I469 Cardiac arrest, cause unspecified: Secondary | ICD-10-CM | POA: Diagnosis not present

## 2022-08-10 DIAGNOSIS — R7303 Prediabetes: Secondary | ICD-10-CM | POA: Diagnosis present

## 2022-08-10 DIAGNOSIS — K567 Ileus, unspecified: Secondary | ICD-10-CM | POA: Diagnosis not present

## 2022-08-10 DIAGNOSIS — D62 Acute posthemorrhagic anemia: Secondary | ICD-10-CM | POA: Diagnosis not present

## 2022-08-10 DIAGNOSIS — Y838 Other surgical procedures as the cause of abnormal reaction of the patient, or of later complication, without mention of misadventure at the time of the procedure: Secondary | ICD-10-CM | POA: Diagnosis not present

## 2022-08-10 DIAGNOSIS — I82409 Acute embolism and thrombosis of unspecified deep veins of unspecified lower extremity: Secondary | ICD-10-CM | POA: Diagnosis not present

## 2022-08-10 DIAGNOSIS — K559 Vascular disorder of intestine, unspecified: Secondary | ICD-10-CM | POA: Diagnosis not present

## 2022-08-10 DIAGNOSIS — F419 Anxiety disorder, unspecified: Secondary | ICD-10-CM | POA: Diagnosis present

## 2022-08-10 DIAGNOSIS — S1093XA Contusion of unspecified part of neck, initial encounter: Secondary | ICD-10-CM | POA: Diagnosis not present

## 2022-08-10 DIAGNOSIS — Z452 Encounter for adjustment and management of vascular access device: Secondary | ICD-10-CM | POA: Diagnosis not present

## 2022-08-10 DIAGNOSIS — Z66 Do not resuscitate: Secondary | ICD-10-CM | POA: Diagnosis not present

## 2022-08-10 DIAGNOSIS — M96A3 Multiple fractures of ribs associated with chest compression and cardiopulmonary resuscitation: Secondary | ICD-10-CM | POA: Diagnosis not present

## 2022-08-10 DIAGNOSIS — Z7189 Other specified counseling: Secondary | ICD-10-CM | POA: Diagnosis not present

## 2022-08-10 DIAGNOSIS — R570 Cardiogenic shock: Secondary | ICD-10-CM | POA: Diagnosis not present

## 2022-08-10 DIAGNOSIS — E8729 Other acidosis: Secondary | ICD-10-CM | POA: Diagnosis not present

## 2022-08-10 DIAGNOSIS — E039 Hypothyroidism, unspecified: Secondary | ICD-10-CM | POA: Diagnosis not present

## 2022-08-10 DIAGNOSIS — R233 Spontaneous ecchymoses: Secondary | ICD-10-CM | POA: Diagnosis not present

## 2022-08-10 DIAGNOSIS — S27892A Contusion of other specified intrathoracic organs, initial encounter: Secondary | ICD-10-CM | POA: Diagnosis not present

## 2022-08-10 DIAGNOSIS — Z6841 Body Mass Index (BMI) 40.0 and over, adult: Secondary | ICD-10-CM | POA: Diagnosis not present

## 2022-08-10 DIAGNOSIS — K7200 Acute and subacute hepatic failure without coma: Secondary | ICD-10-CM | POA: Diagnosis present

## 2022-08-10 DIAGNOSIS — F4310 Post-traumatic stress disorder, unspecified: Secondary | ICD-10-CM | POA: Diagnosis present

## 2022-08-10 DIAGNOSIS — R55 Syncope and collapse: Secondary | ICD-10-CM | POA: Diagnosis not present

## 2022-08-10 DIAGNOSIS — I2690 Septic pulmonary embolism without acute cor pulmonale: Secondary | ICD-10-CM | POA: Diagnosis not present

## 2022-08-10 DIAGNOSIS — Z91040 Latex allergy status: Secondary | ICD-10-CM

## 2022-08-10 DIAGNOSIS — R053 Chronic cough: Secondary | ICD-10-CM | POA: Diagnosis present

## 2022-08-10 DIAGNOSIS — I5032 Chronic diastolic (congestive) heart failure: Secondary | ICD-10-CM | POA: Diagnosis not present

## 2022-08-10 DIAGNOSIS — J9811 Atelectasis: Secondary | ICD-10-CM | POA: Diagnosis present

## 2022-08-10 DIAGNOSIS — I82432 Acute embolism and thrombosis of left popliteal vein: Secondary | ICD-10-CM | POA: Diagnosis not present

## 2022-08-10 DIAGNOSIS — J449 Chronic obstructive pulmonary disease, unspecified: Secondary | ICD-10-CM | POA: Diagnosis not present

## 2022-08-10 DIAGNOSIS — K8020 Calculus of gallbladder without cholecystitis without obstruction: Secondary | ICD-10-CM | POA: Diagnosis not present

## 2022-08-10 DIAGNOSIS — T82838A Hemorrhage of vascular prosthetic devices, implants and grafts, initial encounter: Secondary | ICD-10-CM | POA: Diagnosis not present

## 2022-08-10 DIAGNOSIS — I5082 Biventricular heart failure: Secondary | ICD-10-CM | POA: Diagnosis present

## 2022-08-10 DIAGNOSIS — Z4682 Encounter for fitting and adjustment of non-vascular catheter: Secondary | ICD-10-CM | POA: Diagnosis not present

## 2022-08-10 DIAGNOSIS — Z743 Need for continuous supervision: Secondary | ICD-10-CM | POA: Diagnosis not present

## 2022-08-10 DIAGNOSIS — J984 Other disorders of lung: Secondary | ICD-10-CM | POA: Diagnosis not present

## 2022-08-10 DIAGNOSIS — Z9911 Dependence on respirator [ventilator] status: Secondary | ICD-10-CM

## 2022-08-10 DIAGNOSIS — I82412 Acute embolism and thrombosis of left femoral vein: Secondary | ICD-10-CM | POA: Diagnosis not present

## 2022-08-10 DIAGNOSIS — J9621 Acute and chronic respiratory failure with hypoxia: Secondary | ICD-10-CM | POA: Diagnosis not present

## 2022-08-10 DIAGNOSIS — E876 Hypokalemia: Secondary | ICD-10-CM | POA: Diagnosis not present

## 2022-08-10 DIAGNOSIS — J9602 Acute respiratory failure with hypercapnia: Secondary | ICD-10-CM | POA: Diagnosis not present

## 2022-08-10 DIAGNOSIS — Z79899 Other long term (current) drug therapy: Secondary | ICD-10-CM

## 2022-08-10 DIAGNOSIS — N19 Unspecified kidney failure: Secondary | ICD-10-CM | POA: Diagnosis not present

## 2022-08-10 DIAGNOSIS — Z86010 Personal history of colonic polyps: Secondary | ICD-10-CM

## 2022-08-10 DIAGNOSIS — K661 Hemoperitoneum: Secondary | ICD-10-CM | POA: Diagnosis not present

## 2022-08-10 DIAGNOSIS — J690 Pneumonitis due to inhalation of food and vomit: Secondary | ICD-10-CM | POA: Diagnosis present

## 2022-08-10 DIAGNOSIS — F1010 Alcohol abuse, uncomplicated: Secondary | ICD-10-CM | POA: Diagnosis present

## 2022-08-10 DIAGNOSIS — S2243XA Multiple fractures of ribs, bilateral, initial encounter for closed fracture: Secondary | ICD-10-CM | POA: Diagnosis not present

## 2022-08-10 DIAGNOSIS — I468 Cardiac arrest due to other underlying condition: Secondary | ICD-10-CM | POA: Diagnosis not present

## 2022-08-10 DIAGNOSIS — H1132 Conjunctival hemorrhage, left eye: Secondary | ICD-10-CM | POA: Diagnosis not present

## 2022-08-10 DIAGNOSIS — M7981 Nontraumatic hematoma of soft tissue: Secondary | ICD-10-CM | POA: Diagnosis not present

## 2022-08-10 DIAGNOSIS — I110 Hypertensive heart disease with heart failure: Secondary | ICD-10-CM | POA: Diagnosis present

## 2022-08-10 DIAGNOSIS — I499 Cardiac arrhythmia, unspecified: Secondary | ICD-10-CM | POA: Diagnosis not present

## 2022-08-10 DIAGNOSIS — R0602 Shortness of breath: Secondary | ICD-10-CM | POA: Diagnosis not present

## 2022-08-10 DIAGNOSIS — J4520 Mild intermittent asthma, uncomplicated: Secondary | ICD-10-CM | POA: Diagnosis present

## 2022-08-10 DIAGNOSIS — Z4659 Encounter for fitting and adjustment of other gastrointestinal appliance and device: Secondary | ICD-10-CM | POA: Diagnosis not present

## 2022-08-10 DIAGNOSIS — N179 Acute kidney failure, unspecified: Secondary | ICD-10-CM | POA: Diagnosis not present

## 2022-08-10 DIAGNOSIS — E785 Hyperlipidemia, unspecified: Secondary | ICD-10-CM | POA: Diagnosis present

## 2022-08-10 DIAGNOSIS — I2699 Other pulmonary embolism without acute cor pulmonale: Secondary | ICD-10-CM | POA: Diagnosis not present

## 2022-08-10 DIAGNOSIS — T45515A Adverse effect of anticoagulants, initial encounter: Secondary | ICD-10-CM | POA: Diagnosis not present

## 2022-08-10 DIAGNOSIS — K761 Chronic passive congestion of liver: Secondary | ICD-10-CM | POA: Diagnosis present

## 2022-08-10 DIAGNOSIS — J90 Pleural effusion, not elsewhere classified: Secondary | ICD-10-CM | POA: Diagnosis not present

## 2022-08-10 DIAGNOSIS — R34 Anuria and oliguria: Secondary | ICD-10-CM | POA: Diagnosis not present

## 2022-08-10 DIAGNOSIS — D6832 Hemorrhagic disorder due to extrinsic circulating anticoagulants: Secondary | ICD-10-CM | POA: Diagnosis not present

## 2022-08-10 LAB — COMPREHENSIVE METABOLIC PANEL
ALT: 46 U/L — ABNORMAL HIGH (ref 0–44)
AST: 94 U/L — ABNORMAL HIGH (ref 15–41)
Albumin: 2.6 g/dL — ABNORMAL LOW (ref 3.5–5.0)
Alkaline Phosphatase: 80 U/L (ref 38–126)
Anion gap: 16 — ABNORMAL HIGH (ref 5–15)
BUN: 22 mg/dL (ref 8–23)
CO2: 26 mmol/L (ref 22–32)
Calcium: 8.5 mg/dL — ABNORMAL LOW (ref 8.9–10.3)
Chloride: 96 mmol/L — ABNORMAL LOW (ref 98–111)
Creatinine, Ser: 1.89 mg/dL — ABNORMAL HIGH (ref 0.61–1.24)
GFR, Estimated: 36 mL/min — ABNORMAL LOW (ref >60)
Glucose, Bld: 190 mg/dL — ABNORMAL HIGH (ref 70–99)
Potassium: 6 mmol/L — ABNORMAL HIGH (ref 3.5–5.1)
Sodium: 138 mmol/L (ref 135–145)
Total Bilirubin: 1.2 mg/dL (ref 0.3–1.2)
Total Protein: 5.6 g/dL — ABNORMAL LOW (ref 6.5–8.1)

## 2022-08-10 LAB — COOXEMETRY PANEL
Carboxyhemoglobin: 2.7 % — ABNORMAL HIGH (ref 0.5–1.5)
Methemoglobin: <0.7 % (ref 0.0–1.5)
O2 Saturation: 74.8 %
Total hemoglobin: 16.1 g/dL — ABNORMAL HIGH (ref 12.0–16.0)

## 2022-08-10 LAB — ABO/RH: ABO/RH(D): AB POS

## 2022-08-10 LAB — GLUCOSE, CAPILLARY
Glucose-Capillary: 118 mg/dL — ABNORMAL HIGH (ref 70–99)
Glucose-Capillary: 119 mg/dL — ABNORMAL HIGH (ref 70–99)
Glucose-Capillary: 131 mg/dL — ABNORMAL HIGH (ref 70–99)
Glucose-Capillary: 158 mg/dL — ABNORMAL HIGH (ref 70–99)
Glucose-Capillary: 72 mg/dL (ref 70–99)
Glucose-Capillary: 97 mg/dL (ref 70–99)

## 2022-08-10 LAB — POCT I-STAT EG7
Acid-Base Excess: 2 mmol/L (ref 0.0–2.0)
Bicarbonate: 33.4 mmol/L — ABNORMAL HIGH (ref 20.0–28.0)
Calcium, Ion: 1.14 mmol/L — ABNORMAL LOW (ref 1.15–1.40)
HCT: 50 % (ref 39.0–52.0)
Hemoglobin: 17 g/dL (ref 13.0–17.0)
O2 Saturation: 80 %
Patient temperature: 37.3
Potassium: 5.2 mmol/L — ABNORMAL HIGH (ref 3.5–5.1)
Sodium: 138 mmol/L (ref 135–145)
TCO2: 36 mmol/L — ABNORMAL HIGH (ref 22–32)
pCO2, Ven: 85.5 mmHg (ref 44–60)
pH, Ven: 7.202 — ABNORMAL LOW (ref 7.25–7.43)
pO2, Ven: 57 mmHg — ABNORMAL HIGH (ref 32–45)

## 2022-08-10 LAB — POCT I-STAT 7, (LYTES, BLD GAS, ICA,H+H)
Acid-Base Excess: 2 mmol/L (ref 0.0–2.0)
Acid-Base Excess: 5 mmol/L — ABNORMAL HIGH (ref 0.0–2.0)
Bicarbonate: 33.6 mmol/L — ABNORMAL HIGH (ref 20.0–28.0)
Bicarbonate: 30.2 mmol/L — ABNORMAL HIGH (ref 20.0–28.0)
Calcium, Ion: 1.14 mmol/L — ABNORMAL LOW (ref 1.15–1.40)
Calcium, Ion: 1.09 mmol/L — ABNORMAL LOW (ref 1.15–1.40)
HCT: 50 % (ref 39.0–52.0)
HCT: 48 % (ref 39.0–52.0)
Hemoglobin: 17 g/dL (ref 13.0–17.0)
Hemoglobin: 16.3 g/dL (ref 13.0–17.0)
O2 Saturation: 82 %
O2 Saturation: 96 %
Patient temperature: 37.3
Patient temperature: 38.5
Potassium: 5.1 mmol/L (ref 3.5–5.1)
Potassium: 4.9 mmol/L (ref 3.5–5.1)
Sodium: 137 mmol/L (ref 135–145)
Sodium: 138 mmol/L (ref 135–145)
TCO2: 36 mmol/L — ABNORMAL HIGH (ref 22–32)
TCO2: 32 mmol/L (ref 22–32)
pCO2 arterial: 83.4 mmHg (ref 32–48)
pCO2 arterial: 48.7 mmHg — ABNORMAL HIGH (ref 32–48)
pH, Arterial: 7.215 — ABNORMAL LOW (ref 7.35–7.45)
pH, Arterial: 7.406 (ref 7.35–7.45)
pO2, Arterial: 59 mmHg — ABNORMAL LOW (ref 83–108)
pO2, Arterial: 86 mmHg (ref 83–108)

## 2022-08-10 LAB — TROPONIN I (HIGH SENSITIVITY)
Troponin I (High Sensitivity): 32 ng/L — ABNORMAL HIGH (ref <18)
Troponin I (High Sensitivity): 161 ng/L (ref <18)

## 2022-08-10 LAB — RESPIRATORY PANEL BY PCR

## 2022-08-10 LAB — URINALYSIS, ROUTINE W REFLEX MICROSCOPIC
Bilirubin Urine: NEGATIVE
Glucose, UA: NEGATIVE mg/dL
Ketones, ur: NEGATIVE mg/dL
Nitrite: NEGATIVE
Protein, ur: 100 mg/dL — AB
RBC / HPF: >50 RBC/hpf (ref 0–5)
Specific Gravity, Urine: 1.042 — ABNORMAL HIGH (ref 1.005–1.030)
pH: 5 (ref 5.0–8.0)

## 2022-08-10 LAB — I-STAT CHEM 8, ED
BUN: 28 mg/dL — ABNORMAL HIGH (ref 8–23)
Calcium, Ion: 1.09 mmol/L — ABNORMAL LOW (ref 1.15–1.40)
Chloride: 100 mmol/L (ref 98–111)
Creatinine, Ser: 1.9 mg/dL — ABNORMAL HIGH (ref 0.61–1.24)
Glucose, Bld: 153 mg/dL — ABNORMAL HIGH (ref 70–99)
HCT: 55 % — ABNORMAL HIGH (ref 39.0–52.0)
Hemoglobin: 18.7 g/dL — ABNORMAL HIGH (ref 13.0–17.0)
Potassium: 5.1 mmol/L (ref 3.5–5.1)
Sodium: 138 mmol/L (ref 135–145)
TCO2: 29 mmol/L (ref 22–32)

## 2022-08-10 LAB — ECHOCARDIOGRAM COMPLETE
AR max vel: 3.17 cm2
AV Area VTI: 2.64 cm2
AV Area mean vel: 3.05 cm2
AV Mean grad: 4 mmHg
AV Peak grad: 8.4 mmHg
Ao pk vel: 1.45 m/s
Area-P 1/2: 2.95 cm2
S' Lateral: 3.3 cm

## 2022-08-10 LAB — CBC
HCT: 49 % (ref 39.0–52.0)
HCT: 46.8 % (ref 39.0–52.0)
Hemoglobin: 16 g/dL (ref 13.0–17.0)
Hemoglobin: 16.3 g/dL (ref 13.0–17.0)
MCH: 30.7 pg (ref 26.0–34.0)
MCH: 30.5 pg (ref 26.0–34.0)
MCHC: 32.7 g/dL (ref 30.0–36.0)
MCHC: 34.8 g/dL (ref 30.0–36.0)
MCV: 93.9 fL (ref 80.0–100.0)
MCV: 87.5 fL (ref 80.0–100.0)
Platelets: 178 10*3/uL (ref 150–400)
Platelets: 127 10*3/uL — ABNORMAL LOW (ref 150–400)
RBC: 5.22 MIL/uL (ref 4.22–5.81)
RBC: 5.35 MIL/uL (ref 4.22–5.81)
RDW: 14.4 % (ref 11.5–15.5)
RDW: 14 % (ref 11.5–15.5)
WBC: 18 10*3/uL — ABNORMAL HIGH (ref 4.0–10.5)
WBC: 14.2 10*3/uL — ABNORMAL HIGH (ref 4.0–10.5)
nRBC: 0.5 % — ABNORMAL HIGH (ref 0.0–0.2)
nRBC: 0.6 % — ABNORMAL HIGH (ref 0.0–0.2)

## 2022-08-10 LAB — TYPE AND SCREEN
ABO/RH(D): AB POS
Antibody Screen: NEGATIVE

## 2022-08-10 LAB — I-STAT ARTERIAL BLOOD GAS, ED
Acid-base deficit: 3 mmol/L — ABNORMAL HIGH (ref 0.0–2.0)
Bicarbonate: 31.3 mmol/L — ABNORMAL HIGH (ref 20.0–28.0)
Calcium, Ion: 1.15 mmol/L (ref 1.15–1.40)
HCT: 48 % (ref 39.0–52.0)
Hemoglobin: 16.3 g/dL (ref 13.0–17.0)
O2 Saturation: 79 %
Patient temperature: 98.6
Potassium: 5.2 mmol/L — ABNORMAL HIGH (ref 3.5–5.1)
Sodium: 137 mmol/L (ref 135–145)
TCO2: 34 mmol/L — ABNORMAL HIGH (ref 22–32)
pCO2 arterial: 105.1 mmHg (ref 32–48)
pH, Arterial: 7.081 — CL (ref 7.35–7.45)
pO2, Arterial: 62 mmHg — ABNORMAL LOW (ref 83–108)

## 2022-08-10 LAB — TRIGLYCERIDES: Triglycerides: 106 mg/dL (ref <150)

## 2022-08-10 LAB — PHOSPHORUS: Phosphorus: 6.6 mg/dL — ABNORMAL HIGH (ref 2.5–4.6)

## 2022-08-10 LAB — RAPID URINE DRUG SCREEN, HOSP PERFORMED
Amphetamines: NOT DETECTED
Barbiturates: NOT DETECTED
Benzodiazepines: POSITIVE — AB
Cocaine: NOT DETECTED
Opiates: NOT DETECTED
Tetrahydrocannabinol: NOT DETECTED

## 2022-08-10 LAB — STREP PNEUMONIAE URINARY ANTIGEN: Strep Pneumo Urinary Antigen: NEGATIVE

## 2022-08-10 LAB — BASIC METABOLIC PANEL
Anion gap: 13 (ref 5–15)
BUN: 25 mg/dL — ABNORMAL HIGH (ref 8–23)
CO2: 30 mmol/L (ref 22–32)
Calcium: 7.8 mg/dL — ABNORMAL LOW (ref 8.9–10.3)
Chloride: 95 mmol/L — ABNORMAL LOW (ref 98–111)
Creatinine, Ser: 2.19 mg/dL — ABNORMAL HIGH (ref 0.61–1.24)
GFR, Estimated: 30 mL/min — ABNORMAL LOW (ref >60)
Glucose, Bld: 122 mg/dL — ABNORMAL HIGH (ref 70–99)
Potassium: 4.6 mmol/L (ref 3.5–5.1)
Sodium: 138 mmol/L (ref 135–145)

## 2022-08-10 LAB — LACTIC ACID, PLASMA
Lactic Acid, Venous: 5.5 mmol/L (ref 0.5–1.9)
Lactic Acid, Venous: 7.7 mmol/L (ref 0.5–1.9)
Lactic Acid, Venous: 2.2 mmol/L (ref 0.5–1.9)

## 2022-08-10 LAB — PROTIME-INR
INR: 1.4 — ABNORMAL HIGH (ref 0.8–1.2)
INR: 1.6 — ABNORMAL HIGH (ref 0.8–1.2)
Prothrombin Time: 16.6 seconds — ABNORMAL HIGH (ref 11.4–15.2)
Prothrombin Time: 19 seconds — ABNORMAL HIGH (ref 11.4–15.2)

## 2022-08-10 LAB — APTT
aPTT: 35 seconds (ref 24–36)
aPTT: 52 seconds — ABNORMAL HIGH (ref 24–36)

## 2022-08-10 LAB — PROCALCITONIN: Procalcitonin: <0.1 ng/mL

## 2022-08-10 LAB — D-DIMER, QUANTITATIVE: D-Dimer, Quant: >20 ug/mL-FEU — ABNORMAL HIGH (ref 0.00–0.50)

## 2022-08-10 LAB — HEPARIN LEVEL (UNFRACTIONATED): Heparin Unfractionated: 0.31 IU/mL (ref 0.30–0.70)

## 2022-08-10 LAB — SARS CORONAVIRUS 2 BY RT PCR: SARS Coronavirus 2 by RT PCR: NEGATIVE

## 2022-08-10 LAB — MAGNESIUM: Magnesium: 2.1 mg/dL (ref 1.7–2.4)

## 2022-08-10 LAB — MRSA NEXT GEN BY PCR, NASAL: MRSA by PCR Next Gen: NOT DETECTED

## 2022-08-10 MED ORDER — SODIUM BICARBONATE 8.4 % IV SOLN
INTRAVENOUS | Status: AC
  Filled 2022-08-10: qty 50

## 2022-08-10 MED ORDER — FENTANYL BOLUS VIA INFUSION: 25.0000 ug | INTRAVENOUS | Status: DC | PRN

## 2022-08-10 MED ORDER — NOREPINEPHRINE 4 MG/250ML-% IV SOLN
INTRAVENOUS | Status: AC
  Filled 2022-08-10: qty 250

## 2022-08-10 MED ORDER — EPINEPHRINE 1 MG/10ML IJ SOSY
PREFILLED_SYRINGE | INTRAMUSCULAR | Status: AC | PRN
  Administered 2022-08-10 (×3): 1 mg via INTRAVENOUS

## 2022-08-10 MED ORDER — FENTANYL 2500MCG IN NS 250ML (10MCG/ML) PREMIX INFUSION
25.0000 ug/h | INTRAVENOUS | Status: DC
  Administered 2022-08-10: 25 ug/h via INTRAVENOUS
  Filled 2022-08-10: qty 250

## 2022-08-10 MED ORDER — SODIUM CHLORIDE 0.9 % IV SOLN: INTRAVENOUS | Status: DC

## 2022-08-10 MED ORDER — ALBUTEROL SULFATE (2.5 MG/3ML) 0.083% IN NEBU: 2.5000 mg | INHALATION_SOLUTION | RESPIRATORY_TRACT | Status: DC | PRN

## 2022-08-10 MED ORDER — SODIUM CHLORIDE 0.9 % IV SOLN
INTRAVENOUS | Status: AC | PRN
  Administered 2022-08-10: 1000 mL via INTRAVENOUS

## 2022-08-10 MED ORDER — ORAL CARE MOUTH RINSE: 15.0000 mL | OROMUCOSAL | Status: DC | PRN

## 2022-08-10 MED ORDER — FENTANYL BOLUS VIA INFUSION
50.0000 ug | INTRAVENOUS | Status: DC | PRN
  Administered 2022-08-10: 50 ug via INTRAVENOUS
  Administered 2022-08-12 – 2022-08-15 (×3): 100 ug via INTRAVENOUS
  Administered 2022-08-15: 50 ug via INTRAVENOUS
  Administered 2022-08-15: 100 ug via INTRAVENOUS
  Administered 2022-08-15: 50 ug via INTRAVENOUS
  Administered 2022-08-15 – 2022-08-16 (×5): 100 ug via INTRAVENOUS
  Administered 2022-08-16: 50 ug via INTRAVENOUS
  Administered 2022-08-16 – 2022-08-17 (×10): 100 ug via INTRAVENOUS
  Administered 2022-08-19: 50 ug via INTRAVENOUS

## 2022-08-10 MED ORDER — LACTATED RINGERS IV BOLUS
1000.0000 mL | Freq: Once | INTRAVENOUS | Status: AC
  Administered 2022-08-10: 1000 mL via INTRAVENOUS

## 2022-08-10 MED ORDER — SODIUM CHLORIDE 0.9 % IV SOLN
3.0000 g | Freq: Four times a day (QID) | INTRAVENOUS | Status: DC
  Filled 2022-08-10: qty 8

## 2022-08-10 MED ORDER — AMIODARONE HCL 150 MG/3ML IV SOLN
INTRAVENOUS | Status: AC | PRN
  Administered 2022-08-10: 150 mg via INTRAVENOUS

## 2022-08-10 MED ORDER — MIDAZOLAM BOLUS VIA INFUSION
2.0000 mg | INTRAVENOUS | Status: DC | PRN
  Administered 2022-08-10 – 2022-08-12 (×2): 2 mg via INTRAVENOUS

## 2022-08-10 MED ORDER — HEPARIN (PORCINE) 25000 UT/250ML-% IV SOLN
1200.0000 [IU]/h | INTRAVENOUS | Status: DC
  Administered 2022-08-10: 1250 [IU]/h via INTRAVENOUS
  Administered 2022-08-11: 1200 [IU]/h via INTRAVENOUS
  Filled 2022-08-10 (×3): qty 250

## 2022-08-10 MED ORDER — FENTANYL CITRATE PF 50 MCG/ML IJ SOSY: 25.0000 ug | PREFILLED_SYRINGE | Freq: Once | INTRAMUSCULAR | Status: DC

## 2022-08-10 MED ORDER — NOREPINEPHRINE 4 MG/250ML-% IV SOLN
0.0000 ug/min | INTRAVENOUS | Status: DC
  Administered 2022-08-10: 40 ug/min via INTRAVENOUS
  Filled 2022-08-10: qty 500

## 2022-08-10 MED ORDER — FENTANYL CITRATE (PF) 2500 MCG/50ML IJ SOLN
0.0000 ug/h | Status: DC
  Administered 2022-08-10 – 2022-08-11 (×2): 200 ug/h via INTRAVENOUS
  Administered 2022-08-13: 50 ug/h via INTRAVENOUS
  Administered 2022-08-15: 100 ug/h via INTRAVENOUS
  Administered 2022-08-16: 150 ug/h via INTRAVENOUS
  Administered 2022-08-17 – 2022-08-18 (×2): 175 ug/h via INTRAVENOUS
  Filled 2022-08-10 (×12): qty 100

## 2022-08-10 MED ORDER — PERFLUTREN LIPID MICROSPHERE
1.0000 mL | INTRAVENOUS | Status: AC | PRN
  Administered 2022-08-10: 8 mL via INTRAVENOUS

## 2022-08-10 MED ORDER — SODIUM CHLORIDE 0.9 % IV SOLN
2.0000 g | INTRAVENOUS | Status: AC
  Administered 2022-08-10 – 2022-08-15 (×6): 2 g via INTRAVENOUS
  Filled 2022-08-10 (×6): qty 20

## 2022-08-10 MED ORDER — EPINEPHRINE HCL 5 MG/250ML IV SOLN IN NS
0.5000 ug/min | INTRAVENOUS | Status: DC
  Administered 2022-08-10: 5 ug/min via INTRAVENOUS
  Administered 2022-08-10: 20 ug/min via INTRAVENOUS
  Administered 2022-08-10: 10 ug/min via INTRAVENOUS
  Administered 2022-08-11: 14 ug/min via INTRAVENOUS
  Administered 2022-08-11: 3 ug/min via INTRAVENOUS
  Filled 2022-08-10 (×5): qty 250

## 2022-08-10 MED ORDER — PANTOPRAZOLE SODIUM 40 MG IV SOLR
40.0000 mg | Freq: Every day | INTRAVENOUS | Status: DC
  Administered 2022-08-10 – 2022-08-19 (×10): 40 mg via INTRAVENOUS
  Filled 2022-08-10 (×10): qty 10

## 2022-08-10 MED ORDER — EPINEPHRINE 1 MG/10ML IJ SOSY
PREFILLED_SYRINGE | INTRAMUSCULAR | Status: AC | PRN
  Administered 2022-08-10 (×5): 1 mg via INTRAVENOUS

## 2022-08-10 MED ORDER — NOREPINEPHRINE 16 MG/250ML-% IV SOLN
0.0000 ug/min | INTRAVENOUS | Status: DC
  Administered 2022-08-10: 28 ug/min via INTRAVENOUS
  Administered 2022-08-10: 38 ug/min via INTRAVENOUS
  Administered 2022-08-11: 35 ug/min via INTRAVENOUS
  Administered 2022-08-11: 38 ug/min via INTRAVENOUS
  Administered 2022-08-11: 40 ug/min via INTRAVENOUS
  Administered 2022-08-12: 22 ug/min via INTRAVENOUS
  Administered 2022-08-12: 35 ug/min via INTRAVENOUS
  Filled 2022-08-10 (×9): qty 250

## 2022-08-10 MED ORDER — IOHEXOL 350 MG/ML SOLN
60.0000 mL | Freq: Once | INTRAVENOUS | Status: AC | PRN
  Administered 2022-08-10: 60 mL via INTRAVENOUS

## 2022-08-10 MED ORDER — IPRATROPIUM-ALBUTEROL 0.5-2.5 (3) MG/3ML IN SOLN
3.0000 mL | Freq: Four times a day (QID) | RESPIRATORY_TRACT | Status: DC
  Administered 2022-08-10 – 2022-08-12 (×8): 3 mL via RESPIRATORY_TRACT
  Filled 2022-08-10 (×10): qty 3

## 2022-08-10 MED ORDER — SODIUM BICARBONATE 8.4 % IV SOLN
50.0000 meq | Freq: Once | INTRAVENOUS | Status: AC
  Administered 2022-08-10: 50 meq via INTRAVENOUS

## 2022-08-10 MED ORDER — ORAL CARE MOUTH RINSE
15.0000 mL | OROMUCOSAL | Status: DC
  Administered 2022-08-10 – 2022-08-20 (×120): 15 mL via OROMUCOSAL

## 2022-08-10 MED ORDER — DOCUSATE SODIUM 50 MG/5ML PO LIQD: 100.0000 mg | Freq: Two times a day (BID) | ORAL | Status: DC | PRN

## 2022-08-10 MED ORDER — SODIUM CHLORIDE 0.9% FLUSH
10.0000 mL | Freq: Two times a day (BID) | INTRAVENOUS | Status: DC
  Administered 2022-08-10 – 2022-08-12 (×5): 10 mL
  Administered 2022-08-13: 20 mL
  Administered 2022-08-13 – 2022-08-14 (×2): 10 mL
  Administered 2022-08-14 – 2022-08-15 (×2): 20 mL
  Administered 2022-08-15 – 2022-08-19 (×8): 10 mL
  Administered 2022-08-20: 20 mL

## 2022-08-10 MED ORDER — POLYETHYLENE GLYCOL 3350 17 G PO PACK: 17.0000 g | PACK | Freq: Every day | ORAL | Status: DC | PRN

## 2022-08-10 MED ORDER — MIDAZOLAM HCL 2 MG/2ML IJ SOLN
1.0000 mg | INTRAMUSCULAR | Status: DC | PRN
  Administered 2022-08-10: 2 mg via INTRAVENOUS
  Filled 2022-08-10: qty 2

## 2022-08-10 MED ORDER — SODIUM CHLORIDE 0.9% FLUSH: 10.0000 mL | INTRAVENOUS | Status: DC | PRN

## 2022-08-10 MED ORDER — TENECTEPLASE 50 MG IV KIT
50.0000 mg | PACK | INTRAVENOUS | Status: AC
  Administered 2022-08-10: 50 mg via INTRAVENOUS
  Filled 2022-08-10 (×2): qty 10

## 2022-08-10 MED ORDER — MIDAZOLAM-SODIUM CHLORIDE 100-0.9 MG/100ML-% IV SOLN
0.5000 mg/h | INTRAVENOUS | Status: DC
  Administered 2022-08-10: 0.5 mg/h via INTRAVENOUS
  Administered 2022-08-11 – 2022-08-13 (×2): 2 mg/h via INTRAVENOUS
  Filled 2022-08-10 (×4): qty 100

## 2022-08-10 MED ORDER — HEPARIN SODIUM (PORCINE) 5000 UNIT/ML IJ SOLN: 5000.0000 [IU] | Freq: Three times a day (TID) | INTRAMUSCULAR | Status: DC

## 2022-08-10 MED ORDER — DOCUSATE SODIUM 50 MG/5ML PO LIQD
100.0000 mg | Freq: Two times a day (BID) | ORAL | Status: DC
  Administered 2022-08-10 – 2022-08-16 (×12): 100 mg
  Filled 2022-08-10 (×12): qty 10

## 2022-08-10 MED ORDER — CHLORHEXIDINE GLUCONATE CLOTH 2 % EX PADS
6.0000 | MEDICATED_PAD | Freq: Every day | CUTANEOUS | Status: DC
  Administered 2022-08-11 – 2022-08-19 (×12): 6 via TOPICAL

## 2022-08-10 MED ORDER — PHENYLEPHRINE HCL (PRESSORS) 10 MG/ML IV SOLN
INTRAVENOUS | Status: AC | PRN
  Administered 2022-08-10: 160 ug via INTRAVENOUS

## 2022-08-10 MED ORDER — ACETAMINOPHEN 325 MG PO TABS
650.0000 mg | ORAL_TABLET | Freq: Three times a day (TID) | ORAL | Status: DC | PRN
  Administered 2022-08-10 – 2022-08-11 (×2): 650 mg
  Filled 2022-08-10: qty 2

## 2022-08-10 MED ORDER — POLYETHYLENE GLYCOL 3350 17 G PO PACK
17.0000 g | PACK | Freq: Every day | ORAL | Status: DC
  Administered 2022-08-10: 17 g
  Filled 2022-08-10: qty 1

## 2022-08-10 MED ORDER — NOREPINEPHRINE 4 MG/250ML-% IV SOLN
INTRAVENOUS | Status: AC | PRN
  Administered 2022-08-10: 10 ug/min via INTRAVENOUS

## 2022-08-10 MED ORDER — SODIUM ZIRCONIUM CYCLOSILICATE 10 G PO PACK
10.0000 g | PACK | Freq: Once | ORAL | Status: AC
  Administered 2022-08-10: 10 g
  Filled 2022-08-10: qty 1

## 2022-08-10 MED ORDER — DEXMEDETOMIDINE HCL IN NACL 400 MCG/100ML IV SOLN
0.0000 ug/kg/h | INTRAVENOUS | Status: DC
  Administered 2022-08-10: 0.4 ug/kg/h via INTRAVENOUS
  Filled 2022-08-10: qty 100

## 2022-08-10 MED ORDER — SODIUM BICARBONATE 8.4 % IV SOLN
INTRAVENOUS | Status: AC | PRN
  Administered 2022-08-10 (×3): 50 meq via INTRAVENOUS

## 2022-08-10 MED ORDER — ONDANSETRON HCL 4 MG/2ML IJ SOLN
4.0000 mg | Freq: Four times a day (QID) | INTRAMUSCULAR | Status: DC | PRN
  Administered 2022-08-18 (×2): 4 mg via INTRAVENOUS
  Filled 2022-08-10 (×2): qty 2

## 2022-08-10 MED ORDER — INSULIN ASPART 100 UNIT/ML IJ SOLN
0.0000 [IU] | INTRAMUSCULAR | Status: DC
  Administered 2022-08-10: 2 [IU] via SUBCUTANEOUS
  Administered 2022-08-10 – 2022-08-11 (×2): 3 [IU] via SUBCUTANEOUS
  Administered 2022-08-11 – 2022-08-12 (×3): 2 [IU] via SUBCUTANEOUS
  Administered 2022-08-12 (×3): 3 [IU] via SUBCUTANEOUS
  Administered 2022-08-13: 2 [IU] via SUBCUTANEOUS
  Administered 2022-08-13: 4 [IU] via SUBCUTANEOUS
  Administered 2022-08-13 – 2022-08-16 (×3): 2 [IU] via SUBCUTANEOUS
  Administered 2022-08-16: 3 [IU] via SUBCUTANEOUS
  Administered 2022-08-17: 2 [IU] via SUBCUTANEOUS

## 2022-08-10 NOTE — Progress Notes (Signed)
ANTICOAGULATION CONSULT NOTE - Initial Consult  Pharmacy Consult for heparin infusion Indication: pulmonary embolus  Allergies  Allergen Reactions   Latex Hives and Rash    Patient Measurements:  Total body weight: 121.6 kg Heparin Dosing Weight: 100.3 kg  Vital Signs: Temp: 100.4 F (38 C) (04/01 1115) BP: 127/77 (04/01 1230) Pulse Rate: 73 (04/01 1230)  Labs: Recent Labs    08/10/22 0526 08/10/22 0615 08/10/22 0622 08/10/22 0817 08/10/22 0826  HGB 18.7* 16.0 16.3 17.0 17.0  HCT 55.0* 49.0 48.0 50.0 50.0  PLT  --  178  --   --   --   APTT  --  35  --   --   --   LABPROT  --  16.6*  --   --   --   INR  --  1.4*  --   --   --   CREATININE 1.90* 1.89*  --   --   --   TROPONINIHS  --  32*  --   --   --     Estimated Creatinine Clearance: 43.5 mL/min (A) (by C-G formula based on SCr of 1.89 mg/dL (H)).   Medical History: Past Medical History:  Diagnosis Date   ANXIETY 02/16/2007   DEPRESSION 02/16/2007   Diverticulosis    ERECTILE DYSFUNCTION 02/16/2007   FATTY LIVER DISEASE 06/07/2008   HYPERGLYCEMIA 02/16/2007   Hyperlipidemia    Internal hemorrhoid    NASH (nonalcoholic steatohepatitis)    PULMONARY EMBOLISM, HX OF 02/16/2007   SUBACROMIAL BURSITIS, RIGHT 06/07/2008   Tubular adenoma of colon 07/2001   VENOUS INSUFFICIENCY 02/16/2007    Assessment: 77 yo M admitted s/p cardiac arrest on high dose vasopressors. Pt was found to have pulmonary embolus with R heart strain on CT scan on 08/10/22. Given hemodynamic instability and clinical picture, tenecteplase 50mg  IV x1 was given 08/10/22 at 1313.   Pharmacy consulted to dose heparin drip after TNK. To begin once aPTT <80s.   aPTT drawn 30 mins after TNK = 52 (subtherapeutic)  Goal of Therapy:  No bolus s/p TNK (4/1) HL goal 0.3-0.5 x24 hours (until 4/2 @ 1315) then Heparin level 0.3-0.7 units/ml Monitor platelets by anticoagulation protocol: Yes   Plan:  No bolus Start heparin infusion at 1250 units/hr 8hr HL  at 2300  Daily HL, CBC  F/u s/sx bleeding and long term anticoag plans   Wilson Singer, PharmD Clinical Pharmacist 08/10/2022 1:18 PM

## 2022-08-10 NOTE — Progress Notes (Signed)
ABG obtained but it was believed to be mixed venous after being ran on Istat, therefore another sample was obtained and results were the same. CCM MD and NP at bedside were informed. Peep increased to 12.

## 2022-08-10 NOTE — ED Triage Notes (Signed)
Pt's family reports witnessed arrest by family. Pt was walking to bathroom when family witnessed pt collapse.

## 2022-08-10 NOTE — TOC Progression Note (Signed)
Transition of Care [facility]) - Progression Note    Patient Details  Name: Kurt Blair MRN: [id number] Date of Birth: Sep 21, 1945  Transition of Care [facility]) CM/SW [place], RN Phone Number: 08/10/2022, 10:14 AM  Clinical Narrative:     Patient with witnessed arrest at home while walking to bathroom. With EMS -- CPR [phone], [phone]. It sounds like pulses were lost a third time just prior to arrival and pt required several rounds of ACLS, though duration is not clear.  Patient currently intubated.  TOC following.        Expected Discharge Plan and Services                                               Social Determinants of Health (SDOH) Interventions SDOH Screenings   Food Insecurity: No Food Insecurity (10/15/2021)  Housing: Low Risk  (10/15/2021)  Transportation Needs: No Transportation Needs (10/15/2021)  Alcohol Screen: Low Risk  (10/15/2021)  Depression (PHQ2-9): Low Risk  (12/31/2021)  Financial Resource Strain: Low Risk  (10/15/2021)  Physical Activity: Sufficiently Active (10/15/2021)  Social Connections: Socially Integrated (10/15/2021)  Stress: No Stress Concern Present (10/15/2021)  Tobacco Use: Medium Risk (08/03/2022)    Readmission Risk Interventions     No data to display

## 2022-08-10 NOTE — Progress Notes (Signed)
Pt transported on vent from trauma B to [id number] without complications.

## 2022-08-10 NOTE — Progress Notes (Signed)
Pharmacy Antibiotic Note  ARRIS RAUTH is a 77 y.o. male admitted on 08/10/2022 s/p cardiac arrest w/ concern for aspiration pneumonia.  Pharmacy has been consulted for Unasyn dosing.  Plan: Unasyn 3g IV Q6H.   Temp (24hrs), Avg:92.3 F (33.5 C), Min:91 F (32.8 C), Max:94.1 F (34.5 C)  Recent Labs  Lab 08/03/22 1213 08/10/22 0526 08/10/22 0615  WBC 6.4  --  18.0*  CREATININE 1.01 1.90*  --     Estimated Creatinine Clearance: 43.2 mL/min (A) (by C-G formula based on SCr of 1.9 mg/dL (H)).    Allergies  Allergen Reactions   Latex Hives and Rash    Thank you for allowing pharmacy to be a part of this patient's care.  Wynona Neat, PharmD, BCPS  08/10/2022 6:50 AM

## 2022-08-10 NOTE — Progress Notes (Addendum)
Chaplain followed up on referral from Artist Pais to offer support for pt's wife.  Chaplain found wife and other family members in the family waiting area.  Chaplain offered ministry of presence while listening to wife's story of what happened to her husband.  Wife asked for prayers requesting Jesus to hold her husband in his arms and if it is hie time to die, to give her strength to endure.  Chaplain offered that request in prayer.  [place]

## 2022-08-10 NOTE — ED Provider Notes (Signed)
[facility] Provider Note  CSN: [id number] Arrival date & time: 08/10/22 0445  Chief Complaint(s) Cardiac Arrest  HPI Kurt Blair is a 77 y.o. male who presents to the emergency department in cardiac arrest.  I was witnessed cardiac arrest by family while patient was trying to walk into the bathroom.  His wife called EMS when he would not respond.  Initial rhythm was PEA.  ACLS was initiated and ROSC obtained and route.  Pulses lost and ACLS restarted.  ROSC obtained again patient started on epi drip.  Pulses lost just prior to arrival.  ACLS continued  The history is provided by the EMS personnel.    Past Medical History Past Medical History:  Diagnosis Date   ANXIETY 02/16/2007   DEPRESSION 02/16/2007   Diverticulosis    ERECTILE DYSFUNCTION 02/16/2007   FATTY LIVER DISEASE 06/07/2008   HYPERGLYCEMIA 02/16/2007   Hyperlipidemia    Internal hemorrhoid    NASH (nonalcoholic steatohepatitis)    PULMONARY EMBOLISM, HX OF 02/16/2007   SUBACROMIAL BURSITIS, RIGHT 06/07/2008   Tubular adenoma of colon 07/2001   VENOUS INSUFFICIENCY 02/16/2007   Patient Active Problem List   Diagnosis Date Noted   Erythrocytosis 08/04/2022   DOE (dyspnea on exertion) 08/03/2022   Chronic cough 08/03/2022   Leg edema, right 02/12/2022   Encounter for general adult medical examination with abnormal findings 01/25/2021   Prediabetes 01/21/2021   B12 deficiency 01/21/2021   Abnormal electrocardiogram (ECG) (EKG) 01/21/2021   Dietary folate deficiency anemia 11/01/2019   Dyslipidemia, goal LDL below 100 10/17/2019   Benign prostatic hyperplasia without lower urinary tract symptoms 10/17/2019   Vitamin D deficiency disease 06/29/2018   Morbid obesity due to excess calories complicated by hbp/ hyperlipidemia  02/05/2018   COPD GOLD 0  12/23/2017   Exercise hypoxemia 11/20/2017   CHF with left ventricular diastolic dysfunction, NYHA class 1 11/09/2017    Arthritis of shoulder region, degenerative 06/04/2016   Osteoarthritis of right knee 06/04/2016   Essential hypertension 04/14/2015   Hypothyroidism 11/13/2014   ALCOHOL USE 06/07/2008   NASH (nonalcoholic steatohepatitis) 06/07/2008   SUBACROMIAL BURSITIS, RIGHT 06/07/2008   ERECTILE DYSFUNCTION 02/16/2007   SMOKER 02/16/2007   VENOUS INSUFFICIENCY 02/16/2007   Home Medication(s) Prior to Admission medications   Medication Sig Start Date End Date Taking? Authorizing Provider  acetaminophen (TYLENOL) 650 MG CR tablet Take 650 mg by mouth every 8 (eight) hours as needed.    [provider]  albuterol (VENTOLIN HFA) 108 (90 Base) MCG/ACT inhaler TAKE 2 PUFFS BY MOUTH EVERY 6 HOURS AS NEEDED FOR WHEEZE OR SHORTNESS OF BREATH 01/21/22   Janith Lima, MD  atorvastatin (LIPITOR) 80 MG tablet TAKE 1 TABLET BY MOUTH EVERY DAY 04/25/22   Janith Lima, MD  Budeson-Glycopyrrol-Formoterol 160-9-4.8 MCG/ACT AERO Inhale 2 puffs into the lungs in the morning and at bedtime. 01/28/22   Janith Lima, MD  carvedilol (COREG) 12.5 MG tablet TAKE 1 TABLET (12.5MG  TOTAL) BY MOUTH TWICE A DAY WITH MEALS 08/03/22   Janith Lima, MD  carvedilol (COREG) 12.5 MG tablet Take 1 tablet (12.5 mg total) by mouth 2 (two) times daily with a meal. 08/03/22   Janith Lima, MD  Cholecalciferol 50 MCG (2000 UT) TABS Take 1 tablet (2,000 Units total) by mouth daily. 10/17/19   Janith Lima, MD  folic acid (FOLVITE) 1 MG tablet TAKE 1 TABLET BY MOUTH EVERY DAY 07/19/22   Janith Lima,  MD  KLOR-CON M20 20 MEQ tablet TAKE 1 TABLET BY MOUTH TWICE A DAY 02/15/22   Janith Lima, MD  levalbuterol [facility] HFA) 45 MCG/ACT inhaler Inhale 1 puff into the lungs every 6 (six) hours as needed for wheezing. 02/15/21   Janith Lima, MD  levocetirizine (XYZAL) 5 MG tablet TAKE 1 TABLET BY MOUTH EVERY DAY IN THE EVENING 02/15/22   Janith Lima, MD  Menthol, Topical Analgesic, (ICY HOT PAIN RELIEVING EX) Apply  topically daily as needed.    [provider]  Multiple Vitamin (MULTIVITAMIN) tablet Take 1 tablet by mouth daily. Centrum silver - mens 50+    [provider]  olmesartan (BENICAR) 40 MG tablet TAKE 1 TABLET BY MOUTH EVERY DAY 04/25/22   Horald Pollen, MD  torsemide (DEMADEX) 20 MG tablet TAKE 1 TABLET BY MOUTH TWICE A DAY 04/25/22   Janith Lima, MD                                                                                                                                    Allergies Latex  Review of Systems Review of Systems As noted in HPI  Physical Exam Vital Signs  I have reviewed the triage vital signs BP (!) 93/53   Pulse 64   Resp 16   SpO2 (!) 87%   Physical Exam Vitals reviewed.  Constitutional:      General: He is in acute distress.     Appearance: He is well-developed. He is toxic-appearing. He is not diaphoretic.  HENT:     Head: Normocephalic and atraumatic.     Nose: Nose normal.  Eyes:     General: No scleral icterus.       Right eye: No discharge.        Left eye: No discharge.     Conjunctiva/sclera: Conjunctivae normal.  Cardiovascular:     Heart sounds: No murmur heard.    No friction rub. No gallop.     Comments: pulseless Pulmonary:     Effort: Pulmonary effort is normal. No respiratory distress.     Breath sounds: Normal breath sounds. No stridor. No rales.  Abdominal:     General: There is no distension.     Palpations: Abdomen is soft.     Tenderness: There is no abdominal tenderness.  Musculoskeletal:        General: No tenderness.     Cervical back: Normal range of motion and neck supple.     Right lower leg: 2+ Pitting Edema present.     Left lower leg: 2+ Pitting Edema present.       Legs:  Skin:    General: Skin is warm and dry.     Findings: No erythema or rash.  Neurological:     Mental Status: He is unresponsive.     GCS: GCS eye subscore is 1. GCS verbal subscore is 1. GCS  motor subscore is 1.      Comments: No corneal reflex     ED Results and Treatments Labs (all labs ordered are listed, but only abnormal results are displayed) Labs Reviewed  I-STAT CHEM 8, ED - Abnormal; Notable for the following components:      Result Value   BUN 28 (*)    Creatinine, Ser 1.90 (*)    Glucose, Bld 153 (*)    Calcium, Ion 1.09 (*)    Hemoglobin 18.7 (*)    HCT 55.0 (*)    All other components within normal limits  CULTURE, BLOOD (ROUTINE X 2)  CULTURE, BLOOD (ROUTINE X 2)  CULTURE, RESPIRATORY W GRAM STAIN  BLOOD GAS, ARTERIAL  COMPREHENSIVE METABOLIC PANEL  LACTIC ACID, PLASMA  LACTIC ACID, PLASMA  LACTIC ACID, PLASMA  LACTIC ACID, PLASMA  CBC  APTT  PROTIME-INR  MAGNESIUM  PHOSPHORUS  RAPID URINE DRUG SCREEN, HOSP PERFORMED  PROCALCITONIN  TRIGLYCERIDES  CBG MONITORING, ED  TYPE AND SCREEN  ABO/RH  TROPONIN I (HIGH SENSITIVITY)                                                                                                                         EKG  EKG Interpretation  Date/Time:  Monday August 10 2022 05:00:50 EDT Ventricular Rate:  99 PR Interval:  247 QRS Duration: 103 QT Interval:  339 QTC Calculation: 435 R Axis:   59 Text Interpretation: Sinus tachycardia Ventricular premature complex Prolonged PR interval Biatrial enlargement Probable anterior infarct, age indeterminate Baseline wander in lead(s) V5 Confirmed by Addison Lank [id number]) on 08/10/2022 6:04:09 AM       Radiology DG Chest Portable 1 View  Result Date: 08/10/2022 CLINICAL DATA:  Intubation EXAM: PORTABLE CHEST 1 VIEW COMPARISON:  08/03/2022 FINDINGS: Endotracheal tube with tip at the clavicular heads. An enteric tube at least reaches the stomach. Bilateral airspace disease greater on the right. There could be small volume pleural fluid on the left. No pneumothorax. Generous heart size accentuated by technique. IMPRESSION: 1. Located endotracheal and enteric tubes. 2. Low volume chest with  perihilar airspace disease suggesting pneumonia or aspiration. Electronically Signed   By: Jorje Guild M.D.   On: 08/10/2022 05:47   DG Abd Portable 1 View  Result Date: 08/10/2022 CLINICAL DATA:  Verify orogastric tube EXAM: PORTABLE ABDOMEN - 1 VIEW COMPARISON:  None Available. FINDINGS: There is an enteric tube with tip and side port at the stomach. Extensive artifact from support hardware. Lower chest as described on dedicated radiograph. No gas dilated bowel. IMPRESSION: Located enteric tube with tip and side port at the stomach. Electronically Signed   By: Jorje Guild M.D.   On: 08/10/2022 05:45    Medications Ordered in ED Medications  sodium bicarbonate injection (50 mEq Intravenous Given 08/10/22 0518)  EPINEPHrine (ADRENALIN) 1 MG/10ML injection (1 mg Intravenous Given 08/10/22 0520)  EPINEPHrine (ADRENALIN) 1 MG/10ML injection (1 mg Intravenous Given 08/10/22 0533)  amiodarone (CORDARONE) injection (150  mg Intravenous Given 08/10/22 0502)  0.9 %  sodium chloride infusion (1,000 mLs Intravenous New Bag/Given 08/10/22 0444)  norepinephrine (LEVOPHED) 4mg  in 255mL (0.016 mg/mL) premix infusion (30 mcg/min Intravenous Rate/Dose Change 08/10/22 0554)  EPINEPHrine (ADRENALIN) 5 mg in NS 250 mL (0.02 mg/mL) premix infusion (30 mcg/min Intravenous Rate/Dose Change 08/10/22 0551)  phenylephrine (NEO-SYNEPHRINE) injection (160 mcg Intravenous Given 08/10/22 0535)  lactated ringers bolus 1,000 mL (1,000 mLs Intravenous New Bag/Given 08/10/22 0603)                                                                                                                                     Procedures Procedure Name: Intubation Date/Time: 08/10/2022 6:13 AM  Performed by: Fatima Blank, MDPre-anesthesia Checklist: Patient identified, Patient being monitored, Emergency Drugs available, Timeout performed and Suction available Oxygen Delivery Method: Ambu bag Ventilation: Mask ventilation without  difficulty Laryngoscope Size: Glidescope Grade View: Grade I Tube size: 7.5 mm Number of attempts: 1 Airway Equipment and Method: Rigid stylet Placement Confirmation: ETT inserted through vocal cords under direct vision, CO2 detector and Breath sounds checked- equal and bilateral Secured at: 21 cm Tube secured with: ETT holder    .Critical Care  Performed by: Fatima Blank, MD Authorized by: Fatima Blank, MD   Critical care provider statement:    Critical care time (minutes):  45   Critical care time was exclusive of:  Separately billable procedures and treating other patients   Critical care was necessary to treat or prevent imminent or life-threatening deterioration of the following conditions:  Cardiac failure   Critical care was time spent personally by me on the following activities:  Development of treatment plan with patient or surrogate, discussions with consultants, evaluation of patient's response to treatment, examination of patient, obtaining history from patient or surrogate, review of old charts, re-evaluation of patient's condition, pulse oximetry, ordering and review of radiographic studies, ordering and review of laboratory studies and ordering and performing treatments and interventions   Care discussed with: admitting provider     (including critical care time)  Medical Decision Making / ED Course  Click here for ABCD2, HEART and other calculators  Medical Decision Making Amount and/or Complexity of Data Reviewed Labs: ordered. Decision-making details documented in ED Course. Radiology: ordered and independent interpretation performed. Decision-making details documented in ED Course. ECG/medicine tests: ordered and independent interpretation performed. Decision-making details documented in ED Course.  Risk Prescription drug management. Drug therapy requiring intensive monitoring for toxicity. Decision regarding hospitalization.    Cardiac  arrest ACLS continued Intubation for to secure airway. After several rounds of ACLS, ROSC was obtained Patient started on Levophed drip. Required additional pushes of epinephrine. Started on epi drip.  EKG with sinus tachycardia and PVCs.  No evidence of STEMI. Chest x-ray with good ET tube positioning. Evidence of likely aspiration.  Family updated on patient's status and unfavorable prognosis. Patient's wife decided to  continue with medication management but no additional chest compressions.  Patient is maxed out on Levophed and epinephrine.  Maintaining maps of 55. Family brought back to visit with family. Patient more responsive and following commands. Will consult ICU for continued management.        Final Clinical Impression(s) / ED Diagnoses Final diagnoses:  Cardiac arrest           This chart was dictated using voice recognition software.  Despite best efforts to proofread,  errors can occur which can change the documentation meaning.    Fatima Blank, MD 08/10/22 [phone]

## 2022-08-10 NOTE — Procedures (Signed)
Central Venous Catheter Insertion Procedure Note  Kurt HOKE  [id number]  1946/03/28  Date:08/10/22  Time:9:36 AM   Provider Performing:Zaccheus Edmister Alfredo Martinez   Procedure: Insertion of Non-tunneled Central Venous [id number]) with US guidance [id number])   Indication(s) Difficult access  Consent Risks of the procedure as well as the alternatives and risks of each were explained to the patient and/or caregiver.  Consent for the procedure was obtained and is signed in the bedside chart  Anesthesia Topical only with 1% lidocaine   Timeout Verified patient identification, verified procedure, site/side was marked, verified correct patient position, special equipment/implants available, medications/allergies/relevant history reviewed, required imaging and test results available.  Sterile Technique Maximal sterile technique including full sterile barrier drape, hand hygiene, sterile gown, sterile gloves, mask, hair covering, sterile ultrasound probe cover (if used).  Procedure Description Area of catheter insertion was cleaned with chlorhexidine and draped in sterile fashion.  With real-time ultrasound guidance a central venous catheter was placed into the right internal jugular vein. Nonpulsatile blood flow and easy flushing noted in all ports.  The catheter was sutured in place and sterile dressing applied.     Initial attempt of L IJ.  Unable to pass wire x2 attempts past ~ 10-12cm  / resistance met.  Aborted attempt on left. Sliding lung present.  New kit / re-draped patient, new sterile field.   Complications/Tolerance None; patient tolerated the procedure well. Chest X-ray is ordered to verify placement for internal jugular or subclavian cannulation.   Chest x-ray is not ordered for femoral cannulation.  EBL Minimal  Specimen(s) None   Noe Gens, MSN, APRN, NP-C, AGACNP-BC [facility] 08/10/2022, 9:37 AM   Please see [email or web address] for pager details.    From 7A-7P if no response, please call [phone] After hours, please call ELink [phone]

## 2022-08-10 NOTE — Progress Notes (Signed)
Echocardiogram 2D Echocardiogram has been performed.  Kurt Blair 08/10/2022, 11:11 AM

## 2022-08-10 NOTE — Significant Event (Signed)
Result of CTA chest PE protocol positive for acute PE.  Primary right lower lobe.  Burden of clot seems relatively low however does have RV strain, multi pressor shock, cardiac arrest.  Would classify as massive PE.  Discussed with wife and 2 stepsons at bedside.  Discussed role and risk of tenecteplase or massive PE.  There are no contraindications on questioning of his history, surgical history etc.  We agreed to move forward with tenecteplase.  50 mg IV ordered.  Heparin to follow.  Order set was used for the orders.

## 2022-08-10 NOTE — Progress Notes (Signed)
   08/10/22 0551  Spiritual Encounters  Type of Visit Initial  Care provided to: Pt and family  Conversation partners present during encounter Nurse;Physician  Referral source Nurse (RN/NT/LPN)  Reason for visit Urgent spiritual support  OnCall Visit Yes  Interventions  Spiritual Care Interventions Made Reflective listening;Compassionate presence;Bereavement/grief support;Prayer;Encouragement   Chaplain responded to page from DR that PT was at EOL. Family was present and DR requested Chaplain come be with them for support.  Chaplain arrived at Trauma B and prayed for PT. Chaplain then went to support family in the conference room and bring them back to  PT.  Chaplain encouraged PT's wife to talk to him and hold his hand.  Family surrounded PT and Chaplain prayed.  Chaplain was asked by medical team to escort family back to consult room as they continued work.  PT began to respond to treatments and was eventually taken to 2M04.  Chaplain escorted family to the floor waiting area and informed RN of their presence.

## 2022-08-10 NOTE — Progress Notes (Signed)
Pt transported to and from CT without event.  

## 2022-08-10 NOTE — Progress Notes (Signed)
Attempted Echocardiogram, central line is about to begin, will try back later.

## 2022-08-10 NOTE — Progress Notes (Signed)
ETT advanced from 22 to 24 at the lip per MD order.

## 2022-08-10 NOTE — Code Documentation (Signed)
Pulses present  

## 2022-08-10 NOTE — ED Triage Notes (Signed)
Witnessed cardiac arrest   Hx of CHF  EMS: CPR 0349 ROSC 0405 CPR 0410 ROSC 0418 3 epi 2.5 versed 50 fent

## 2022-08-10 NOTE — H&P (Addendum)
NAME:  Kurt Blair, MRN:  [id number], DOB:  1945/09/12, LOS: 0 ADMISSION DATE:  08/10/2022, CONSULTATION DATE:  08/10/22 REFERRING MD:  Leonette Monarch - EM, CHIEF COMPLAINT:  cardiac arrest    History of Present Illness:  77 y/o M PMH diastolic HF, COPD, HTN, PTSD, etoh abuse presented to ED following witnessed arrest at home while walking to bathroom. With EMS -- CPR [phone], [phone]. It sounds like pulses were lost a third time just prior to arrival and pt required several rounds of ACLS, though duration is not clear.   Intubated in ED. Remained shock after ROSC, req NE and Epi. Woke and was purposeful in ER.   Per report, he was SOB preceding arrest for several days (?up to 2 wks).  Was seen by his PCP 3/25 with reports of shortness of breath at that time, but unchanged for 10 years, mild intermittent cough with LE edema.   Pertinent  Medical History  COPD Diastolic HF HTN  Etoh Abuse PTSD   Significant Hospital Events: Including procedures, antibiotic start and stop dates in addition to other pertinent events   4/1 admit to ICU following witnessed OOH arrest x2, and subsequent ED arrest   Interim History / Subjective:  On high dose epi and Ne gtt   Most labs are not back   Objective   Blood pressure (!) 84/45, pulse 60, resp. rate (!) 22, SpO2 (!) 71 %.    Vent Mode: PRVC FiO2 (%):  [100 %] 100 % Set Rate:  [18 bmp] 18 bmp Vt Set:  [520 mL] 520 mL PEEP:  [10 cmH20] 10 cmH20  No intake or output data in the 24 hours ending 08/10/22 [id number] There were no vitals filed for this visit.  Examination: General: chronically ill appearing adult male lying in bed on vent, pressors HENT: MM pink/moist, pupils =/reactive, anicteric, ETT Lungs: non-labored at rest, lungs bilaterally diminished but clear Cardiovascular: s1s2 RRR, SR 60's on monitor, no appreciable m/r/g Abdomen: protuberant, NGT in place with blood drainage Extremities: warm/dry, BLE 3+ edema, LLE with compression  stocking in place  Neuro: opens eyes to calling name, moves all extremities spontaneously, winces with sternal touch of stethoscope   Resolved Hospital Problem list     Assessment & Plan:   Witnessed OOH Cardiac Arrest, PEA Shock  Rule Out PE About 15 min the ROSC, 10 min then ROSC with EMS, third arrest unclear duration in ED. Initial troponin negative.  Hx of PE in 2008, not on anticoagulation. Consider cardiogenic, obstructive, and less likely distributive shock.  -await CT head, CTA Chest  -Normothermia goal  -assess ECHO  -central accessed placed, remove I/O -assess Co-ox  -assess CVP  -assess UDS -follow up labs PM  Acute respiratory failure with hypoxemia and hypercarbia  COPD, rule out AECOPD   CAP vs Aspiration PNA R>L  Smoker COVID / extended RVP negative. PCT negative.  -PRVC with LTVV -increase PEEP / FiO3 for sats >90%  -assess strep antigen, legionella  -with concern for potential HF, change abx to ceftriaxone given Na in unasyn  -duoneb Q6, albuterol  -follow tracheal aspirate  -follow up intermittent CXR  -RASS Goal -3 to -4, may need paralytics   Hx Diastolic HF, NYHA Class 1 Abnormal EKG -- abnormal T waves anterolateral leads, troponin negative. Recently started on Coreg. -BNP, ECHO, trops  -PRN EKG   -hold home benicar, demadex, coreg, lipitor   AKI Hyperkalemia -foley -strict I/O, mIVF   Elevated LFT's NASH -follow LFT's  -  anticipate shock liver post arrest   Leukocytosis Suspect reactive in setting of arrest -ceftriaxone -BC, trach asp, UA   Pre-Diabetes  -SSI   Hypothyroidism -not on meds  -TSH 4.6 3/25   Best Practice (right click and "Reselect all SmartList Selections" daily)  Diet/type: NPO DVT prophylaxis: prophylactic heparin  GI prophylaxis: PPI Lines: Central line Foley:  Yes, and it is still needed Code Status:  DNR Last date of multidisciplinary goals of care discussion: DNR, discussed with family per Dr. Duwayne Heck.   Continue full scope care.   Labs   CBC: Recent Labs  Lab 08/03/22 1213 08/10/22 0526 08/10/22 0622  WBC 6.4  --   --   NEUTROABS 4.1  --   --   HGB 18.3 Repeated and verified X2.* 18.7* 16.3  HCT 53.4* 55.0* 48.0  MCV 90.9  --   --   PLT 185.0  --   --     Basic Metabolic Panel: Recent Labs  Lab 08/03/22 1213 08/10/22 0526 08/10/22 0622  NA 140 138 137  K 4.0 5.1 5.2*  CL 95* 100  --   CO2 39*  --   --   GLUCOSE 95 153*  --   BUN 12 28*  --   CREATININE 1.01 1.90*  --   CALCIUM 9.9  --   --    GFR: Estimated Creatinine Clearance: 43.2 mL/min (A) (by C-G formula based on SCr of 1.9 mg/dL (H)). Recent Labs  Lab 08/03/22 1213  WBC 6.4    Liver Function Tests: Recent Labs  Lab 08/03/22 1213  AST 20  ALT 11  ALKPHOS 63  BILITOT 1.3*  PROT 7.7  ALBUMIN 4.2   No results for input(s): "LIPASE", "AMYLASE" in the last 168 hours. No results for input(s): "AMMONIA" in the last 168 hours.  ABG    Component Value Date/Time   PHART 7.081 (LL) 08/10/2022 0622   PCO2ART 105.1 (HH) 08/10/2022 0622   PO2ART 62 (L) 08/10/2022 0622   HCO3 31.3 (H) 08/10/2022 0622   TCO2 34 (H) 08/10/2022 0622   ACIDBASEDEF 3.0 (H) 08/10/2022 0622   O2SAT 79 08/10/2022 0622     Coagulation Profile: No results for input(s): "INR", "PROTIME" in the last 168 hours.  Cardiac Enzymes: No results for input(s): "CKTOTAL", "CKMB", "CKMBINDEX", "TROPONINI" in the last 168 hours.  HbA1C: Hgb A1c MFr Bld  Date/Time Value Ref Range Status  08/03/2022 12:13 PM 6.4 4.6 - 6.5 % Final    Comment:    Glycemic Control Guidelines for People with Diabetes:Non Diabetic:  <6%Goal of Therapy: <7%Additional Action Suggested:  >8%   02/12/2022 02:31 PM 6.2 4.6 - 6.5 % Final    Comment:    Glycemic Control Guidelines for People with Diabetes:Non Diabetic:  <6%Goal of Therapy: <7%Additional Action Suggested:  >8%     CBG: No results for input(s): "GLUCAP" in the last 168 hours.  Review of  Systems:   Unable to complete as patient is on mechanical ventilation.   Past Medical History:  He,  has a past medical history of ANXIETY (02/16/2007), DEPRESSION (02/16/2007), Diverticulosis, ERECTILE DYSFUNCTION (02/16/2007), FATTY LIVER DISEASE (06/07/2008), HYPERGLYCEMIA (02/16/2007), Hyperlipidemia, Internal hemorrhoid, NASH (nonalcoholic steatohepatitis), PULMONARY EMBOLISM, HX OF (02/16/2007), SUBACROMIAL BURSITIS, RIGHT (06/07/2008), Tubular adenoma of colon (07/2001), and VENOUS INSUFFICIENCY (02/16/2007).   Surgical History:   Past Surgical History:  Procedure Laterality Date   COLONOSCOPY     Drainage fluid from chest  1990   Leg stripping  2003   Left  Social History:   reports that he quit smoking about 9 years ago. His smoking use included cigarettes. He has a 25.00 pack-year smoking history. He has never used smokeless tobacco. He reports current alcohol use of about 14.0 standard drinks of alcohol per week. He reports that he does not use drugs.   Family History:  His family history includes Cancer in his mother and sister. There is no history of Colon cancer, Esophageal cancer, Rectal cancer, Stomach cancer, Prostate cancer, or Pancreatic cancer.   Allergies Allergies  Allergen Reactions   Latex Hives and Rash     Home Medications  Prior to Admission medications   Medication Sig Start Date End Date Taking? Authorizing Provider  acetaminophen (TYLENOL) 650 MG CR tablet Take 650 mg by mouth every 8 (eight) hours as needed.    [provider]  albuterol (VENTOLIN HFA) 108 (90 Base) MCG/ACT inhaler TAKE 2 PUFFS BY MOUTH EVERY 6 HOURS AS NEEDED FOR WHEEZE OR SHORTNESS OF BREATH 01/21/22   Janith Lima, MD  atorvastatin (LIPITOR) 80 MG tablet TAKE 1 TABLET BY MOUTH EVERY DAY 04/25/22   Janith Lima, MD  Budeson-Glycopyrrol-Formoterol 160-9-4.8 MCG/ACT AERO Inhale 2 puffs into the lungs in the morning and at bedtime. 01/28/22   Janith Lima, MD  carvedilol  (COREG) 12.5 MG tablet TAKE 1 TABLET (12.5MG  TOTAL) BY MOUTH TWICE A DAY WITH MEALS 08/03/22   Janith Lima, MD  carvedilol (COREG) 12.5 MG tablet Take 1 tablet (12.5 mg total) by mouth 2 (two) times daily with a meal. 08/03/22   Janith Lima, MD  Cholecalciferol 50 MCG (2000 UT) TABS Take 1 tablet (2,000 Units total) by mouth daily. 10/17/19   Janith Lima, MD  folic acid (FOLVITE) 1 MG tablet TAKE 1 TABLET BY MOUTH EVERY DAY 07/19/22   Janith Lima, MD  KLOR-CON M20 20 MEQ tablet TAKE 1 TABLET BY MOUTH TWICE A DAY 02/15/22   Janith Lima, MD  levalbuterol [facility] HFA) 45 MCG/ACT inhaler Inhale 1 puff into the lungs every 6 (six) hours as needed for wheezing. 02/15/21   Janith Lima, MD  levocetirizine (XYZAL) 5 MG tablet TAKE 1 TABLET BY MOUTH EVERY DAY IN THE EVENING 02/15/22   Janith Lima, MD  Menthol, Topical Analgesic, (ICY HOT PAIN RELIEVING EX) Apply topically daily as needed.    [provider]  Multiple Vitamin (MULTIVITAMIN) tablet Take 1 tablet by mouth daily. Centrum silver - mens 50+    [provider]  olmesartan (BENICAR) 40 MG tablet TAKE 1 TABLET BY MOUTH EVERY DAY 04/25/22   Horald Pollen, MD  torsemide (DEMADEX) 20 MG tablet TAKE 1 TABLET BY MOUTH TWICE A DAY 04/25/22   Janith Lima, MD     Critical care time: 17 minutes    Noe Gens, MSN, APRN, NP-C, AGACNP-BC [facility] 08/10/2022, 7:28 AM   Please see [email or web address] for pager details.   From 7A-7P if no response, please call [phone] After hours, please call ELink [phone]

## 2022-08-11 ENCOUNTER — Inpatient Hospital Stay (HOSPITAL_COMMUNITY): Payer: Medicare Other

## 2022-08-11 DIAGNOSIS — I469 Cardiac arrest, cause unspecified: Secondary | ICD-10-CM | POA: Diagnosis not present

## 2022-08-11 LAB — CBC
HCT: 40.8 % (ref 39.0–52.0)
HCT: 37.4 % — ABNORMAL LOW (ref 39.0–52.0)
Hemoglobin: 14.3 g/dL (ref 13.0–17.0)
Hemoglobin: 13.2 g/dL (ref 13.0–17.0)
MCH: 30.2 pg (ref 26.0–34.0)
MCH: 30.2 pg (ref 26.0–34.0)
MCHC: 35 g/dL (ref 30.0–36.0)
MCHC: 35.3 g/dL (ref 30.0–36.0)
MCV: 86.3 fL (ref 80.0–100.0)
MCV: 85.6 fL (ref 80.0–100.0)
Platelets: 126 10*3/uL — ABNORMAL LOW (ref 150–400)
Platelets: 115 10*3/uL — ABNORMAL LOW (ref 150–400)
RBC: 4.73 MIL/uL (ref 4.22–5.81)
RBC: 4.37 MIL/uL (ref 4.22–5.81)
RDW: 13.9 % (ref 11.5–15.5)
RDW: 13.9 % (ref 11.5–15.5)
WBC: 14.6 10*3/uL — ABNORMAL HIGH (ref 4.0–10.5)
WBC: 13.4 10*3/uL — ABNORMAL HIGH (ref 4.0–10.5)
nRBC: 1.2 % — ABNORMAL HIGH (ref 0.0–0.2)
nRBC: 0.6 % — ABNORMAL HIGH (ref 0.0–0.2)

## 2022-08-11 LAB — GLUCOSE, CAPILLARY
Glucose-Capillary: 117 mg/dL — ABNORMAL HIGH (ref 70–99)
Glucose-Capillary: 136 mg/dL — ABNORMAL HIGH (ref 70–99)
Glucose-Capillary: 145 mg/dL — ABNORMAL HIGH (ref 70–99)
Glucose-Capillary: 152 mg/dL — ABNORMAL HIGH (ref 70–99)
Glucose-Capillary: 78 mg/dL (ref 70–99)
Glucose-Capillary: 88 mg/dL (ref 70–99)

## 2022-08-11 LAB — COMPREHENSIVE METABOLIC PANEL
ALT: 33 U/L (ref 0–44)
AST: 50 U/L — ABNORMAL HIGH (ref 15–41)
Albumin: 2.3 g/dL — ABNORMAL LOW (ref 3.5–5.0)
Alkaline Phosphatase: 52 U/L (ref 38–126)
Anion gap: 11 (ref 5–15)
BUN: 31 mg/dL — ABNORMAL HIGH (ref 8–23)
CO2: 29 mmol/L (ref 22–32)
Calcium: 7.4 mg/dL — ABNORMAL LOW (ref 8.9–10.3)
Chloride: 98 mmol/L (ref 98–111)
Creatinine, Ser: 2.8 mg/dL — ABNORMAL HIGH (ref 0.61–1.24)
GFR, Estimated: 23 mL/min — ABNORMAL LOW (ref >60)
Glucose, Bld: 137 mg/dL — ABNORMAL HIGH (ref 70–99)
Potassium: 5.6 mmol/L — ABNORMAL HIGH (ref 3.5–5.1)
Sodium: 138 mmol/L (ref 135–145)
Total Bilirubin: 2.2 mg/dL — ABNORMAL HIGH (ref 0.3–1.2)
Total Protein: 4.9 g/dL — ABNORMAL LOW (ref 6.5–8.1)

## 2022-08-11 LAB — BASIC METABOLIC PANEL
Anion gap: 10 (ref 5–15)
BUN: 34 mg/dL — ABNORMAL HIGH (ref 8–23)
CO2: 28 mmol/L (ref 22–32)
Calcium: 7.3 mg/dL — ABNORMAL LOW (ref 8.9–10.3)
Chloride: 98 mmol/L (ref 98–111)
Creatinine, Ser: 3.12 mg/dL — ABNORMAL HIGH (ref 0.61–1.24)
GFR, Estimated: 20 mL/min — ABNORMAL LOW (ref >60)
Glucose, Bld: 135 mg/dL — ABNORMAL HIGH (ref 70–99)
Potassium: 5.1 mmol/L (ref 3.5–5.1)
Sodium: 136 mmol/L (ref 135–145)

## 2022-08-11 LAB — POCT I-STAT 7, (LYTES, BLD GAS, ICA,H+H)
Acid-Base Excess: 4 mmol/L — ABNORMAL HIGH (ref 0.0–2.0)
Bicarbonate: 28 mmol/L (ref 20.0–28.0)
Calcium, Ion: 1.06 mmol/L — ABNORMAL LOW (ref 1.15–1.40)
HCT: 39 % (ref 39.0–52.0)
Hemoglobin: 13.3 g/dL (ref 13.0–17.0)
O2 Saturation: 99 %
Patient temperature: 38.5
Potassium: 5 mmol/L (ref 3.5–5.1)
Sodium: 134 mmol/L — ABNORMAL LOW (ref 135–145)
TCO2: 29 mmol/L (ref 22–32)
pCO2 arterial: 41.8 mmHg (ref 32–48)
pH, Arterial: 7.44 (ref 7.35–7.45)
pO2, Arterial: 143 mmHg — ABNORMAL HIGH (ref 83–108)

## 2022-08-11 LAB — HEPARIN LEVEL (UNFRACTIONATED)
Heparin Unfractionated: 0.46 IU/mL (ref 0.30–0.70)
Heparin Unfractionated: 0.45 IU/mL (ref 0.30–0.70)

## 2022-08-11 LAB — POTASSIUM: Potassium: 4.9 mmol/L (ref 3.5–5.1)

## 2022-08-11 MED ORDER — BUDESONIDE 0.25 MG/2ML IN SUSP
0.2500 mg | Freq: Two times a day (BID) | RESPIRATORY_TRACT | Status: DC
  Administered 2022-08-11 – 2022-08-20 (×19): 0.25 mg via RESPIRATORY_TRACT
  Filled 2022-08-11 (×19): qty 2

## 2022-08-11 MED ORDER — SENNA 8.6 MG PO TABS
1.0000 | ORAL_TABLET | Freq: Every day | ORAL | Status: DC
  Administered 2022-08-11: 8.6 mg
  Filled 2022-08-11: qty 1

## 2022-08-11 MED ORDER — VASOPRESSIN 20 UNITS/100 ML INFUSION FOR SHOCK
0.0300 [IU]/min | INTRAVENOUS | Status: DC
  Administered 2022-08-11 – 2022-08-13 (×4): 0.03 [IU]/min via INTRAVENOUS
  Filled 2022-08-11 (×5): qty 100

## 2022-08-11 MED ORDER — "THROMBI-PAD 3""X3"" EX PADS"
1.0000 | MEDICATED_PAD | Freq: Once | CUTANEOUS | Status: AC
  Administered 2022-08-11: 1 via TOPICAL
  Filled 2022-08-11 (×2): qty 1

## 2022-08-11 MED ORDER — INSULIN ASPART 100 UNIT/ML IV SOLN
5.0000 [IU] | Freq: Once | INTRAVENOUS | Status: AC
  Administered 2022-08-11: 5 [IU] via INTRAVENOUS

## 2022-08-11 MED ORDER — CALCIUM GLUCONATE-NACL 1-0.675 GM/50ML-% IV SOLN
1.0000 g | Freq: Once | INTRAVENOUS | Status: AC
  Administered 2022-08-11: 1000 mg via INTRAVENOUS
  Filled 2022-08-11: qty 50

## 2022-08-11 MED ORDER — BLISTEX MEDICATED EX OINT
TOPICAL_OINTMENT | CUTANEOUS | Status: DC | PRN
  Filled 2022-08-11: qty 6.3

## 2022-08-11 MED ORDER — SODIUM ZIRCONIUM CYCLOSILICATE 10 G PO PACK
10.0000 g | PACK | Freq: Once | ORAL | Status: AC
  Administered 2022-08-11: 10 g
  Filled 2022-08-11: qty 1

## 2022-08-11 MED ORDER — HYDROCORTISONE SOD SUC (PF) 100 MG IJ SOLR
100.0000 mg | Freq: Three times a day (TID) | INTRAMUSCULAR | Status: AC
  Administered 2022-08-11 – 2022-08-14 (×8): 100 mg via INTRAVENOUS
  Filled 2022-08-11 (×8): qty 2

## 2022-08-11 MED ORDER — REVEFENACIN 175 MCG/3ML IN SOLN
175.0000 ug | Freq: Every day | RESPIRATORY_TRACT | Status: DC
  Administered 2022-08-11 – 2022-08-20 (×10): 175 ug via RESPIRATORY_TRACT
  Filled 2022-08-11 (×10): qty 3

## 2022-08-11 MED ORDER — SODIUM CHLORIDE 0.9 % IV SOLN: INTRAVENOUS | Status: DC | PRN

## 2022-08-11 MED ORDER — DEXTROSE 50 % IV SOLN
1.0000 | Freq: Once | INTRAVENOUS | Status: AC
  Administered 2022-08-11: 50 mL via INTRAVENOUS
  Filled 2022-08-11: qty 50

## 2022-08-11 MED ORDER — ARFORMOTEROL TARTRATE 15 MCG/2ML IN NEBU
15.0000 ug | INHALATION_SOLUTION | Freq: Two times a day (BID) | RESPIRATORY_TRACT | Status: DC
  Administered 2022-08-11 – 2022-08-20 (×17): 15 ug via RESPIRATORY_TRACT
  Filled 2022-08-11 (×22): qty 2

## 2022-08-11 MED ORDER — ACETAMINOPHEN 500 MG PO TABS
1000.0000 mg | ORAL_TABLET | Freq: Three times a day (TID) | ORAL | Status: DC
  Administered 2022-08-11 – 2022-08-17 (×15): 1000 mg
  Filled 2022-08-11 (×18): qty 2

## 2022-08-11 MED ORDER — POLYETHYLENE GLYCOL 3350 17 G PO PACK
17.0000 g | PACK | Freq: Two times a day (BID) | ORAL | Status: DC
  Administered 2022-08-11 – 2022-08-16 (×8): 17 g
  Filled 2022-08-11 (×8): qty 1

## 2022-08-11 MED ORDER — HYDROCERIN EX CREA
TOPICAL_CREAM | Freq: Every day | CUTANEOUS | Status: DC
  Filled 2022-08-11 (×2): qty 113

## 2022-08-11 NOTE — Progress Notes (Signed)
Spoke with Elink due to concerns of wide range in cuff pressures and high pressor requirements and ABG due at 0500 with patient who received TNK at 1313 08/10/2022. Per Elink MD Dr. Jeannene Patella, due to TNK wait on ABG and potential Aline placement until closer to 24 hour mark.

## 2022-08-11 NOTE — Progress Notes (Addendum)
ANTICOAGULATION CONSULT NOTE   Pharmacy Consult for heparin infusion Indication: pulmonary embolus  Allergies  Allergen Reactions   Latex Hives and Rash    Patient Measurements: Weight: 124.2 kg (273 lb 13 oz)Total body weight: 121.6 kg Heparin Dosing Weight: 100.3 kg  Vital Signs: Temp: 101.1 F (38.4 C) (04/02 1454) Temp Source: Bladder (04/02 1600) BP: 109/61 (04/02 1454) Pulse Rate: 75 (04/02 1454)  Labs: Recent Labs    08/10/22 0615 08/10/22 0622 08/10/22 1400 08/10/22 1401 08/10/22 1506 08/10/22 2210 08/11/22 0445 08/11/22 1315 08/11/22 1345 08/11/22 1614  HGB 16.0   < >  --  16.3   < >  --  14.3 13.2 13.3  --   HCT 49.0   < >  --  46.8   < >  --  40.8 37.4* 39.0  --   PLT 178  --   --  127*  --   --  126* 115*  --   --   APTT 35  --   --  52*  --   --   --   --   --   --   LABPROT 16.6*  --   --  19.0*  --   --   --   --   --   --   INR 1.4*  --   --  1.6*  --   --   --   --   --   --   HEPARINUNFRC  --   --   --   --   --  0.31 0.46  --   --  0.45  CREATININE 1.89*  --   --  2.19*  --   --  2.80* 3.12*  --   --   TROPONINIHS 32*  --  161*  --   --   --   --   --   --   --    < > = values in this interval not displayed.     Estimated Creatinine Clearance: 26.6 mL/min (A) (by C-G formula based on SCr of 3.12 mg/dL (H)).   Assessment: 77 yo M admitted s/p cardiac arrest on high dose vasopressors. Pt was found to have pulmonary embolus with R heart strain on CT scan on 08/10/22. Given hemodynamic instability and clinical picture, tenecteplase 50mg  IV x1 was given 08/10/22 at 1313.   Heparin level remains therapeutic (0.45) on infusion at 1200 units/hr. Pt with some bleeding at IJ site - thrombipad applied - and seems to have resolved.  Goal of Therapy:  No bolus s/p TNK (4/1) HL goal 0.3-0.5 x24 hours (until 4/2 @ 1315) then Heparin level 0.3-0.7 units/ml Monitor platelets by anticoagulation protocol: Yes   Plan:  Continue heparin infusion at 1200 units/hr   Daily heparin level and CBC  F/u IJ site bleeding resolution and long term anticoag plans  Sherlon Handing, PharmD, BCPS Please see amion for complete clinical pharmacist phone list 08/11/2022 6:02 PM

## 2022-08-11 NOTE — Progress Notes (Addendum)
NAME:  Kurt Blair, MRN:  [id number], DOB:  [birth year], LOS: 1 ADMISSION DATE:  08/10/2022, CONSULTATION DATE:  08/10/22 REFERRING MD:  Leonette Monarch - EM, CHIEF COMPLAINT:  cardiac arrest    History of Present Illness:  77 y/o M PMH diastolic HF, COPD, HTN, PTSD, etoh abuse presented to ED following witnessed arrest at home while walking to bathroom. With EMS -- CPR [phone], [phone]. It sounds like pulses were lost a third time just prior to arrival and pt required several rounds of ACLS, though duration is not clear.   Intubated in ED. Remained shock after ROSC, req NE and Epi. Woke and was purposeful in ER.   Per report, he was SOB preceding arrest for several days (?up to 2 wks).  Was seen by his PCP 3/25 with reports of shortness of breath at that time, but unchanged for 10 years, mild intermittent cough with LE edema.   Pertinent  Medical History  COPD Diastolic HF HTN  Etoh Abuse PTSD   Significant Hospital Events: Including procedures, antibiotic start and stop dates in addition to other pertinent events   4/1 admit to ICU following witnessed OOH arrest x2, and subsequent ED arrest   Interim History / Subjective:  On high dose epi and Ne gtt   Most labs are not back   Objective   Blood pressure (!) 84/56, pulse (!) 104, temperature (!) 102.2 F (39 C), resp. rate (!) 26, weight 124.2 kg, SpO2 100 %. CVP:  [11 mmHg] 11 mmHg  Vent Mode: PRVC FiO2 (%):  [70 %-100 %] 70 % Set Rate:  [26 bmp] 26 bmp Vt Set:  [550 mL] 550 mL PEEP:  [12 cmH20] 12 cmH20 Plateau Pressure:  [26 cmH20-29 cmH20] 27 cmH20   Intake/Output Summary (Last 24 hours) at 08/11/2022 0825 Last data filed at 08/11/2022 [id number] Gross per 24 hour  Intake 3158.42 ml  Output 215 ml  Net 2943.42 ml   Filed Weights   08/11/22 0100  Weight: 124.2 kg    Examination: Physical Exam Constitutional:      General: He is not in acute distress.    Appearance: He is ill-appearing.  HENT:     Head: Normocephalic.   Eyes:     General:        Right eye: No discharge.        Left eye: No discharge.     Pupils: Pupils are equal, round, and reactive to light.  Cardiovascular:     Rate and Rhythm: Normal rate and regular rhythm.     Heart sounds: No murmur heard.    Comments: Trace to +1 edema of LE bilat Pulmonary:     Effort: No respiratory distress.     Breath sounds: Normal breath sounds.  Abdominal:     General: Bowel sounds are normal. There is no distension.     Palpations: Abdomen is soft.     Tenderness: There is no abdominal tenderness.  Skin:    General: Skin is warm.  Neurological:     Comments: Sedated.  Opening eyes to painful stimuli.     84/56, 105, 102.2 Vent Peep 5, 70% Fio2 100 Fent, 2 Versed, 40 Levo, 12 Epi. ?  Vaso +4.4L WBC 14.6, Hgb 14.3, Plt 126 K 5.6, BUN 31,  CVP 7  Resolved Hospital Problem list     Assessment & Plan:  Witnessed OOH Cardiac Arrest, PEA Massive pulmonary embolism PE predominantly on the right side with right heart strain RV/LV = 1.78.  However right heart strain may be overestimated.  Poor echocardiogram imaging but his EF is preserved.  He received TNK yesterday. -Continue IV heparin, currently therapeutic -Normothermia goal -Will need hypercoagulable/malignancy workup when more stable consider this is unprovoked PE.   Shock In the setting of cardiopulmonary collapse from PE.  He may have a component of sepsis from possible pneumonia. -Continue levo, epi and vasopressor -Will place an A-line today -Continue ceftriaxone empirically (day 2).  Nasal MRSA negative -Pending blood culture and sputum culture  Acute respiratory failure with hypoxemia and hypercarbia  COPD (PFT 2019, follows Dr. Melvyn Novas) CAP vs Aspiration PNA R>L  Remains intubated.  Still has high pressors requirement. -PRVC with LTVV.  SBT if able -He is on Breztri at home.  Start Brovana, Pulmicort and Yuperi. Duobnebs PRN.  No indication for steroid -Continue ceftriaxone  empirically for CAP  Hx Diastolic HF, NYHA Class 1 Abnormal EKG -- abnormal T waves anterolateral leads, troponin negative.  Echocardiogram showed preserved EF. -hold home benicar, demadex, coreg in the setting of shock -Hold off on diuresis today.  CVP is good at 7.   AKI Hyperkalemia Suspect ATN in the setting of shock.  Minimal urine output overnight despite IV fluid. -Would not attempt Lasix challenge today with normal CVP.  Need to be cautious in setting of right heart failure/preload dependent from PE.  -Trend BMP.  If no improvement of renal function, will need to consult nephrology for possible.  His wife cannot decide if he should start CRRT when indicated.  She had bad experience from a family member on dialysis before.  She will talk to his sister about this decision.  Elevated LFT's NASH -follow LFT's  -anticipate shock liver post arrest   Pre-Diabetes  -SSI   Hypothyroidism -not on meds  -TSH 4.6 3/25   Best Practice (right click and "Reselect all SmartList Selections" daily)  Diet/type: NPO DVT prophylaxis: heparin Gtt GI prophylaxis: PPI Lines: Central line Foley:  Yes, and it is still needed Code Status:  DNR Last date of multidisciplinary goals of care discussion: DNR. Updated spouse via phone 4/2 Labs   CBC: Recent Labs  Lab 08/10/22 0615 08/10/22 0622 08/10/22 0817 08/10/22 0826 08/10/22 1401 08/10/22 1506 08/11/22 0445  WBC 18.0*  --   --   --  14.2*  --  14.6*  HGB 16.0   < > 17.0 17.0 16.3 16.3 14.3  HCT 49.0   < > 50.0 50.0 46.8 48.0 40.8  MCV 93.9  --   --   --  87.5  --  86.3  PLT 178  --   --   --  127*  --  126*   < > = values in this interval not displayed.     Basic Metabolic Panel: Recent Labs  Lab 08/10/22 0526 08/10/22 0615 08/10/22 0622 08/10/22 0817 08/10/22 0826 08/10/22 1401 08/10/22 1506 08/11/22 0445  NA 138 138   < > 138 137 138 138 138  K 5.1 6.0*   < > 5.2* 5.1 4.6 4.9 5.6*  CL 100 96*  --   --   --  95*  --   98  CO2  --  26  --   --   --  30  --  29  GLUCOSE 153* 190*  --   --   --  122*  --  137*  BUN 28* 22  --   --   --  25*  --  31*  CREATININE  1.90* 1.89*  --   --   --  2.19*  --  2.80*  CALCIUM  --  8.5*  --   --   --  7.8*  --  7.4*  MG  --  2.1  --   --   --   --   --   --   PHOS  --  6.6*  --   --   --   --   --   --    < > = values in this interval not displayed.    GFR: Estimated Creatinine Clearance: 29.7 mL/min (A) (by C-G formula based on SCr of 2.8 mg/dL (H)). Recent Labs  Lab 08/10/22 0615 08/10/22 0642 08/10/22 1401 08/10/22 1402 08/11/22 0445  PROCALCITON <0.10  --   --   --   --   WBC 18.0*  --  14.2*  --  14.6*  LATICACIDVEN 5.5* 7.7*  --  2.2*  --      Liver Function Tests: Recent Labs  Lab 08/10/22 0615 08/11/22 0445  AST 94* 50*  ALT 46* 33  ALKPHOS 80 52  BILITOT 1.2 2.2*  PROT 5.6* 4.9*  ALBUMIN 2.6* 2.3*    No results for input(s): "LIPASE", "AMYLASE" in the last 168 hours. No results for input(s): "AMMONIA" in the last 168 hours.  ABG    Component Value Date/Time   PHART 7.406 08/10/2022 1506   PCO2ART 48.7 (H) 08/10/2022 1506   PO2ART 86 08/10/2022 1506   HCO3 30.2 (H) 08/10/2022 1506   TCO2 32 08/10/2022 1506   ACIDBASEDEF 3.0 (H) 08/10/2022 0622   O2SAT 74.8 08/10/2022 1625     Coagulation Profile: Recent Labs  Lab 08/10/22 0615 08/10/22 1401  INR 1.4* 1.6*    Cardiac Enzymes: No results for input(s): "CKTOTAL", "CKMB", "CKMBINDEX", "TROPONINI" in the last 168 hours.  HbA1C: Hgb A1c MFr Bld  Date/Time Value Ref Range Status  08/03/2022 12:13 PM 6.4 4.6 - 6.5 % Final    Comment:    Glycemic Control Guidelines for People with Diabetes:Non Diabetic:  <6%Goal of Therapy: <7%Additional Action Suggested:  >8%   02/12/2022 02:31 PM 6.2 4.6 - 6.5 % Final    Comment:    Glycemic Control Guidelines for People with Diabetes:Non Diabetic:  <6%Goal of Therapy: <7%Additional Action Suggested:  >8%     CBG: Recent Labs  Lab  08/10/22 1930 08/10/22 2210 08/10/22 2331 08/11/22 0319 08/11/22 0717  GLUCAP 72 118* 119* 78 145*    Review of Systems:   Unable to complete as patient is on mechanical ventilation.   Past Medical History:  He,  has a past medical history of ANXIETY (02/16/2007), DEPRESSION (02/16/2007), Diverticulosis, ERECTILE DYSFUNCTION (02/16/2007), FATTY LIVER DISEASE (06/07/2008), HYPERGLYCEMIA (02/16/2007), Hyperlipidemia, Internal hemorrhoid, NASH (nonalcoholic steatohepatitis), PULMONARY EMBOLISM, HX OF (02/16/2007), SUBACROMIAL BURSITIS, RIGHT (06/07/2008), Tubular adenoma of colon (07/2001), and VENOUS INSUFFICIENCY (02/16/2007).   Surgical History:   Past Surgical History:  Procedure Laterality Date   COLONOSCOPY     Drainage fluid from chest  1990   Leg stripping  2003   Left      Social History:   reports that he quit smoking about 9 years ago. His smoking use included cigarettes. He has a 25.00 pack-year smoking history. He has never used smokeless tobacco. He reports current alcohol use of about 14.0 standard drinks of alcohol per week. He reports that he does not use drugs.   Family History:  His family history includes Cancer in his  mother and sister. There is no history of Colon cancer, Esophageal cancer, Rectal cancer, Stomach cancer, Prostate cancer, or Pancreatic cancer.   Allergies Allergies  Allergen Reactions   Latex Hives and Rash     Home Medications  Prior to Admission medications   Medication Sig Start Date End Date Taking? Authorizing Provider  acetaminophen (TYLENOL) 650 MG CR tablet Take 650 mg by mouth every 8 (eight) hours as needed.    [provider]  albuterol (VENTOLIN HFA) 108 (90 Base) MCG/ACT inhaler TAKE 2 PUFFS BY MOUTH EVERY 6 HOURS AS NEEDED FOR WHEEZE OR SHORTNESS OF BREATH 01/21/22   Janith Lima, MD  atorvastatin (LIPITOR) 80 MG tablet TAKE 1 TABLET BY MOUTH EVERY DAY 04/25/22   Janith Lima, MD  Budeson-Glycopyrrol-Formoterol 160-9-4.8  MCG/ACT AERO Inhale 2 puffs into the lungs in the morning and at bedtime. 01/28/22   Janith Lima, MD  carvedilol (COREG) 12.5 MG tablet TAKE 1 TABLET (12.5MG  TOTAL) BY MOUTH TWICE A DAY WITH MEALS 08/03/22   Janith Lima, MD  carvedilol (COREG) 12.5 MG tablet Take 1 tablet (12.5 mg total) by mouth 2 (two) times daily with a meal. 08/03/22   Janith Lima, MD  Cholecalciferol 50 MCG (2000 UT) TABS Take 1 tablet (2,000 Units total) by mouth daily. 10/17/19   Janith Lima, MD  folic acid (FOLVITE) 1 MG tablet TAKE 1 TABLET BY MOUTH EVERY DAY 07/19/22   Janith Lima, MD  KLOR-CON M20 20 MEQ tablet TAKE 1 TABLET BY MOUTH TWICE A DAY 02/15/22   Janith Lima, MD  levalbuterol [facility] HFA) 45 MCG/ACT inhaler Inhale 1 puff into the lungs every 6 (six) hours as needed for wheezing. 02/15/21   Janith Lima, MD  levocetirizine (XYZAL) 5 MG tablet TAKE 1 TABLET BY MOUTH EVERY DAY IN THE EVENING 02/15/22   Janith Lima, MD  Menthol, Topical Analgesic, (ICY HOT PAIN RELIEVING EX) Apply topically daily as needed.    [provider]  Multiple Vitamin (MULTIVITAMIN) tablet Take 1 tablet by mouth daily. Centrum silver - mens 50+    [provider]  olmesartan (BENICAR) 40 MG tablet TAKE 1 TABLET BY MOUTH EVERY DAY 04/25/22   Horald Pollen, MD  torsemide (DEMADEX) 20 MG tablet TAKE 1 TABLET BY MOUTH TWICE A DAY 04/25/22   Janith Lima, MD     Critical care time:    Gaylan Gerold, DO Internal Medicine Residency My pager: [phone]

## 2022-08-11 NOTE — Progress Notes (Signed)
[place] Progress Note Patient Name: Kurt Blair DOB: [birth year] MRN: [id number]   Date of Service  08/11/2022  HPI/Events of Note  K is 5.6 with worsening renal function. On pressors. Also has fluctuating Bps but cuff able to obtain BP for the most part.   eICU Interventions  Calcium, insulin /d50 and lokelma x 1 Recheck K in 4 hours Would consider an art line today so that Bps can be taken reliably, did get thrombolysis yesterday afternoon so would prefer to get the art line with day team supervision      Intervention Category Major Interventions: Shock - evaluation and management  Margaretmary Lombard 08/11/2022, 6:13 AM

## 2022-08-11 NOTE — Progress Notes (Signed)
ANTICOAGULATION CONSULT NOTE - Follow Up Consult  Pharmacy Consult for heparin Indication: pulmonary embolus  Labs: Recent Labs    08/10/22 0526 08/10/22 0615 08/10/22 0622 08/10/22 0826 08/10/22 1400 08/10/22 1401 08/10/22 1506 08/10/22 2210  HGB 18.7* 16.0   < > 17.0  --  16.3 16.3  --   HCT 55.0* 49.0   < > 50.0  --  46.8 48.0  --   PLT  --  178  --   --   --  127*  --   --   APTT  --  35  --   --   --  52*  --   --   LABPROT  --  16.6*  --   --   --  19.0*  --   --   INR  --  1.4*  --   --   --  1.6*  --   --   HEPARINUNFRC  --   --   --   --   --   --   --  0.31  CREATININE 1.90* 1.89*  --   --   --  2.19*  --   --   TROPONINIHS  --  32*  --   --  161*  --   --   --    < > = values in this interval not displayed.    Assessment/Plan:  77yo male therapeutic on heparin with initial dosing s/p tenecteplase . Will continue infusion at current rate of 1250 units/hr and confirm stable with am labs.   Wynona Neat, PharmD, BCPS  08/11/2022,12:16 AM

## 2022-08-11 NOTE — Procedures (Signed)
Arterial Line Insertion Start/End4/06/2022 1:30 PM, 08/11/2022 1:44 PM  Patient location: ICU. Preanesthetic checklist: patient identified, site marked, surgical consent, monitors and equipment checked and timeout performed Right, radial was placed Catheter size: 20 G Hand hygiene performed  and maximum sterile barriers used  Allen's test indicative of satisfactory collateral circulation Attempts: 1 Procedure performed without using ultrasound guided technique. Following insertion, dressing applied and Biopatch. Post procedure assessment: normal  Patient tolerated the procedure well with no immediate complications.

## 2022-08-11 NOTE — Consult Note (Addendum)
[place] Nurse Consult Note: Reason for Consult: Consult requested for chest and bilat legs. Pt is critically ill Wound type:  Middle chest with red moist partial thickness wound; 5X3X.1cm and circular Bilat legs with generalized edema, left leg with dark dry crusted skin to anterior and posterior calf.  Left leg with partial thickness wound, 1X1X.1cm, red and moist Left knee with partial thickness wound, 2X1X.1cm, red and moist Left great toe and 5th toe with dry callous areas, raised above skin level. Right leg and thigh with patchy areas of red moist partial thickness wounds, mod amt yellow drainage Dressing procedure/placement/frequency: Topical treatment orders provided for bedside nurses to perform as follows to protect from further injury and promote healing: 1. Apply Eucerin cream to left leg Q day 2. Foam dressings to middle chest, left and right legs, change q 3 days or PRN soiling 3. Left toe callous areas do not require topical treatment. Please re-consult if further assistance is needed.  Thank-you,  Julien Girt MSN, [place], [place], [place], [place]

## 2022-08-11 NOTE — Progress Notes (Signed)
ANTICOAGULATION CONSULT NOTE - Initial Consult  Pharmacy Consult for heparin infusion Indication: pulmonary embolus  Allergies  Allergen Reactions   Latex Hives and Rash    Patient Measurements: Weight: 124.2 kg (273 lb 13 oz)Total body weight: 121.6 kg Heparin Dosing Weight: 100.3 kg  Vital Signs: Temp: 102.2 F (39 C) (04/02 0705) Temp Source: Bladder (04/02 0400) BP: 84/56 (04/02 0705) Pulse Rate: 104 (04/02 0705)  Labs: Recent Labs    08/10/22 0615 08/10/22 0622 08/10/22 1400 08/10/22 1401 08/10/22 1506 08/10/22 2210 08/11/22 0445  HGB 16.0   < >  --  16.3 16.3  --  14.3  HCT 49.0   < >  --  46.8 48.0  --  40.8  PLT 178  --   --  127*  --   --  126*  APTT 35  --   --  52*  --   --   --   LABPROT 16.6*  --   --  19.0*  --   --   --   INR 1.4*  --   --  1.6*  --   --   --   HEPARINUNFRC  --   --   --   --   --  0.31 0.46  CREATININE 1.89*  --   --  2.19*  --   --  2.80*  TROPONINIHS 32*  --  161*  --   --   --   --    < > = values in this interval not displayed.     Estimated Creatinine Clearance: 29.7 mL/min (A) (by C-G formula based on SCr of 2.8 mg/dL (H)).   Medical History: Past Medical History:  Diagnosis Date   ANXIETY 02/16/2007   DEPRESSION 02/16/2007   Diverticulosis    ERECTILE DYSFUNCTION 02/16/2007   FATTY LIVER DISEASE 06/07/2008   HYPERGLYCEMIA 02/16/2007   Hyperlipidemia    Internal hemorrhoid    NASH (nonalcoholic steatohepatitis)    PULMONARY EMBOLISM, HX OF 02/16/2007   SUBACROMIAL BURSITIS, RIGHT 06/07/2008   Tubular adenoma of colon 07/2001   VENOUS INSUFFICIENCY 02/16/2007    Assessment: 77 yo M admitted s/p cardiac arrest on high dose vasopressors. Pt was found to have pulmonary embolus with R heart strain on CT scan on 08/10/22. Given hemodynamic instability and clinical picture, tenecteplase 50mg  IV x1 was given 08/10/22 at 1313.   Pharmacy consulted to dose heparin drip after TNK. To begin once aPTT <80s.   HL 0.46 - therapeutic    Goal of Therapy:  No bolus s/p TNK (4/1) HL goal 0.3-0.5 x24 hours (until 4/2 @ 1315) then Heparin level 0.3-0.7 units/ml Monitor platelets by anticoagulation protocol: Yes   Plan:  Reduce heparin infusion slightly to 1200 units/hr given rate of rise 8hr HL at 1600 Daily HL, CBC  F/u s/sx bleeding and long term anticoag plans   Wilson Singer, PharmD Clinical Pharmacist 08/11/2022 7:06 AM

## 2022-08-12 ENCOUNTER — Inpatient Hospital Stay (HOSPITAL_COMMUNITY): Payer: Medicare Other

## 2022-08-12 DIAGNOSIS — I469 Cardiac arrest, cause unspecified: Secondary | ICD-10-CM | POA: Diagnosis not present

## 2022-08-12 LAB — GLUCOSE, CAPILLARY
Glucose-Capillary: 174 mg/dL — ABNORMAL HIGH (ref 70–99)
Glucose-Capillary: 182 mg/dL — ABNORMAL HIGH (ref 70–99)
Glucose-Capillary: 157 mg/dL — ABNORMAL HIGH (ref 70–99)
Glucose-Capillary: 139 mg/dL — ABNORMAL HIGH (ref 70–99)
Glucose-Capillary: 128 mg/dL — ABNORMAL HIGH (ref 70–99)
Glucose-Capillary: 124 mg/dL — ABNORMAL HIGH (ref 70–99)

## 2022-08-12 LAB — CBC
HCT: 32.8 % — ABNORMAL LOW (ref 39.0–52.0)
Hemoglobin: 12 g/dL — ABNORMAL LOW (ref 13.0–17.0)
MCH: 30.9 pg (ref 26.0–34.0)
MCHC: 36.6 g/dL — ABNORMAL HIGH (ref 30.0–36.0)
MCV: 84.5 fL (ref 80.0–100.0)
Platelets: 122 10*3/uL — ABNORMAL LOW (ref 150–400)
RBC: 3.88 MIL/uL — ABNORMAL LOW (ref 4.22–5.81)
RDW: 14 % (ref 11.5–15.5)
WBC: 10.7 10*3/uL — ABNORMAL HIGH (ref 4.0–10.5)
nRBC: 0.2 % (ref 0.0–0.2)

## 2022-08-12 LAB — BRAIN NATRIURETIC PEPTIDE: Brain Natriuretic Peptide: 80 pg/mL (ref <100)

## 2022-08-12 LAB — BASIC METABOLIC PANEL
Anion gap: 13 (ref 5–15)
BUN: 38 mg/dL — ABNORMAL HIGH (ref 8–23)
CO2: 24 mmol/L (ref 22–32)
Calcium: 7.3 mg/dL — ABNORMAL LOW (ref 8.9–10.3)
Chloride: 98 mmol/L (ref 98–111)
Creatinine, Ser: 2.85 mg/dL — ABNORMAL HIGH (ref 0.61–1.24)
GFR, Estimated: 22 mL/min — ABNORMAL LOW (ref >60)
Glucose, Bld: 168 mg/dL — ABNORMAL HIGH (ref 70–99)
Potassium: 4.7 mmol/L (ref 3.5–5.1)
Sodium: 135 mmol/L (ref 135–145)

## 2022-08-12 LAB — COOXEMETRY PANEL
Carboxyhemoglobin: 2.9 % — ABNORMAL HIGH (ref 0.5–1.5)
Carboxyhemoglobin: 3.5 % — ABNORMAL HIGH (ref 0.5–1.5)
Methemoglobin: <0.7 % (ref 0.0–1.5)
Methemoglobin: <0.7 % (ref 0.0–1.5)
O2 Saturation: 53.1 %
O2 Saturation: 58.3 %
Total hemoglobin: 11.2 g/dL — ABNORMAL LOW (ref 12.0–16.0)
Total hemoglobin: 10.7 g/dL — ABNORMAL LOW (ref 12.0–16.0)

## 2022-08-12 LAB — HEPARIN LEVEL (UNFRACTIONATED): Heparin Unfractionated: 0.42 IU/mL (ref 0.30–0.70)

## 2022-08-12 LAB — TROPONIN T, HIGH SENSITIVITY (HS-TNT): Troponin T (Highly Sensitive): 34 ng/L (ref <23)

## 2022-08-12 LAB — LEGIONELLA PNEUMOPHILA SEROGP 1 UR AG: L. pneumophila Serogp 1 Ur Ag: NEGATIVE

## 2022-08-12 MED ORDER — SENNA 8.6 MG PO TABS
1.0000 | ORAL_TABLET | Freq: Two times a day (BID) | ORAL | Status: DC
  Administered 2022-08-12 – 2022-08-16 (×9): 8.6 mg
  Filled 2022-08-12 (×7): qty 1

## 2022-08-12 MED ORDER — VITAL 1.5 CAL PO LIQD
1000.0000 mL | ORAL | Status: DC
  Administered 2022-08-12: 1000 mL

## 2022-08-12 MED ORDER — OXIDIZED CELLULOSE EX PADS
1.0000 | MEDICATED_PAD | Freq: Once | CUTANEOUS | Status: AC
  Administered 2022-08-12: 1 via TOPICAL
  Filled 2022-08-12: qty 1

## 2022-08-12 MED ORDER — IPRATROPIUM-ALBUTEROL 0.5-2.5 (3) MG/3ML IN SOLN: 3.0000 mL | Freq: Four times a day (QID) | RESPIRATORY_TRACT | Status: DC | PRN

## 2022-08-12 NOTE — Progress Notes (Signed)
ANTICOAGULATION CONSULT NOTE   Pharmacy Consult for heparin infusion Indication: pulmonary embolus  Allergies  Allergen Reactions   Latex Hives and Rash    Patient Measurements: Weight: 127.9 kg (281 lb 15.5 oz)Total body weight: 121.6 kg Heparin Dosing Weight: 100.3 kg  Vital Signs: Temp: 99.3 F (37.4 C) (04/03 0700) Temp Source: Bladder (04/03 0400) Pulse Rate: 63 (04/03 0700)  Labs: Recent Labs    08/10/22 0615 08/10/22 0622 08/10/22 1400 08/10/22 1401 08/10/22 1506 08/11/22 0445 08/11/22 1315 08/11/22 1345 08/11/22 1614 08/12/22 0350  HGB 16.0   < >  --  16.3   < > 14.3 13.2 13.3  --  12.0*  HCT 49.0   < >  --  46.8   < > 40.8 37.4* 39.0  --  32.8*  PLT 178  --   --  127*  --  126* 115*  --   --  122*  APTT 35  --   --  52*  --   --   --   --   --   --   LABPROT 16.6*  --   --  19.0*  --   --   --   --   --   --   INR 1.4*  --   --  1.6*  --   --   --   --   --   --   HEPARINUNFRC  --   --   --   --    < > 0.46  --   --  0.45 0.42  CREATININE 1.89*  --   --  2.19*  --  2.80* 3.12*  --   --  2.85*  TROPONINIHS 32*  --  161*  --   --   --   --   --   --   --    < > = values in this interval not displayed.     Estimated Creatinine Clearance: 29.6 mL/min (A) (by C-G formula based on SCr of 2.85 mg/dL (H)).   Assessment: 77 yo M admitted s/p cardiac arrest on high dose vasopressors. Pt was found to have pulmonary embolus with R heart strain on CT scan on 08/10/22. Given hemodynamic instability and clinical picture, tenecteplase 50mg  IV x1 was given 08/10/22 at 1313.   Heparin level remains therapeutic (0.42) on infusion at 1200 units/hr. Pt with some bleeding at IJ site - thrombipad applied - and seems to have resolved.  Goal of Therapy:  No bolus s/p TNK (4/1) Heparin level 0.3-0.7 units/ml Monitor platelets by anticoagulation protocol: Yes   Plan:  Continue heparin infusion at 1200 units/hr  Daily heparin level and CBC  F/u IJ site bleeding resolution and  long term anticoag plans  Wilson Singer, PharmD Clinical Pharmacist 08/12/2022 7:32 AM

## 2022-08-12 NOTE — Procedures (Signed)
Cortrak  Person Inserting Tube:  Rachid Parham T, RD Tube Type:  Cortrak - 43 inches Tube Size:  10 Tube Location:  Left nare Secured by: Bridle Technique Used to Measure Tube Placement:  Marking at nare/corner of mouth Cortrak Secured At:  70 cm   Cortrak Tube Team Note:  Consult received to place a Cortrak feeding tube.   X-ray is required, abdominal x-ray has been ordered by the Cortrak team. Please confirm tube placement before using the Cortrak tube.   If the tube becomes dislodged please keep the tube and contact the Cortrak team at [email or web address] for replacement.  If after hours and replacement cannot be delayed, place a NG tube and confirm placement with an abdominal x-ray.    Samson Frederic RD, LDN For contact information, refer to [facility].

## 2022-08-12 NOTE — Progress Notes (Signed)
NAME:  Kurt Blair, MRN:  [id number], DOB:  09/07/1945, LOS: 2 ADMISSION DATE:  08/10/2022, CONSULTATION DATE:  08/10/22 REFERRING MD:  Leonette Monarch - EM, CHIEF COMPLAINT:  cardiac arrest    History of Present Illness:  77 y/o M PMH diastolic HF, COPD, HTN, PTSD, etoh abuse presented to ED following witnessed arrest at home while walking to bathroom. With EMS -- CPR [phone], [phone]. It sounds like pulses were lost a third time just prior to arrival and pt required several rounds of ACLS, though duration is not clear.   Intubated in ED. Remained shock after ROSC, req NE and Epi. Woke and was purposeful in ER.   Per report, he was SOB preceding arrest for several days (?up to 2 wks).  Was seen by his PCP 3/25 with reports of shortness of breath at that time, but unchanged for 10 years, mild intermittent cough with LE edema.   Pertinent  Medical History  COPD Diastolic HF HTN  Etoh Abuse PTSD   Significant Hospital Events: Including procedures, antibiotic start and stop dates in addition to other pertinent events   4/1 admit to ICU following witnessed OOH arrest x2, and subsequent ED arrest   Interim History / Subjective:   Sedated on ventilator. Opening eyes to painful stimuli. Not following commands  Objective   Blood pressure 109/61, pulse 66, temperature 99.3 F (37.4 C), resp. rate (!) 26, weight 127.9 kg, SpO2 99 %. CVP:  [7 mmHg-11 mmHg] 11 mmHg  Vent Mode: PRVC FiO2 (%):  [40 %-60 %] 40 % Set Rate:  [26 bmp] 26 bmp Vt Set:  [550 mL] 550 mL PEEP:  [10 cmH20] 10 cmH20 Plateau Pressure:  [17 cmH20-24 cmH20] 23 cmH20   Intake/Output Summary (Last 24 hours) at 08/12/2022 0807 Last data filed at 08/12/2022 0500 Gross per 24 hour  Intake 2664.53 ml  Output 710 ml  Net 1954.53 ml    Filed Weights   08/11/22 0100 08/12/22 0500  Weight: 124.2 kg 127.9 kg    Examination: Physical Exam Constitutional:      General: He is not in acute distress.    Appearance: He is  ill-appearing.  HENT:     Head: Normocephalic.  Eyes:     General:        Right eye: No discharge.        Left eye: No discharge.     Pupils: Pupils are equal, round, and reactive to light.  Neck:     Comments: Right IJ central line in place. Still bleeding slowly.  Cardiovascular:     Rate and Rhythm: Normal rate and regular rhythm.     Heart sounds: No murmur heard.    Comments: +1 edema of LE bilat Pulmonary:     Effort: No respiratory distress.     Breath sounds: Normal breath sounds.  Abdominal:     General: Bowel sounds are normal. There is no distension.     Palpations: Abdomen is soft.     Tenderness: There is no abdominal tenderness.  Skin:    General: Skin is warm.  Neurological:     Comments: Sedated.  Opening eyes to painful stimuli.     Afebrile Vent: Peep 8, 40% Fio2, 50 Fent, 1 Versed, 20 Levo, Vaso, x Epi CVP 7 O: 710 +6L total   Resolved Hospital Problem list     Assessment & Plan:  Witnessed OOH Cardiac Arrest, PEA Massive pulmonary embolism PE predominantly on the right side with right heart strain  RV/LV = 1.78.  However right heart strain may be overestimated.  Poor echocardiogram imaging but his EF is preserved.  He received TNK and on IV heparin -Continue IV heparin, currently therapeutic -Normothermia goal -Will need hypercoagulable/malignancy workup when more stable consider this is unprovoked PE.   Shock Likely cardiogenic shock from right heart failure due to PE and also sedation related.  Questionable sepsis from pneumonia.  Weaning down pressors as much possible. -Continue stress dose steroid -Continue levo and vasopressor.  If still requiring high pressors despite going down sedation, we may need to start him on dobutamine. -Continue ceftriaxone empirically (day 3).  Nasal MRSA negative -Pending blood culture and sputum culture  Acute respiratory failure with hypoxemia and hypercarbia  COPD (PFT 2019, follows Dr. Melvyn Novas) CAP vs  Aspiration PNA R>L  Remains intubated.  Still has high pressors requirement. -PRVC with LTVV.  SBT if able -He is on Breztri at home. Continue Brovana, Pulmicort and Yuperi. Duobnebs PRN.  -Continue ceftriaxone empirically for CAP  Hx Diastolic HF, NYHA Class 1 Echocardiogram showed preserved EF. -Hold home benicar, demadex, coreg in the setting of shock -Hold off on diuresis today.  CVP is good at 7.   AKI Hyperkalemia Suspect ATN in the setting of shock.  He is showing signs of recovery with downtrending creatinine and increased urine output. -Trend BMP.  No indication for CRRT yet  Elevated LFT's NASH -follow LFT's  -anticipate shock liver post arrest   Pre-Diabetes  -SSI   Hypothyroidism -not on meds  -TSH 4.6 3/25   Best Practice (right click and "Reselect all SmartList Selections" daily)  Diet/type: NPO DVT prophylaxis: heparin Gtt GI prophylaxis: PPI Lines: Central line Foley:  Yes, and it is still needed Code Status:  DNR Last date of multidisciplinary goals of care discussion: DNR. Updated spouse at bedside 4/3 Labs   CBC: Recent Labs  Lab 08/10/22 0615 08/10/22 0622 08/10/22 1401 08/10/22 1506 08/11/22 0445 08/11/22 1315 08/11/22 1345 08/12/22 0350  WBC 18.0*  --  14.2*  --  14.6* 13.4*  --  10.7*  HGB 16.0   < > 16.3 16.3 14.3 13.2 13.3 12.0*  HCT 49.0   < > 46.8 48.0 40.8 37.4* 39.0 32.8*  MCV 93.9  --  87.5  --  86.3 85.6  --  84.5  PLT 178  --  127*  --  126* 115*  --  122*   < > = values in this interval not displayed.     Basic Metabolic Panel: Recent Labs  Lab 08/10/22 0615 08/10/22 0622 08/10/22 1401 08/10/22 1506 08/11/22 0445 08/11/22 1045 08/11/22 1315 08/11/22 1345 08/12/22 0350  NA 138   < > 138 138 138  --  136 134* 135  K 6.0*   < > 4.6 4.9 5.6* 4.9 5.1 5.0 4.7  CL 96*  --  95*  --  98  --  98  --  98  CO2 26  --  30  --  29  --  28  --  24  GLUCOSE 190*  --  122*  --  137*  --  135*  --  168*  BUN 22  --  25*  --  31*   --  34*  --  38*  CREATININE 1.89*  --  2.19*  --  2.80*  --  3.12*  --  2.85*  CALCIUM 8.5*  --  7.8*  --  7.4*  --  7.3*  --  7.3*  MG 2.1  --   --   --   --   --   --   --   --   PHOS 6.6*  --   --   --   --   --   --   --   --    < > = values in this interval not displayed.    GFR: Estimated Creatinine Clearance: 29.6 mL/min (A) (by C-G formula based on SCr of 2.85 mg/dL (H)). Recent Labs  Lab 08/10/22 0615 08/10/22 0642 08/10/22 1401 08/10/22 1402 08/11/22 0445 08/11/22 1315 08/12/22 0350  PROCALCITON <0.10  --   --   --   --   --   --   WBC 18.0*  --  14.2*  --  14.6* 13.4* 10.7*  LATICACIDVEN 5.5* 7.7*  --  2.2*  --   --   --      Liver Function Tests: Recent Labs  Lab 08/10/22 0615 08/11/22 0445  AST 94* 50*  ALT 46* 33  ALKPHOS 80 52  BILITOT 1.2 2.2*  PROT 5.6* 4.9*  ALBUMIN 2.6* 2.3*    No results for input(s): "LIPASE", "AMYLASE" in the last 168 hours. No results for input(s): "AMMONIA" in the last 168 hours.  ABG    Component Value Date/Time   PHART 7.440 08/11/2022 1345   PCO2ART 41.8 08/11/2022 1345   PO2ART 143 (H) 08/11/2022 1345   HCO3 28.0 08/11/2022 1345   TCO2 29 08/11/2022 1345   ACIDBASEDEF 3.0 (H) 08/10/2022 0622   O2SAT 99 08/11/2022 1345     Coagulation Profile: Recent Labs  Lab 08/10/22 0615 08/10/22 1401  INR 1.4* 1.6*     Cardiac Enzymes: No results for input(s): "CKTOTAL", "CKMB", "CKMBINDEX", "TROPONINI" in the last 168 hours.  HbA1C: Hgb A1c MFr Bld  Date/Time Value Ref Range Status  08/03/2022 12:13 PM 6.4 4.6 - 6.5 % Final    Comment:    Glycemic Control Guidelines for People with Diabetes:Non Diabetic:  <6%Goal of Therapy: <7%Additional Action Suggested:  >8%   02/12/2022 02:31 PM 6.2 4.6 - 6.5 % Final    Comment:    Glycemic Control Guidelines for People with Diabetes:Non Diabetic:  <6%Goal of Therapy: <7%Additional Action Suggested:  >8%     CBG: Recent Labs  Lab 08/11/22 1536 08/11/22 1908  08/11/22 2332 08/12/22 0332 08/12/22 0715  GLUCAP 88 117* 152* 174* 182*     Review of Systems:   Unable to complete as patient is on mechanical ventilation.   Past Medical History:  He,  has a past medical history of ANXIETY (02/16/2007), DEPRESSION (02/16/2007), Diverticulosis, ERECTILE DYSFUNCTION (02/16/2007), FATTY LIVER DISEASE (06/07/2008), HYPERGLYCEMIA (02/16/2007), Hyperlipidemia, Internal hemorrhoid, NASH (nonalcoholic steatohepatitis), PULMONARY EMBOLISM, HX OF (02/16/2007), SUBACROMIAL BURSITIS, RIGHT (06/07/2008), Tubular adenoma of colon (07/2001), and VENOUS INSUFFICIENCY (02/16/2007).   Surgical History:   Past Surgical History:  Procedure Laterality Date   COLONOSCOPY     Drainage fluid from chest  1990   Leg stripping  2003   Left      Social History:   reports that he quit smoking about 9 years ago. His smoking use included cigarettes. He has a 25.00 pack-year smoking history. He has never used smokeless tobacco. He reports current alcohol use of about 14.0 standard drinks of alcohol per week. He reports that he does not use drugs.   Family History:  His family history includes Cancer in his mother and sister. There is no history of Colon cancer, Esophageal cancer,  Rectal cancer, Stomach cancer, Prostate cancer, or Pancreatic cancer.   Allergies Allergies  Allergen Reactions   Latex Hives and Rash     Home Medications  Prior to Admission medications   Medication Sig Start Date End Date Taking? Authorizing Provider  acetaminophen (TYLENOL) 650 MG CR tablet Take 650 mg by mouth every 8 (eight) hours as needed.    [provider]  albuterol (VENTOLIN HFA) 108 (90 Base) MCG/ACT inhaler TAKE 2 PUFFS BY MOUTH EVERY 6 HOURS AS NEEDED FOR WHEEZE OR SHORTNESS OF BREATH 01/21/22   Janith Lima, MD  atorvastatin (LIPITOR) 80 MG tablet TAKE 1 TABLET BY MOUTH EVERY DAY 04/25/22   Janith Lima, MD  Budeson-Glycopyrrol-Formoterol 160-9-4.8 MCG/ACT AERO Inhale 2  puffs into the lungs in the morning and at bedtime. 01/28/22   Janith Lima, MD  carvedilol (COREG) 12.5 MG tablet TAKE 1 TABLET (12.5MG  TOTAL) BY MOUTH TWICE A DAY WITH MEALS 08/03/22   Janith Lima, MD  carvedilol (COREG) 12.5 MG tablet Take 1 tablet (12.5 mg total) by mouth 2 (two) times daily with a meal. 08/03/22   Janith Lima, MD  Cholecalciferol 50 MCG (2000 UT) TABS Take 1 tablet (2,000 Units total) by mouth daily. 10/17/19   Janith Lima, MD  folic acid (FOLVITE) 1 MG tablet TAKE 1 TABLET BY MOUTH EVERY DAY 07/19/22   Janith Lima, MD  KLOR-CON M20 20 MEQ tablet TAKE 1 TABLET BY MOUTH TWICE A DAY 02/15/22   Janith Lima, MD  levalbuterol [facility] HFA) 45 MCG/ACT inhaler Inhale 1 puff into the lungs every 6 (six) hours as needed for wheezing. 02/15/21   Janith Lima, MD  levocetirizine (XYZAL) 5 MG tablet TAKE 1 TABLET BY MOUTH EVERY DAY IN THE EVENING 02/15/22   Janith Lima, MD  Menthol, Topical Analgesic, (ICY HOT PAIN RELIEVING EX) Apply topically daily as needed.    [provider]  Multiple Vitamin (MULTIVITAMIN) tablet Take 1 tablet by mouth daily. Centrum silver - mens 50+    [provider]  olmesartan (BENICAR) 40 MG tablet TAKE 1 TABLET BY MOUTH EVERY DAY 04/25/22   Horald Pollen, MD  torsemide (DEMADEX) 20 MG tablet TAKE 1 TABLET BY MOUTH TWICE A DAY 04/25/22   Janith Lima, MD     Critical care time:    Gaylan Gerold, DO Internal Medicine Residency My pager: [phone]

## 2022-08-12 NOTE — Progress Notes (Signed)
eLink Physician-Brief Progress Note Patient Name: Kurt Blair DOB: 1946-04-15 MRN: [id number]   Date of Service  08/12/2022  HPI/Events of Note  Bleeding from central line site. Patient is on a heparin IV infusion for massive PE. Nursing request for Thrombi-pad.  eICU Interventions  Plan: Thrombi-pad to bleeding central line site.      Intervention Category Major Interventions: Other:  Heran Campau Cornelia Copa 08/12/2022, 4:20 AM

## 2022-08-12 NOTE — Progress Notes (Signed)
Initial Nutrition Assessment  DOCUMENTATION CODES:   Obesity unspecified  INTERVENTION:  Initiate trickle tube feeding via cortrak tube at 59mL/h. Goal is the following:  Vital 1.5 at 60 ml/h (1440 ml per day) Prosource TF20 60 ml BID Provides 2320 kcal, 137 gm protein, 1100 ml free water daily  NUTRITION DIAGNOSIS:   Inadequate oral intake related to inability to eat as evidenced by NPO status.  GOAL:   Patient will meet greater than or equal to 90% of their needs  MONITOR:   TF tolerance, Vent status, Labs, I & O's, Weight trends  REASON FOR ASSESSMENT:   Consult Assessment of nutrition requirement/status  ASSESSMENT:   Pt with hx of HLD, HTN, COPD, CHF, and NASH presented to ED s/p cardiac arrest. Received several rounds of ACLS.  4/1 - intubated  4/3 - cortrak tube placed (gastric)  Patient is currently intubated on ventilator support. No family in room at the time of assessment to provide a nutrition hx. Pt has significant edema present, particularly to the BLE.   Pressor needs improving. Discussed in rounds. Cortrak placed today and ok for trickle feeds per MD.  MV: 13.6 L/min Temp (24hrs), Avg:99.9 F (37.7 C), Min:98.8 F (37.1 C), Max:101.1 F (38.4 C)   Intake/Output Summary (Last 24 hours) at 08/12/2022 1245 Last data filed at 08/12/2022 1200 Gross per 24 hour  Intake 3283.54 ml  Output 785 ml  Net 2498.54 ml  Net IO Since Admission: 7,326.25 mL [08/12/22 1245]  Nutritionally Relevant Medications: Scheduled Meds:  docusate  100 mg Per Tube BID   hydrocortisone sod succinate (SOLU-CORTEF) inj  100 mg Intravenous Q8H   insulin aspart  0-15 Units Subcutaneous Q4H   pantoprazole IV  40 mg Intravenous QHS   polyethylene glycol  17 g Per Tube BID   senna  1 tablet Per Tube BID   Continuous Infusions:  cefTRIAXone (ROCEPHIN)  IV Stopped (08/11/22 1041)   midazolam 2 mg/hr (08/12/22 0757)   norepinephrine (LEVOPHED) Adult infusion 29 mcg/min  (08/12/22 0800)   vasopressin 0.03 Units/min (08/12/22 0800)   PRN Meds: ondansetron  Labs Reviewed: BUN 38, creatinine 2.85 CBG ranges from 88-182 mg/dL over the last 24 hours HGbA1c 6.4% 3/25  NUTRITION - FOCUSED PHYSICAL EXAM: Flowsheet Row Most Recent Value  Orbital Region No depletion  Upper Arm Region No depletion  Thoracic and Lumbar Region No depletion  Buccal Region No depletion  Temple Region Mild depletion  Clavicle Bone Region No depletion  Clavicle and Acromion Bone Region Mild depletion  Scapular Bone Region No depletion  Dorsal Hand Unable to assess  [mittens]  Patellar Region No depletion  Anterior Thigh Region No depletion  Posterior Calf Region No depletion  Edema (RD Assessment) Moderate  [generalized, BLE]  Hair Reviewed  Eyes Reviewed  Mouth Reviewed  Skin Reviewed  Nails Reviewed    Diet Order:   Diet Order             Diet NPO time specified  Diet effective now                   EDUCATION NEEDS:   Not appropriate for education at this time  Skin:  Skin Assessment: Reviewed RN Assessment  Last BM:  4/2  Height:   Ht Readings from Last 1 Encounters:  08/03/22 5\' 10"  (1.778 m)    Weight:   Wt Readings from Last 1 Encounters:  08/12/22 127.9 kg    Ideal Body Weight:  75.5 kg  BMI:  Body mass index is 40.46 kg/m.  Estimated Nutritional Needs:   Kcal:  2200-2500 kcal/d  Protein:  120-150g/d  Fluid:  2L/d    Ranell Patrick, RD, LDN Clinical Dietitian RD pager # available in [place]  After hours/weekend pager # available in [facility]

## 2022-08-13 ENCOUNTER — Inpatient Hospital Stay (HOSPITAL_COMMUNITY): Payer: Medicare Other

## 2022-08-13 ENCOUNTER — Encounter (HOSPITAL_COMMUNITY): Payer: Self-pay | Admitting: Pulmonary Disease

## 2022-08-13 DIAGNOSIS — M7989 Other specified soft tissue disorders: Secondary | ICD-10-CM

## 2022-08-13 DIAGNOSIS — I469 Cardiac arrest, cause unspecified: Secondary | ICD-10-CM | POA: Diagnosis not present

## 2022-08-13 LAB — CBC
HCT: 24.8 % — ABNORMAL LOW (ref 39.0–52.0)
HCT: 24.8 % — ABNORMAL LOW (ref 39.0–52.0)
Hemoglobin: 9.3 g/dL — ABNORMAL LOW (ref 13.0–17.0)
Hemoglobin: 9 g/dL — ABNORMAL LOW (ref 13.0–17.0)
MCH: 31.2 pg (ref 26.0–34.0)
MCH: 30.6 pg (ref 26.0–34.0)
MCHC: 37.5 g/dL — ABNORMAL HIGH (ref 30.0–36.0)
MCHC: 36.3 g/dL — ABNORMAL HIGH (ref 30.0–36.0)
MCV: 83.2 fL (ref 80.0–100.0)
MCV: 84.4 fL (ref 80.0–100.0)
Platelets: 115 10*3/uL — ABNORMAL LOW (ref 150–400)
Platelets: 110 10*3/uL — ABNORMAL LOW (ref 150–400)
RBC: 2.98 MIL/uL — ABNORMAL LOW (ref 4.22–5.81)
RBC: 2.94 MIL/uL — ABNORMAL LOW (ref 4.22–5.81)
RDW: 14.1 % (ref 11.5–15.5)
RDW: 14 % (ref 11.5–15.5)
WBC: 9.5 10*3/uL (ref 4.0–10.5)
WBC: 8.5 10*3/uL (ref 4.0–10.5)
nRBC: 0.2 % (ref 0.0–0.2)
nRBC: 0 % (ref 0.0–0.2)

## 2022-08-13 LAB — COOXEMETRY PANEL
Carboxyhemoglobin: 1 % (ref 0.5–1.5)
Methemoglobin: 1.3 % (ref 0.0–1.5)
O2 Saturation: 58.1 %
Total hemoglobin: 9.2 g/dL — ABNORMAL LOW (ref 12.0–16.0)

## 2022-08-13 LAB — BASIC METABOLIC PANEL
Anion gap: 14 (ref 5–15)
BUN: 48 mg/dL — ABNORMAL HIGH (ref 8–23)
CO2: 24 mmol/L (ref 22–32)
Calcium: 7.4 mg/dL — ABNORMAL LOW (ref 8.9–10.3)
Chloride: 98 mmol/L (ref 98–111)
Creatinine, Ser: 2.6 mg/dL — ABNORMAL HIGH (ref 0.61–1.24)
GFR, Estimated: 25 mL/min — ABNORMAL LOW (ref >60)
Glucose, Bld: 130 mg/dL — ABNORMAL HIGH (ref 70–99)
Potassium: 4 mmol/L (ref 3.5–5.1)
Sodium: 136 mmol/L (ref 135–145)

## 2022-08-13 LAB — GLUCOSE, CAPILLARY
Glucose-Capillary: 136 mg/dL — ABNORMAL HIGH (ref 70–99)
Glucose-Capillary: 158 mg/dL — ABNORMAL HIGH (ref 70–99)
Glucose-Capillary: 145 mg/dL — ABNORMAL HIGH (ref 70–99)
Glucose-Capillary: 105 mg/dL — ABNORMAL HIGH (ref 70–99)
Glucose-Capillary: 103 mg/dL — ABNORMAL HIGH (ref 70–99)
Glucose-Capillary: 108 mg/dL — ABNORMAL HIGH (ref 70–99)

## 2022-08-13 LAB — PHOSPHORUS: Phosphorus: 3.2 mg/dL (ref 2.5–4.6)

## 2022-08-13 LAB — HEPARIN LEVEL (UNFRACTIONATED): Heparin Unfractionated: 0.38 IU/mL (ref 0.30–0.70)

## 2022-08-13 LAB — MAGNESIUM: Magnesium: 1.5 mg/dL — ABNORMAL LOW (ref 1.7–2.4)

## 2022-08-13 MED ORDER — TRANEXAMIC ACID 1000 MG/10ML IV SOLN: 1000.0000 mg | Freq: Once | INTRAVENOUS | Status: DC

## 2022-08-13 MED ORDER — FUROSEMIDE 10 MG/ML IJ SOLN: 40.0000 mg | Freq: Once | INTRAMUSCULAR | Status: DC

## 2022-08-13 MED ORDER — MAGNESIUM SULFATE 2 GM/50ML IV SOLN
2.0000 g | Freq: Once | INTRAVENOUS | Status: AC
  Administered 2022-08-13: 2 g via INTRAVENOUS
  Filled 2022-08-13: qty 50

## 2022-08-13 MED ORDER — TRANEXAMIC ACID FOR EPISTAXIS
1000.0000 mg | Freq: Once | TOPICAL | Status: AC
  Administered 2022-08-13: 1000 mg via TOPICAL
  Filled 2022-08-13 (×2): qty 10

## 2022-08-13 MED ORDER — NAPHAZOLINE-PHENIRAMINE 0.025-0.3 % OP SOLN: 1.0000 [drp] | Freq: Four times a day (QID) | OPHTHALMIC | Status: DC | PRN

## 2022-08-13 NOTE — Progress Notes (Signed)
NAME:  Kurt Blair, MRN:  [id number], DOB:  1946/01/16, LOS: 3 ADMISSION DATE:  08/10/2022, CONSULTATION DATE:  08/10/22 REFERRING MD:  Leonette Monarch - EM, CHIEF COMPLAINT:  cardiac arrest    History of Present Illness:  77 y/o M PMH diastolic HF, COPD, HTN, PTSD, etoh abuse presented to ED following witnessed arrest at home while walking to bathroom. With EMS -- CPR [phone], [phone]. It sounds like pulses were lost a third time just prior to arrival and pt required several rounds of ACLS, though duration is not clear.   Intubated in ED. Remained shock after ROSC, req NE and Epi. Woke and was purposeful in ER.   Per report, he was SOB preceding arrest for several days (?up to 2 wks).  Was seen by his PCP 3/25 with reports of shortness of breath at that time, but unchanged for 10 years, mild intermittent cough with LE edema.   Pertinent  Medical History  COPD Diastolic HF HTN  Etoh Abuse PTSD   Significant Hospital Events: Including procedures, antibiotic start and stop dates in addition to other pertinent events   4/1 admit to ICU following witnessed OOH arrest x2, and subsequent ED arrest   Interim History / Subjective:   Opening eyes to verbal stimulation and follow simple commands.   Objective   Blood pressure 109/61, pulse 61, temperature 98.4 F (36.9 C), resp. rate (!) 26, weight 128.7 kg, SpO2 97 %. CVP:  [13 mmHg] 13 mmHg  Vent Mode: PRVC FiO2 (%):  [40 %-50 %] 50 % Set Rate:  [26 bmp] 26 bmp Vt Set:  [550 mL] 550 mL PEEP:  [8 cmH20] 8 cmH20 Pressure Support:  [10 cmH20] 10 cmH20 Plateau Pressure:  [20 cmH20-21 cmH20] 20 cmH20   Intake/Output Summary (Last 24 hours) at 08/13/2022 0921 Last data filed at 08/13/2022 0700 Gross per 24 hour  Intake 1944.82 ml  Output 720 ml  Net 1224.82 ml    Filed Weights   08/11/22 0100 08/12/22 0500 08/13/22 0445  Weight: 124.2 kg 127.9 kg 128.7 kg    Examination: Physical Exam Constitutional:      General: He is not in acute  distress.    Appearance: He is ill-appearing.  HENT:     Head: Normocephalic.  Eyes:     General:        Right eye: No discharge.        Left eye: No discharge.     Pupils: Pupils are equal, round, and reactive to light.  Neck:     Comments: Right IJ central line in place.  No active bleeding. No hematoma palpated at right neck.  Cardiovascular:     Rate and Rhythm: Normal rate and regular rhythm.     Heart sounds: No murmur heard.    Comments: +1 edema of LE bilat Pulmonary:     Effort: No respiratory distress.     Breath sounds: Normal breath sounds.  Abdominal:     General: Bowel sounds are normal. There is no distension.     Palpations: Abdomen is soft.     Tenderness: There is no abdominal tenderness.  Skin:    General: Skin is warm.  Neurological:     Comments: Opening eyes to verbal stimulation and follow commands     Resolved Hospital Problem list     Assessment & Plan:  Witnessed OOH Cardiac Arrest, PEA Massive pulmonary embolism PE predominantly on the right side with right heart strain RV/LV = 1.78.  However right  heart strain may be overestimated.  Poor echocardiogram imaging but his EF is preserved.  He received TNK and on IV heparin -Continue IV heparin, currently therapeutic -Will need hypercoagulable/malignancy workup when more stable consider this is unprovoked PE.   Acute normocytic anemia Hgb 14.3 - 13.3 - 12 - 9.3 No obvious sign of bleeding. Obtain CT abd/pel to evaluate for possible retroperitoneal bleed.   Shock Likely cardiogenic shock from right heart failure due to PE and also sedation related.  Questionable sepsis from pneumonia.  He is on less pressor today.  -Continue stress dose steroid -Continue levo and vasopressor.  -Continue ceftriaxone empirically (day 4).  Nasal MRSA negative -Pending blood culture and sputum culture  Acute respiratory failure with hypoxemia and hypercarbia  COPD (PFT 2019, follows Dr. Melvyn Novas) CAP vs Aspiration PNA  R>L  Remains intubated.  Still has high pressors requirement. -PRVC with LTVV.  SBT if able -He is on Breztri at home. Continue Brovana, Pulmicort and Yuperi. Duobnebs PRN.  -Continue ceftriaxone empirically for CAP  Hx Diastolic HF, NYHA Class 1 Echocardiogram showed preserved EF. -Hold home benicar, demadex, coreg in the setting of shock -CVP is 13 today. Hold off on Lasix until ruling out internal hemorrhage   AKI Hyperkalemia Suspect ATN in the setting of shock.  He is showing signs of recovery with downtrending creatinine and increased urine output. -Trend BMP.  No indication for CRRT yet  Elevated LFT's NASH -follow LFT's  -anticipate shock liver post arrest   Pre-Diabetes  -SSI   Hypothyroidism -not on meds  -TSH 4.6 3/25   Best Practice (right click and "Reselect all SmartList Selections" daily)  Diet/type: NPO DVT prophylaxis: heparin Gtt GI prophylaxis: PPI Lines: Central line Foley:  Yes, and it is still needed Code Status:  DNR Last date of multidisciplinary goals of care discussion: DNR. Updated spouse at bedside 4/3 Labs   CBC: Recent Labs  Lab 08/10/22 1401 08/10/22 1506 08/11/22 0445 08/11/22 1315 08/11/22 1345 08/12/22 0350 08/13/22 0415  WBC 14.2*  --  14.6* 13.4*  --  10.7* 9.5  HGB 16.3   < > 14.3 13.2 13.3 12.0* 9.3*  HCT 46.8   < > 40.8 37.4* 39.0 32.8* 24.8*  MCV 87.5  --  86.3 85.6  --  84.5 83.2  PLT 127*  --  126* 115*  --  122* 115*   < > = values in this interval not displayed.     Basic Metabolic Panel: Recent Labs  Lab 08/10/22 0615 08/10/22 0622 08/10/22 1401 08/10/22 1506 08/11/22 0445 08/11/22 1045 08/11/22 1315 08/11/22 1345 08/12/22 0350 08/13/22 0415  NA 138   < > 138   < > 138  --  136 134* 135 136  K 6.0*   < > 4.6   < > 5.6* 4.9 5.1 5.0 4.7 4.0  CL 96*  --  95*  --  98  --  98  --  98 98  CO2 26  --  30  --  29  --  28  --  24 24  GLUCOSE 190*  --  122*  --  137*  --  135*  --  168* 130*  BUN 22  --  25*   --  31*  --  34*  --  38* 48*  CREATININE 1.89*  --  2.19*  --  2.80*  --  3.12*  --  2.85* 2.60*  CALCIUM 8.5*  --  7.8*  --  7.4*  --  7.3*  --  7.3* 7.4*  MG 2.1  --   --   --   --   --   --   --   --  1.5*  PHOS 6.6*  --   --   --   --   --   --   --   --  3.2   < > = values in this interval not displayed.    GFR: Estimated Creatinine Clearance: 32.6 mL/min (A) (by C-G formula based on SCr of 2.6 mg/dL (H)). Recent Labs  Lab 08/10/22 0615 08/10/22 0642 08/10/22 1401 08/10/22 1402 08/11/22 0445 08/11/22 1315 08/12/22 0350 08/13/22 0415  PROCALCITON <0.10  --   --   --   --   --   --   --   WBC 18.0*  --    < >  --  14.6* 13.4* 10.7* 9.5  LATICACIDVEN 5.5* 7.7*  --  2.2*  --   --   --   --    < > = values in this interval not displayed.     Liver Function Tests: Recent Labs  Lab 08/10/22 0615 08/11/22 0445  AST 94* 50*  ALT 46* 33  ALKPHOS 80 52  BILITOT 1.2 2.2*  PROT 5.6* 4.9*  ALBUMIN 2.6* 2.3*    No results for input(s): "LIPASE", "AMYLASE" in the last 168 hours. No results for input(s): "AMMONIA" in the last 168 hours.  ABG    Component Value Date/Time   PHART 7.440 08/11/2022 1345   PCO2ART 41.8 08/11/2022 1345   PO2ART 143 (H) 08/11/2022 1345   HCO3 28.0 08/11/2022 1345   TCO2 29 08/11/2022 1345   ACIDBASEDEF 3.0 (H) 08/10/2022 0622   O2SAT 58.1 08/13/2022 0415     Coagulation Profile: Recent Labs  Lab 08/10/22 0615 08/10/22 1401  INR 1.4* 1.6*     Cardiac Enzymes: No results for input(s): "CKTOTAL", "CKMB", "CKMBINDEX", "TROPONINI" in the last 168 hours.  HbA1C: Hgb A1c MFr Bld  Date/Time Value Ref Range Status  08/03/2022 12:13 PM 6.4 4.6 - 6.5 % Final    Comment:    Glycemic Control Guidelines for People with Diabetes:Non Diabetic:  <6%Goal of Therapy: <7%Additional Action Suggested:  >8%   02/12/2022 02:31 PM 6.2 4.6 - 6.5 % Final    Comment:    Glycemic Control Guidelines for People with Diabetes:Non Diabetic:  <6%Goal of  Therapy: <7%Additional Action Suggested:  >8%     CBG: Recent Labs  Lab 08/12/22 1525 08/12/22 1902 08/12/22 2303 08/13/22 0302 08/13/22 0730  GLUCAP 139* 128* 124* 136* 158*     Review of Systems:   Unable to complete as patient is on mechanical ventilation.   Past Medical History:  He,  has a past medical history of ANXIETY (02/16/2007), DEPRESSION (02/16/2007), Diverticulosis, ERECTILE DYSFUNCTION (02/16/2007), FATTY LIVER DISEASE (06/07/2008), HYPERGLYCEMIA (02/16/2007), Hyperlipidemia, Internal hemorrhoid, NASH (nonalcoholic steatohepatitis), PULMONARY EMBOLISM, HX OF (02/16/2007), SUBACROMIAL BURSITIS, RIGHT (06/07/2008), Tubular adenoma of colon (07/2001), and VENOUS INSUFFICIENCY (02/16/2007).   Surgical History:   Past Surgical History:  Procedure Laterality Date   COLONOSCOPY     Drainage fluid from chest  1990   Leg stripping  2003   Left      Social History:   reports that he quit smoking about 9 years ago. His smoking use included cigarettes. He has a 25.00 pack-year smoking history. He has never used smokeless tobacco. He reports current alcohol use of about 14.0 standard drinks of alcohol per week.  He reports that he does not use drugs.   Family History:  His family history includes Cancer in his mother and sister. There is no history of Colon cancer, Esophageal cancer, Rectal cancer, Stomach cancer, Prostate cancer, or Pancreatic cancer.   Allergies Allergies  Allergen Reactions   Latex Hives and Rash     Home Medications  Prior to Admission medications   Medication Sig Start Date End Date Taking? Authorizing Provider  acetaminophen (TYLENOL) 650 MG CR tablet Take 650 mg by mouth every 8 (eight) hours as needed.    [provider]  albuterol (VENTOLIN HFA) 108 (90 Base) MCG/ACT inhaler TAKE 2 PUFFS BY MOUTH EVERY 6 HOURS AS NEEDED FOR WHEEZE OR SHORTNESS OF BREATH 01/21/22   Janith Lima, MD  atorvastatin (LIPITOR) 80 MG tablet TAKE 1 TABLET BY MOUTH  EVERY DAY 04/25/22   Janith Lima, MD  Budeson-Glycopyrrol-Formoterol 160-9-4.8 MCG/ACT AERO Inhale 2 puffs into the lungs in the morning and at bedtime. 01/28/22   Janith Lima, MD  carvedilol (COREG) 12.5 MG tablet TAKE 1 TABLET (12.5MG  TOTAL) BY MOUTH TWICE A DAY WITH MEALS 08/03/22   Janith Lima, MD  carvedilol (COREG) 12.5 MG tablet Take 1 tablet (12.5 mg total) by mouth 2 (two) times daily with a meal. 08/03/22   Janith Lima, MD  Cholecalciferol 50 MCG (2000 UT) TABS Take 1 tablet (2,000 Units total) by mouth daily. 10/17/19   Janith Lima, MD  folic acid (FOLVITE) 1 MG tablet TAKE 1 TABLET BY MOUTH EVERY DAY 07/19/22   Janith Lima, MD  KLOR-CON M20 20 MEQ tablet TAKE 1 TABLET BY MOUTH TWICE A DAY 02/15/22   Janith Lima, MD  levalbuterol [facility] HFA) 45 MCG/ACT inhaler Inhale 1 puff into the lungs every 6 (six) hours as needed for wheezing. 02/15/21   Janith Lima, MD  levocetirizine (XYZAL) 5 MG tablet TAKE 1 TABLET BY MOUTH EVERY DAY IN THE EVENING 02/15/22   Janith Lima, MD  Menthol, Topical Analgesic, (ICY HOT PAIN RELIEVING EX) Apply topically daily as needed.    [provider]  Multiple Vitamin (MULTIVITAMIN) tablet Take 1 tablet by mouth daily. Centrum silver - mens 50+    [provider]  olmesartan (BENICAR) 40 MG tablet TAKE 1 TABLET BY MOUTH EVERY DAY 04/25/22   Horald Pollen, MD  torsemide (DEMADEX) 20 MG tablet TAKE 1 TABLET BY MOUTH TWICE A DAY 04/25/22   Janith Lima, MD     Critical care time:    Gaylan Gerold, DO Internal Medicine Residency My pager: [phone]

## 2022-08-13 NOTE — Progress Notes (Signed)
Pt transported from 2M04 to CT and back without any complications. RN at bedside.

## 2022-08-13 NOTE — Progress Notes (Addendum)
CT chest/abdomen/pelvis showed 3 places of bleeding: Small volume hemoperitoneum in the pelvis unclear origin, left flank subcutaneous hematoma and a small volume of posterior mediastinal hematoma.  The small volume posterior mediastinal hematoma is likely from the bleeding right IJ central line and compression.  The site is still oozing slowly but thrombin pad is on backorder.  Will use TXA gauze instead.  Left flank visualized showed area of hematoma could be from trauma.    Per radiology, there is no role for IR to embolize this bleeding without an obvious source.  Will recheck CBC this afternoon.  If hemoglobin drops, we will obtain CT abdomen/pelvis with contrast.  -Stop heparin drip for now. -Obtain bilateral LE Doppler to rule out DVT.  If positive, he may need a IVC filter  Addendum DVT doppler showed age indeterminate DVT of left femoral, popliteal and gastrocnemius vein. Spoke with Dr. Kathlene Cote will evaluate for IVC filter

## 2022-08-13 NOTE — Consult Note (Signed)
Chief Complaint: Patient was seen in consultation today for DVT, PE  Referring Physician(s): Dr. Silas Flood  Supervising Physician: Aletta Edouard  Patient Status: [facility] - In-pt  History of Present Illness: Kurt Blair is a 77 y.o. male with history of CHF admitted with cardiopulmonary arrest with resuscitation.  CTA Chest 08/10/22 showed: 1. Positive for pulmonary emboli, predominantly right-sided as detailed above. Positive for acute PE with CT evidence of right heart strain (RV/LV Ratio = 1.78) consistent with at least submassive (intermediate risk) PE. The presence of right heart strain has been associated with an increased risk of morbidity and mortality. Please refer to the "Code PE Focused" order set in EPIC.  He was also found to have left lower extremity DVT of the left femoral vein, left popliteal vein, and left gastrocnemius vein by LE dopplers.  Since initiating anticoagulation he has developed several sites of subcutaneous/soft tissue hematomas as well as mediastinal hematoma from bleeding around his IJ central line and a small hemoperitoneum.  IR now consulted for IVC filter.    Past Medical History:  Diagnosis Date   ANXIETY 02/16/2007   DEPRESSION 02/16/2007   Diverticulosis    ERECTILE DYSFUNCTION 02/16/2007   FATTY LIVER DISEASE 06/07/2008   HYPERGLYCEMIA 02/16/2007   Hyperlipidemia    Internal hemorrhoid    NASH (nonalcoholic steatohepatitis)    PULMONARY EMBOLISM, HX OF 02/16/2007   SUBACROMIAL BURSITIS, RIGHT 06/07/2008   Tubular adenoma of colon 07/2001   VENOUS INSUFFICIENCY 02/16/2007    Past Surgical History:  Procedure Laterality Date   COLONOSCOPY     Drainage fluid from chest  1990   Leg stripping  2003   Left     Allergies: Latex  Medications: Prior to Admission medications   Medication Sig Start Date End Date Taking? Authorizing Provider  acetaminophen (TYLENOL) 650 MG CR tablet Take 650 mg by mouth every 8 (eight) hours as needed for  pain.   Yes [provider]  albuterol (VENTOLIN HFA) 108 (90 Base) MCG/ACT inhaler TAKE 2 PUFFS BY MOUTH EVERY 6 HOURS AS NEEDED FOR WHEEZE OR SHORTNESS OF BREATH Patient taking differently: Inhale 2 puffs into the lungs every 6 (six) hours as needed for wheezing or shortness of breath. 01/21/22  Yes Janith Lima, MD  atorvastatin (LIPITOR) 80 MG tablet TAKE 1 TABLET BY MOUTH EVERY DAY Patient taking differently: Take 80 mg by mouth daily. 04/25/22  Yes Janith Lima, MD  Budeson-Glycopyrrol-Formoterol 160-9-4.8 MCG/ACT AERO Inhale 2 puffs into the lungs in the morning and at bedtime. 01/28/22  Yes Janith Lima, MD  carvedilol (COREG) 12.5 MG tablet TAKE 1 TABLET (12.5MG  TOTAL) BY MOUTH TWICE A DAY WITH MEALS Patient taking differently: Take 12.5 mg by mouth 2 (two) times daily with a meal. 08/03/22  Yes Janith Lima, MD  diclofenac Sodium (VOLTAREN) 1 % GEL Apply 1 Application topically 2 (two) times daily as needed (pain).   Yes [provider]  folic acid (FOLVITE) 1 MG tablet TAKE 1 TABLET BY MOUTH EVERY DAY Patient taking differently: Take 1 mg by mouth daily. 07/19/22  Yes Janith Lima, MD  KLOR-CON M20 20 MEQ tablet TAKE 1 TABLET BY MOUTH TWICE A DAY Patient taking differently: Take 20 mEq by mouth 2 (two) times daily. 02/15/22  Yes Janith Lima, MD  levocetirizine (XYZAL) 5 MG tablet TAKE 1 TABLET BY MOUTH EVERY DAY IN THE EVENING Patient taking differently: Take 5 mg by mouth every evening. 02/15/22  Yes Ronnald Ramp,  Arvid Right, MD  olmesartan (BENICAR) 40 MG tablet TAKE 1 TABLET BY MOUTH EVERY DAY 04/25/22  Yes Horald Pollen, MD  torsemide (DEMADEX) 20 MG tablet TAKE 1 TABLET BY MOUTH TWICE A DAY 04/25/22  Yes Janith Lima, MD  Cholecalciferol 50 MCG (2000 UT) TABS Take 1 tablet (2,000 Units total) by mouth daily. Patient not taking: Reported on 08/10/2022 10/17/19   Janith Lima, MD     Family History  Problem Relation Age of Onset   Cancer Mother         uncertain type   Cancer Sister        Lung Disease   Colon cancer Neg Hx    Esophageal cancer Neg Hx    Rectal cancer Neg Hx    Stomach cancer Neg Hx    Prostate cancer Neg Hx    Pancreatic cancer Neg Hx     Social History   Socioeconomic History   Marital status: Married    Spouse name: Not on file   Number of children: Not on file   Years of education: Not on file   Highest education level: Not on file  Occupational History   Occupation: Landscaping    Comment: retired  Tobacco Use   Smoking status: Former    Packs/day: 0.50    Years: 50.00    Additional pack years: 0.00    Total pack years: 25.00    Types: Cigarettes    Quit date: 05/11/2013    Years since quitting: 9.2   Smokeless tobacco: Never  Vaping Use   Vaping Use: Never used  Substance and Sexual Activity   Alcohol use: Yes    Alcohol/week: 14.0 standard drinks of alcohol    Types: 14 Shots of liquor per week    Comment: every night 6oz brandy   Drug use: No   Sexual activity: Not Currently    Partners: Female  Other Topics Concern   Not on file  Social History Narrative   [name] (who pt and wife raised) died of cancer 09-07-09   Social Determinants of Health   Financial Resource Strain: Low Risk  (10/15/2021)   Overall Financial Resource Strain (CARDIA)    Difficulty of Paying Living Expenses: Not hard at all  Food Insecurity: No Food Insecurity (10/15/2021)   Hunger Vital Sign    Worried About Running Out of Food in the Last Year: Never true    [place] in the Last Year: Never true  Transportation Needs: No Transportation Needs (10/15/2021)   PRAPARE - Hydrologist (Medical): No    Lack of Transportation (Non-Medical): No  Physical Activity: Sufficiently Active (10/15/2021)   Exercise Vital Sign    Days of Exercise per Week: 5 days    Minutes of Exercise per Session: 30 min  Stress: No Stress Concern Present (10/15/2021)   [place]    Feeling of Stress : Not at all  Social Connections: [place] (10/15/2021)   Social Connection and Isolation Panel [NHANES]    Frequency of Communication with Friends and Family: More than three times a week    Frequency of Social Gatherings with Friends and Family: More than three times a week    Attends Religious Services: More than 4 times per year    Active Member of [employer] or Organizations: Yes    Attends Archivist Meetings: More than 4 times per year  Marital Status: Married     Review of Systems: A 12 point ROS discussed and pertinent positives are indicated in the HPI above.  All other systems are negative.  Review of Systems  Constitutional:  Negative for fatigue and fever.  Respiratory:  Negative for cough and shortness of breath.   Cardiovascular:  Negative for chest pain.  Gastrointestinal:  Negative for abdominal pain, nausea and vomiting.  Musculoskeletal:  Negative for back pain.  Psychiatric/Behavioral:  Negative for behavioral problems and confusion.     Vital Signs: BP 109/61   Pulse 63   Temp 98 F (36.7 C) (Axillary)   Resp (!) 9   Wt 283 lb 11.7 oz (128.7 kg)   SpO2 100%   BMI 40.71 kg/m   Physical Exam Vitals and nursing note reviewed.  Constitutional:      General: He is not in acute distress.    Appearance: Normal appearance. He is not ill-appearing.  HENT:     Mouth/Throat:     Mouth: Mucous membranes are moist.     Pharynx: Oropharynx is clear.  Cardiovascular:     Rate and Rhythm: Normal rate and regular rhythm.  Pulmonary:     Effort: Pulmonary effort is normal.     Breath sounds: Normal breath sounds.  Abdominal:     General: Abdomen is flat.     Palpations: Abdomen is soft.  Skin:    General: Skin is warm and dry.  Neurological:     General: No focal deficit present.     Mental Status: He is alert and oriented to person, place, and time. Mental status is at baseline.   Psychiatric:        Mood and Affect: Mood normal.        Behavior: Behavior normal.        Thought Content: Thought content normal.        Judgment: Judgment normal.      MD Evaluation Airway: WNL Heart: WNL Abdomen: WNL Chest/ Lungs: WNL ASA  Classification: 3 Mallampati/Airway Score: Two   Imaging: VAS [place] LOWER EXTREMITY VENOUS (DVT)  Result Date: 08/13/2022  Lower Venous DVT Study Patient Name:  Kurt Blair  Date of Exam:   08/13/2022 Medical Rec #: [id number]       Accession #:    [id number] Date of Birth: 12-Feb-1946       Patient Gender: M Patient Age:   59 years Exam Location:  [facility] Procedure:      VAS [place] LOWER EXTREMITY VENOUS (DVT) Referring Phys: Larey Days --------------------------------------------------------------------------------  Indications: Bilateral lower extremity edema.  Limitations: Body habitus and poor ultrasound/tissue interface. Skin changes. Comparison Study: 11-25-2017 Prior bilateral lower extremity venous study was                   negative for DVT. Performing Technologist: Darlin Coco RDMS, RVT  Examination Guidelines: A complete evaluation includes B-mode imaging, spectral Doppler, color Doppler, and power Doppler as needed of all accessible portions of each vessel. Bilateral testing is considered an integral part of a complete examination. Limited examinations for reoccurring indications may be performed as noted. The reflux portion of the exam is performed with the patient in reverse Trendelenburg.  +---------+---------------+---------+-----------+----------+---------------+ RIGHT    CompressibilityPhasicitySpontaneityPropertiesThrombus Aging  +---------+---------------+---------+-----------+----------+---------------+ CFV      Full           Yes      Yes                                  +---------+---------------+---------+-----------+----------+---------------+  SFJ      Full                                                          +---------+---------------+---------+-----------+----------+---------------+ FV Prox  Full                                                         +---------+---------------+---------+-----------+----------+---------------+ FV Mid   Full                                                         +---------+---------------+---------+-----------+----------+---------------+ FV DistalFull                                                         +---------+---------------+---------+-----------+----------+---------------+ PFV      Full                                                         +---------+---------------+---------+-----------+----------+---------------+ POP      Full           Yes      Yes                                  +---------+---------------+---------+-----------+----------+---------------+ PTV                     Yes      Yes                  Patent by color +---------+---------------+---------+-----------+----------+---------------+ PERO                    Yes      Yes                  Patent by color +---------+---------------+---------+-----------+----------+---------------+ Gastroc  Full                                                         +---------+---------------+---------+-----------+----------+---------------+   +---------+---------------+---------+-----------+----------+-----------------+ LEFT     CompressibilityPhasicitySpontaneityPropertiesThrombus Aging    +---------+---------------+---------+-----------+----------+-----------------+ CFV      Full           Yes      Yes                                    +---------+---------------+---------+-----------+----------+-----------------+  SFJ      Full                                                           +---------+---------------+---------+-----------+----------+-----------------+ FV Prox  Partial                                      Age  Indeterminate +---------+---------------+---------+-----------+----------+-----------------+ FV Mid   Partial                                      Age Indeterminate +---------+---------------+---------+-----------+----------+-----------------+ FV DistalPartial                                      Age Indeterminate +---------+---------------+---------+-----------+----------+-----------------+ PFV      Full                                                           +---------+---------------+---------+-----------+----------+-----------------+ POP      Partial        Yes      Yes                  Age Indeterminate +---------+---------------+---------+-----------+----------+-----------------+ PTV                     Yes      Yes                  Patent by color   +---------+---------------+---------+-----------+----------+-----------------+ PERO                    Yes      Yes                  Patent by color   +---------+---------------+---------+-----------+----------+-----------------+ Gastroc  None           No       No                   Age Indeterminate +---------+---------------+---------+-----------+----------+-----------------+     Summary: RIGHT: - There is no evidence of deep vein thrombosis in the lower extremity.  - No cystic structure found in the popliteal fossa. - Prolonged venous reflux times noted.  LEFT: - Findings consistent with age indeterminate deep vein thrombosis involving the left femoral vein, left popliteal vein, and left gastrocnemius veins. - No cystic structure found in the popliteal fossa. - Prolonged venous reflux times noted.  *See table(s) above for measurements and observations. Electronically signed by Monica Martinez MD on 08/13/2022 at 2:23:10 PM.    Final    CT CHEST ABDOMEN PELVIS WO CONTRAST  Addendum Date: 08/13/2022   ADDENDUM REPORT: 08/13/2022 10:52 ADDENDUM: Study discussed by telephone with Dr. Alfonse Spruce on 08/13/2022 at 1035  hours. Electronically Signed   By: Genevie Ann M.D.   On: 08/13/2022 10:52   Result Date: 08/13/2022 CLINICAL DATA:  77 year old  male status post cardiac arrest. Positive pulmonary emboli on recent CTA. Query retroperitoneal hematoma. EXAM: CT CHEST, ABDOMEN AND PELVIS WITHOUT CONTRAST TECHNIQUE: Multidetector CT imaging of the chest, abdomen and pelvis was performed following the standard protocol without IV contrast. RADIATION DOSE REDUCTION: This exam was performed according to the departmental dose-optimization program which includes automated exposure control, adjustment of the mA and/or kV according to patient size and/or use of iterative reconstruction technique. COMPARISON:  Portable abdomen radiograph 08/12/2022. Portable chest 08/11/2022. CTA chest 08/10/2022. FINDINGS: CT CHEST FINDINGS Cardiovascular: Small volume pericardial effusion is more apparent than on 08/10/2022. Calcified coronary artery atherosclerosis. No cardiomegaly. Vascular patency is not evaluated in the absence of IV contrast. Right IJ approach central line now in place, terminates at the SVC level. Mediastinum/Nodes: Enteric tube courses in the esophagus, noncontrast superior mediastinum appears negative. However, there is a new subcarinal intermediate density (77 Hounsfield units) rounded masslike area which tracks toward the right pulmonary veins on series 3, image 31. This is 21 by 52 x 31 mm, for an estimated volume of 17 mL. This collection is to the right and lateral of the descending distal esophagus. No significant regional posterior mediastinal mass effect. No other noncontrast mediastinal abnormality. Lungs/Pleura: Increased and moderate size layering right pleural effusion now but simple fluid density suggesting transudate. Increased and small layering left pleural effusion. Endotracheal tube tip satisfactory above the carina. Major airways remain patent. Increasing dependent lung consolidation which is greater on the right. Air  bronchograms in both lower lobes. Additional asymmetric patchy and peribronchial ground-glass opacity in the upper lungs. Musculoskeletal: Nondisplaced to minimally displaced right anterior 2nd through 7th rib fractures. Similar anterior left side 3rd through 7th anterior rib fractures some at the costochondral junctions. These are likely CPR related and probably unchanged. Superimposed chronic posterior left rib fractures. No acute osseous abnormality identified. In the thoracic spine. CT ABDOMEN PELVIS FINDINGS Hepatobiliary: Vicarious contrast excretion in the gallbladder plus mild cholelithiasis. No pericholecystic inflammation. Negative noncontrast liver. Pancreas: Negative. Spleen: Negative. Adrenals/Urinary Tract: Normal adrenal glands. No hydronephrosis or suspicious renal lesion. No hydroureter. Bladder mostly decompressed by Foley catheter. Stomach/Bowel: Enteric tube terminates in the distal stomach. No dilated large or small bowel. Redundant sigmoid colon is gas distended in its non dependent segment. Extensive diverticulosis of the transverse, descending, and sigmoid colon. Small volume oral contrast in the cecum. No active bowel inflammation identified. No free air. Stomach and duodenum appear negative. Small volume oral contrast in the gastric fundus. Vascular/Lymphatic: Normal caliber abdominal aorta. Aortoiliac calcified atherosclerosis. Vascular patency is not evaluated in the absence of IV contrast. Mild retroperitoneal inflammatory stranding on the right side but no discrete retroperitoneal hematoma. No lymphadenopathy. Reproductive: Urethral catheter in place, otherwise negative. Other: Mild presacral stranding. Small volume of hyperdense fluid free fluid in the anterior pelvis series 3, image 110. Musculoskeletal: Advanced lumbar spine degeneration. Probable benign L4 vertebral body hemangioma. No acute or suspicious osseous lesion identified. But posterior left superior buttock, flank  subcutaneous heterogeneous collection with up to 70 Hounsfield unit density. This is likely subcutaneous hematoma and encompasses 39 x 64 x 74 mm (AP by transverse by CC) for estimated volume up to 90 mL. Other more symmetric bilateral body wall edema in the abdomen and pelvis, also the chest. No other discrete body wall hematoma. IMPRESSION: 1. Positive for a small volume of Hemoperitoneum in the pelvis, origin unclear. Also evidence of a left flank subcutaneous hematoma (estimated volume up to 90 mL). And also cannot exclude  a small volume of Posterior Mediastinal Hematoma which is new from the Chest CTA 08/10/2022 (approximately 17 mL, see series 3, image 31). But no retroperitoneal hemorrhage in the abdomen or pelvis. 2. A small volume pericardial effusion is more apparent than on 08/10/2022. And Increased layering bilateral pleural effusions, with simple fluid density favoring transudate. Increasing dependent lung consolidation which could be pneumonia or pulmonary infarct (recent PE). Additional patchy ground-glass opacity in the upper lungs might be interstitial edema or infectious. 3. No other acute or inflammatory process identified in the noncontrast chest, abdomen, or pelvis. Satisfactory endotracheal tube, enteric tube, Foley catheter. Right IJ approach central line terminates at the SVC level. 4. CPR related bilateral anterior rib fractures. Cholelithiasis. Diverticulosis of the large bowel. Aortic Atherosclerosis (ICD10-I70.0). Electronically Signed: By: Genevie Ann M.D. On: 08/13/2022 10:29   DG Abd Portable 1V  Result Date: 08/12/2022 CLINICAL DATA:  Feeding tube placement EXAM: PORTABLE ABDOMEN - 1 VIEW COMPARISON:  08/10/2022 FINDINGS: Tip of feeding tube is seen in the region of the antrum of the stomach. Small amount of contrast is seen in the lumen of fundus of the stomach. Transverse diameter of heart is increased. Small to moderate bilateral pleural effusions are seen. IMPRESSION: Tip of  feeding tube is seen in the antrum of the stomach. Electronically Signed   By: Elmer Picker M.D.   On: 08/12/2022 12:01   DG CHEST PORT 1 VIEW  Result Date: 08/11/2022 CLINICAL DATA:  Shortness of breath EXAM: PORTABLE CHEST 1 VIEW COMPARISON:  Portable exam 1315 hours compared to 0901 hours FINDINGS: Tip of endotracheal tube projects 4.8 cm above carina. Nasogastric tube extends into stomach. RIGHT jugular line tip projects over SVC. Upper normal size of cardiac silhouette. Mediastinal contours and pulmonary vascularity normal. Infiltrates at mid to lower lungs bilaterally with small bibasilar effusions. No pneumothorax. IMPRESSION: Bibasilar infiltrates and small pleural effusions. Electronically Signed   By: Lavonia Dana M.D.   On: 08/11/2022 13:29   US RENAL  Result Date: 08/11/2022 CLINICAL DATA:  Renal failure. EXAM: RENAL / URINARY TRACT ULTRASOUND COMPLETE COMPARISON:  None Available. FINDINGS: Evaluation of the kidneys is suboptimal due to body habitus. Right Kidney: Renal measurements: 12.0 cm x 6.3 cm x 7.3 cm = volume: 290 mL. Echogenicity within normal limits. No mass or hydronephrosis visualized. Left Kidney: Renal measurements: 12.3 cm x 6.4 cm x 5.8 cm = volume: 234 mL. Echogenicity within normal limits. No mass or hydronephrosis visualized. Bladder: Not identified. Other: None. IMPRESSION: The bladder could not be identified. Otherwise normal renal ultrasound. Electronically Signed   By: Valetta Mole M.D.   On: 08/11/2022 12:23   DG CHEST PORT 1 VIEW  Result Date: 08/11/2022 CLINICAL DATA:  Provided history: Shortness of breath. EXAM: PORTABLE CHEST 1 VIEW COMPARISON:  Prior chest radiographs 08/10/2022 and earlier. Chest CT 08/10/2022. FINDINGS: ET tube present with tip at the level of the clavicular heads. Right IJ approach central venous catheter with tip at the level of the mid SVC. An enteric tube passes below the level of the left hemidiaphragm and terminates outside of the  field of view. The cardiomediastinal silhouette is unchanged. Ill-defined opacities within the mid and lower lung fields, bilaterally, progressed on the right. Background prominence of interstitial lung markings compatible with interstitial edema. Blunting of the bilateral costophrenic angles, which could reflect the presence of small bilateral pleural effusions. No evidence of pneumothorax. No acute osseous abnormality identified. IMPRESSION: 1. Support apparatus, as described. 2. Ill-defined opacities within  the mid and lower lung fields, bilaterally, progressed on the right. This may reflect atelectasis, pneumonia and/or pulmonary infarcts. 3. Background interstitial edema. 4. Blunting of the bilateral costophrenic angles, raising the possibility of small bilateral pleural effusions. Electronically Signed   By: Kellie Simmering D.O.   On: 08/11/2022 09:33   CT Angio Chest Pulmonary Embolism (PE) W or WO Contrast  Addendum Date: 08/10/2022   ADDENDUM REPORT: 08/10/2022 12:43 ADDENDUM: Critical Value/emergent results were called by telephone at the time of interpretation on 08/10/2022 at 12:43 pm to provider Alfonse Spruce , who verbally acknowledged these results. Electronically Signed   By: Lajean Manes M.D.   On: 08/10/2022 12:43   Result Date: 08/10/2022 CLINICAL DATA:  Witnessed cardiac arrest while patient trying to walk to the bathroom. EXAM: CT ANGIOGRAPHY CHEST WITH CONTRAST TECHNIQUE: Multidetector CT imaging of the chest was performed using the standard protocol during bolus administration of intravenous contrast. Multiplanar CT image reconstructions and MIPs were obtained to evaluate the vascular anatomy. RADIATION DOSE REDUCTION: This exam was performed according to the departmental dose-optimization program which includes automated exposure control, adjustment of the mA and/or kV according to patient size and/or use of iterative reconstruction technique. CONTRAST:  37mL OMNIPAQUE IOHEXOL 350 MG/ML SOLN  COMPARISON:  03/24/2018. FINDINGS: Cardiovascular: Pulmonary arteries are well opacified. There are predominantly right-sided pulmonary emboli. Partly occlusive pulmonary emboli are noted in the proximal right lower lobe segmental vessels. A strand-like pulmonary embolus is noted in the central right middle lobe pulmonary arteries with another similar strand-like pulmonary embolus noted in the central right upper lobe pulmonary arteries. Additional small pulmonary emboli are noted to right upper lobe segmental branches. Possible small pulmonary embolus to a subsegmental branch to the left upper lobe. A lower lobe segmental pulmonary artery to the lateral basilar segment is truncated in area of left lower lobe atelectasis/consolidation, etiology not defined but suspected to be due to an occlusive pulmonary embolism. Heart normal in size. Trace pericardial effusion. Mild left coronary artery and circumflex coronary artery calcifications. Aorta not opacified, normal in caliber. Main pulmonary artery mildly dilated to 3.8 cm. RV LV ratio calculated at 1.78, elevated. Mediastinum/Nodes: Well-positioned endotracheal tube, 2.1 cm above the carina, and nasogastric tube. No neck base, mediastinal or hilar masses. No enlarged lymph nodes. Trachea and esophagus are unremarkable. Lungs/Pleura: Dependent opacities noted in both lower and upper lobes consistent with atelectasis. There are patchy ground-glass opacities in both upper lobes and the right middle lobe. No pneumothorax.  Trace right pleural effusion. Upper Abdomen: Trace ascites between the liver and right hemidiaphragm. No visualized acute abnormality. Musculoskeletal: Nondisplaced fractures of the anterolateral right fourth and fifth ribs. Review of the MIP images confirms the above findings. IMPRESSION: 1. Positive for pulmonary emboli, predominantly right-sided as detailed above. Positive for acute PE with CT evidence of right heart strain (RV/LV Ratio = 1.78)  consistent with at least submassive (intermediate risk) PE. The presence of right heart strain has been associated with an increased risk of morbidity and mortality. Please refer to the "Code PE Focused" order set in EPIC. 2. Significant dependent atelectasis in the lungs with trace right pleural effusion. 3. Bilateral ground-glass opacities that may reflect edema, but multifocal infection should be considered in the proper clinical setting. 4. Nondisplaced right anterolateral fourth and fifth rib fractures which may be the consequence of CPR. 5. Well-positioned endotracheal tube and nasal/orogastric tube. 6. Mild coronary artery calcifications. Electronically Signed: By: Lajean Manes M.D. On: 08/10/2022 12:22   CT  HEAD WO CONTRAST (5MM)  Result Date: 08/10/2022 CLINICAL DATA:  Cardiac arrest, witnessed by family while trying to walk to the bathroom. EXAM: CT HEAD WITHOUT CONTRAST TECHNIQUE: Contiguous axial images were obtained from the base of the skull through the vertex without intravenous contrast. RADIATION DOSE REDUCTION: This exam was performed according to the departmental dose-optimization program which includes automated exposure control, adjustment of the mA and/or kV according to patient size and/or use of iterative reconstruction technique. COMPARISON:  None Available. FINDINGS: Brain: No evidence of acute infarction, hemorrhage, hydrocephalus, extra-axial collection or mass lesion/mass effect. Patchy bilateral periventricular white matter hypoattenuation noted consistent with mild chronic microvascular ischemic change. Vascular: No hyperdense vessel or unexpected calcification. Skull: Normal. Negative for fracture or focal lesion. Sinuses/Orbits: Globes and orbits are unremarkable. Mild ethmoid sinus mucosal thickening. Other: None. IMPRESSION: 1. No acute intracranial abnormalities. Electronically Signed   By: Lajean Manes M.D.   On: 08/10/2022 12:05   ECHOCARDIOGRAM COMPLETE  Result Date:  08/10/2022    ECHOCARDIOGRAM REPORT   Patient Name:   Kurt Blair Date of Exam: 08/10/2022 Medical Rec #:  [id number]      Height:       70.0 in Accession #:    [id number]     Weight:       268.0 lb Date of Birth:  Jul 04, 1945      BSA:          2.364 m Patient Age:    43 years       BP:           90/66 mmHg Patient Gender: M              HR:           59 bpm. Exam Location:  Inpatient Procedure: 2D Echo, Cardiac Doppler, Color Doppler and Intracardiac            Opacification Agent Indications:    Cardiomyopathy-Unspecified I42.9  History:        Patient has prior history of Echocardiogram examinations, most                 recent 10/08/2017. CHF, COPD, Signs/Symptoms:Dyspnea; Risk                 Factors:Hypertension, Dyslipidemia and Current Smoker.  Sonographer:    Ronny Flurry Referring Phys: [id number] PEDRO EDUARDO CARDAMA  Sonographer Comments: Technically difficult study due to poor echo windows, suboptimal apical window and echo performed with patient supine and on artificial respirator. IMPRESSIONS  1. Difficult acoustic windows limit study.  2. Poor acoustic windows No obvious wall motion abnormalities.. Left ventricular ejection fraction, by estimation, is 55 to 60%. The left ventricle has normal function. There is mild left ventricular hypertrophy.  3. RV free wall difficult to see well Overall function appears to probably be moderately down at least. COnsider MRI to further define wall motion, function. . Right ventricular systolic function is mildly reduced. The right ventricular size is mildly enlarged.  4. The mitral valve is grossly normal. Trivial mitral valve regurgitation.  5. The aortic valve is tricuspid. Aortic valve regurgitation is not visualized.  6. The inferior vena cava is dilated in size with <50% respiratory variability, suggesting right atrial pressure of 15 mmHg. FINDINGS  Left Ventricle: Poor acoustic windows No obvious wall motion abnormalities. Left ventricular ejection fraction,  by estimation, is 55 to 60%. The left ventricle has normal function. Definity contrast agent was given IV to delineate the left ventricular  endocardial borders. The left ventricular internal cavity size was normal in size. There is mild left ventricular hypertrophy. Right Ventricle: RV free wall difficult to see well Overall function appears to probably be moderately down at least. COnsider MRI to further define wall motion, function. The right ventricular size is mildly enlarged. Right vetricular wall thickness was  not assessed. Right ventricular systolic function is mildly reduced. Left Atrium: Left atrial size was normal in size. Right Atrium: Right atrial size was normal in size. Pericardium: Trivial pericardial effusion is present. Mitral Valve: The mitral valve is grossly normal. Trivial mitral valve regurgitation. Tricuspid Valve: The tricuspid valve is grossly normal. Tricuspid valve regurgitation is trivial. Aortic Valve: The aortic valve is tricuspid. Aortic valve regurgitation is not visualized. Aortic valve mean gradient measures 4.0 mmHg. Aortic valve peak gradient measures 8.4 mmHg. Aortic valve area, by VTI measures 2.64 cm. Pulmonic Valve: The pulmonic valve was normal in structure. Pulmonic valve regurgitation is not visualized. Aorta: The aortic root and ascending aorta are structurally normal, with no evidence of dilitation. Venous: The inferior vena cava is dilated in size with less than 50% respiratory variability, suggesting right atrial pressure of 15 mmHg. IAS/Shunts: No atrial level shunt detected by color flow Doppler.  LEFT VENTRICLE PLAX 2D LVIDd:         4.20 cm   Diastology LVIDs:         3.30 cm   LV e' medial:   6.35 cm/s LV PW:         1.30 cm   LV E/e' medial: 8.1 LV IVS:        1.10 cm LVOT diam:     2.40 cm LV SV:         77 LV SV Index:   32 LVOT Area:     4.52 cm  RIGHT VENTRICLE             IVC RV S prime:     12.00 cm/s  IVC diam: 2.80 cm TAPSE (M-mode): 1.9 cm LEFT ATRIUM            Index        RIGHT ATRIUM           Index LA diam:      3.90 cm 1.65 cm/m   RA Area:     22.20 cm LA Vol (A2C): 47.0 ml 19.88 ml/m  RA Volume:   64.40 ml  27.24 ml/m LA Vol (A4C): 57.7 ml 24.41 ml/m  AORTIC VALVE AV Area (Vmax):    3.17 cm AV Area (Vmean):   3.05 cm AV Area (VTI):     2.64 cm AV Vmax:           145.00 cm/s AV Vmean:          94.500 cm/s AV VTI:            0.291 m AV Peak Grad:      8.4 mmHg AV Mean Grad:      4.0 mmHg LVOT Vmax:         101.62 cm/s LVOT Vmean:        63.660 cm/s LVOT VTI:          0.170 m LVOT/AV VTI ratio: 0.58  AORTA Ao Root diam: 3.70 cm Ao Asc diam:  3.80 cm MITRAL VALVE               TRICUSPID VALVE MV Area (PHT): 2.95 cm    TR Peak grad:  5.4 mmHg MV Decel Time: 257 msec    TR Vmax:        116.00 cm/s MV E velocity: 51.40 cm/s MV A velocity: 66.50 cm/s  SHUNTS MV E/A ratio:  0.77        Systemic VTI:  0.17 m                            Systemic Diam: 2.40 cm Dorris Carnes MD Electronically signed by Dorris Carnes MD Signature Date/Time: 08/10/2022/11:51:06 AM    Final    DG CHEST PORT 1 VIEW  Result Date: 08/10/2022 CLINICAL DATA:  Status post central line placement. EXAM: PORTABLE CHEST 1 VIEW COMPARISON:  Single-view of the chest 08/10/2022 at 5:24 a.m. FINDINGS: New right IJ catheter is in place with the tip in the mid superior vena cava. Endotracheal tube and NG tube unchanged. Defibrillator pads now in place. Left basilar atelectasis again seen. Right is clear. No pneumothorax. IMPRESSION: 1. New right IJ catheter tip is in the mid superior vena cava. No pneumothorax. 2. No change in left basilar atelectasis. Electronically Signed   By: Inge Rise M.D.   On: 08/10/2022 09:56   DG Chest Portable 1 View  Result Date: 08/10/2022 CLINICAL DATA:  Intubation EXAM: PORTABLE CHEST 1 VIEW COMPARISON:  08/03/2022 FINDINGS: Endotracheal tube with tip at the clavicular heads. An enteric tube at least reaches the stomach. Bilateral airspace disease greater on the  right. There could be small volume pleural fluid on the left. No pneumothorax. Generous heart size accentuated by technique. IMPRESSION: 1. Located endotracheal and enteric tubes. 2. Low volume chest with perihilar airspace disease suggesting pneumonia or aspiration. Electronically Signed   By: Jorje Guild M.D.   On: 08/10/2022 05:47   DG Abd Portable 1 View  Result Date: 08/10/2022 CLINICAL DATA:  Verify orogastric tube EXAM: PORTABLE ABDOMEN - 1 VIEW COMPARISON:  None Available. FINDINGS: There is an enteric tube with tip and side port at the stomach. Extensive artifact from support hardware. Lower chest as described on dedicated radiograph. No gas dilated bowel. IMPRESSION: Located enteric tube with tip and side port at the stomach. Electronically Signed   By: Jorje Guild M.D.   On: 08/10/2022 05:45   DG Chest 2 View  Result Date: 08/03/2022 CLINICAL DATA:  Cough and shortness of breath. Lung surgery 30 years ago. EXAM: CHEST - 2 VIEW COMPARISON:  06/10/2018 FINDINGS: Lungs are adequately inflated without focal airspace consolidation or effusion. Mild stable elevation of the left hemidiaphragm. Cardiomediastinal silhouette and remainder of the exam is unchanged. IMPRESSION: No active cardiopulmonary disease. Electronically Signed   By: Marin Olp M.D.   On: 08/03/2022 12:19    Labs:  CBC: Recent Labs    08/11/22 1315 08/11/22 1345 08/12/22 0350 08/13/22 0415 08/13/22 1445  WBC 13.4*  --  10.7* 9.5 8.5  HGB 13.2 13.3 12.0* 9.3* 9.0*  HCT 37.4* 39.0 32.8* 24.8* 24.8*  PLT 115*  --  122* 115* 110*    COAGS: Recent Labs    08/10/22 0615 08/10/22 1401  INR 1.4* 1.6*  APTT 35 52*    BMP: Recent Labs    08/11/22 0445 08/11/22 1045 08/11/22 1315 08/11/22 1345 08/12/22 0350 08/13/22 0415  NA 138  --  136 134* 135 136  K 5.6*   < > 5.1 5.0 4.7 4.0  CL 98  --  98  --  98 98  CO2 29  --  28  --  24 24  GLUCOSE 137*  --  135*  --  168* 130*  BUN 31*  --  34*  --  38*  48*  CALCIUM 7.4*  --  7.3*  --  7.3* 7.4*  CREATININE 2.80*  --  3.12*  --  2.85* 2.60*  GFRNONAA 23*  --  20*  --  22* 25*   < > = values in this interval not displayed.    LIVER FUNCTION TESTS: Recent Labs    02/12/22 1431 08/03/22 1213 08/10/22 0615 08/11/22 0445  BILITOT 1.1 1.3* 1.2 2.2*  AST 23 20 94* 50*  ALT 15 11 46* 33  ALKPHOS 65 63 80 52  PROT 6.7 7.7 5.6* 4.9*  ALBUMIN 3.9 4.2 2.6* 2.3*    TUMOR MARKERS: No results for input(s): "AFPTM", "CEA", "CA199", "CHROMGRNA" in the last 8760 hours.  Assessment and Plan: Deep vein thrombosis, pulmonary embolism Multiple hematomas, hemoperitoneum Patient with DVT, massive PE with acute repiratory failure requiring intubation.  Now also with small volume hemoperitoneum, left flank soft tissue hematoma, and mediastinal hematoma from bleeding right IJ central line.  Request made for IVC filter placement at the request of CCM. Case reviewed and approved by Dr. Kathlene Cote.  To bedside to discuss with patient and family. He is awake on vent, but not participatory in conversation.  Brother at bedside is updated.  Spoke with wife over the phone who provides consent for procedure.   Risks and benefits discussed with the patient including, but not limited to bleeding, infection, contrast induced renal failure, filter fracture or migration which can lead to emergency surgery or even death, strut penetration with damage or irritation to adjacent structures and caval thrombosis.  All of the patient's questions were answered, patient is agreeable to proceed. Consent signed and in chart.   Thank you for this interesting consult.  I greatly enjoyed meeting Kurt Blair and look forward to participating in their care.  A copy of this report was sent to the requesting provider on this date.  Electronically Signed: Docia Barrier, PA 08/13/2022, 3:16 PM   I spent a total of 40 Minutes    in face to face in clinical consultation,  greater than 50% of which was counseling/coordinating care for DVT, PE, hematoma.

## 2022-08-13 NOTE — Progress Notes (Addendum)
ANTICOAGULATION CONSULT NOTE   Pharmacy Consult for heparin infusion Indication: pulmonary embolus  Allergies  Allergen Reactions   Latex Hives and Rash    Patient Measurements: Weight: 128.7 kg (283 lb 11.7 oz)Total body weight: 121.6 kg Heparin Dosing Weight: 100.3 kg  Vital Signs: Temp: 98.4 F (36.9 C) (04/04 0700) Pulse Rate: 61 (04/04 0700)  Labs: Recent Labs    08/10/22 1400 08/10/22 1401 08/10/22 1506 08/11/22 1315 08/11/22 1345 08/11/22 1614 08/12/22 0350 08/13/22 0415  HGB  --  16.3   < > 13.2 13.3  --  12.0* 9.3*  HCT  --  46.8   < > 37.4* 39.0  --  32.8* 24.8*  PLT  --  127*   < > 115*  --   --  122* 115*  APTT  --  52*  --   --   --   --   --   --   LABPROT  --  19.0*  --   --   --   --   --   --   INR  --  1.6*  --   --   --   --   --   --   HEPARINUNFRC  --   --    < >  --   --  0.45 0.42 0.38  CREATININE  --  2.19*   < > 3.12*  --   --  2.85* 2.60*  TROPONINIHS 161*  --   --   --   --   --   --   --    < > = values in this interval not displayed.     Estimated Creatinine Clearance: 32.6 mL/min (A) (by C-G formula based on SCr of 2.6 mg/dL (H)).   Assessment: 77 yo M admitted s/p cardiac arrest on high dose vasopressors. Pt was found to have pulmonary embolus with R heart strain on CT scan on 08/10/22. Given hemodynamic instability and clinical picture, tenecteplase 50mg  IV x1 was given 08/10/22 at 1313.   Heparin level remains therapeutic (0.38) on infusion at 1200 units/hr Hgb 9.3 - MD aware of Hgb drop. Ok to continue heparin gtt for now and may consider obtaining imaging studies.    Goal of Therapy:  No bolus s/p TNK (4/1) Heparin level 0.3-0.7 units/ml Monitor platelets by anticoagulation protocol: Yes   Plan:  Continue heparin infusion at 1200 units/hr  Daily heparin level and CBC  F/u IJ site bleeding resolution and long term anticoag plans  --  In light of recent CT Ab/Pelvis imaging showing smal volume hemoperitoneum and L flank  subcu hematoma will d/c heparin.   Wilson Singer, PharmD Clinical Pharmacist 08/13/2022 7:24 AM

## 2022-08-13 NOTE — Progress Notes (Signed)
eLink Physician-Brief Progress Note Patient Name: Kurt Blair DOB: 1945-12-31 MRN: [id number]   Date of Service  08/13/2022  HPI/Events of Note  Hypomagnesemia - Mg++ = 1.5 and Creatinine = 2.6.  eICU Interventions  Will replace Mg++.     Intervention Category Major Interventions: Electrolyte abnormality - evaluation and management  Korry Dalgleish Eugene 08/13/2022, 6:52 AM

## 2022-08-14 ENCOUNTER — Inpatient Hospital Stay (HOSPITAL_COMMUNITY): Payer: Medicare Other

## 2022-08-14 DIAGNOSIS — I469 Cardiac arrest, cause unspecified: Secondary | ICD-10-CM | POA: Diagnosis not present

## 2022-08-14 HISTORY — PX: IR IVC FILTER PLMT / S&I /IMG GUID/MOD SED: IMG701

## 2022-08-14 LAB — CBC
HCT: 22.7 % — ABNORMAL LOW (ref 39.0–52.0)
Hemoglobin: 8.2 g/dL — ABNORMAL LOW (ref 13.0–17.0)
MCH: 30.5 pg (ref 26.0–34.0)
MCHC: 36.1 g/dL — ABNORMAL HIGH (ref 30.0–36.0)
MCV: 84.4 fL (ref 80.0–100.0)
Platelets: 107 10*3/uL — ABNORMAL LOW (ref 150–400)
RBC: 2.69 MIL/uL — ABNORMAL LOW (ref 4.22–5.81)
RDW: 14.1 % (ref 11.5–15.5)
WBC: 7.5 10*3/uL (ref 4.0–10.5)
nRBC: 0.3 % — ABNORMAL HIGH (ref 0.0–0.2)

## 2022-08-14 LAB — BASIC METABOLIC PANEL
Anion gap: 11 (ref 5–15)
BUN: 48 mg/dL — ABNORMAL HIGH (ref 8–23)
CO2: 25 mmol/L (ref 22–32)
Calcium: 7.9 mg/dL — ABNORMAL LOW (ref 8.9–10.3)
Chloride: 104 mmol/L (ref 98–111)
Creatinine, Ser: 1.96 mg/dL — ABNORMAL HIGH (ref 0.61–1.24)
GFR, Estimated: 35 mL/min — ABNORMAL LOW (ref >60)
Glucose, Bld: 118 mg/dL — ABNORMAL HIGH (ref 70–99)
Potassium: 3.3 mmol/L — ABNORMAL LOW (ref 3.5–5.1)
Sodium: 140 mmol/L (ref 135–145)

## 2022-08-14 LAB — PREPARE PLATELET PHERESIS: Unit division: 0

## 2022-08-14 LAB — BPAM PLATELET PHERESIS
Blood Product Expiration Date: 202404062359
ISSUE DATE / TIME: 202404051055
ISSUE DATE / TIME: 202404051150

## 2022-08-14 LAB — GLUCOSE, CAPILLARY
Glucose-Capillary: 116 mg/dL — ABNORMAL HIGH (ref 70–99)
Glucose-Capillary: 120 mg/dL — ABNORMAL HIGH (ref 70–99)
Glucose-Capillary: 82 mg/dL (ref 70–99)
Glucose-Capillary: 86 mg/dL (ref 70–99)
Glucose-Capillary: 88 mg/dL (ref 70–99)
Glucose-Capillary: 90 mg/dL (ref 70–99)

## 2022-08-14 LAB — TYPE AND SCREEN

## 2022-08-14 LAB — HEMOGLOBIN AND HEMATOCRIT, BLOOD
HCT: 24.3 % — ABNORMAL LOW (ref 39.0–52.0)
Hemoglobin: 8.8 g/dL — ABNORMAL LOW (ref 13.0–17.0)

## 2022-08-14 LAB — BPAM RBC

## 2022-08-14 LAB — PHOSPHORUS: Phosphorus: 2.9 mg/dL (ref 2.5–4.6)

## 2022-08-14 LAB — PREPARE RBC (CROSSMATCH)

## 2022-08-14 LAB — MAGNESIUM: Magnesium: 2.1 mg/dL (ref 1.7–2.4)

## 2022-08-14 MED ORDER — POTASSIUM CHLORIDE 20 MEQ PO PACK: 20.0000 meq | PACK | ORAL | Status: DC

## 2022-08-14 MED ORDER — POTASSIUM CHLORIDE 10 MEQ/50ML IV SOLN
10.0000 meq | INTRAVENOUS | Status: DC
Start: 2022-08-14 — End: 2022-08-14

## 2022-08-14 MED ORDER — DEXMEDETOMIDINE HCL IN NACL 400 MCG/100ML IV SOLN
0.0000 ug/kg/h | INTRAVENOUS | Status: DC
  Administered 2022-08-14: 0.4 ug/kg/h via INTRAVENOUS
  Administered 2022-08-15: 0.7 ug/kg/h via INTRAVENOUS
  Administered 2022-08-15: 0.2 ug/kg/h via INTRAVENOUS
  Administered 2022-08-15: 0.5 ug/kg/h via INTRAVENOUS
  Administered 2022-08-15: 0.8 ug/kg/h via INTRAVENOUS
  Administered 2022-08-16 (×2): 1.2 ug/kg/h via INTRAVENOUS
  Administered 2022-08-16 (×2): 1 ug/kg/h via INTRAVENOUS
  Administered 2022-08-16: 1.2 ug/kg/h via INTRAVENOUS
  Administered 2022-08-16: 1 ug/kg/h via INTRAVENOUS
  Administered 2022-08-16: 1.2 ug/kg/h via INTRAVENOUS
  Administered 2022-08-16: 0.7 ug/kg/h via INTRAVENOUS
  Administered 2022-08-17: 1.2 ug/kg/h via INTRAVENOUS
  Filled 2022-08-14 (×5): qty 100
  Filled 2022-08-14: qty 200
  Filled 2022-08-14 (×7): qty 100

## 2022-08-14 MED ORDER — SODIUM CHLORIDE 0.9% IV SOLUTION: Freq: Once | INTRAVENOUS | Status: AC

## 2022-08-14 MED ORDER — LABETALOL HCL 5 MG/ML IV SOLN
20.0000 mg | INTRAVENOUS | Status: DC | PRN
  Administered 2022-08-14: 20 mg via INTRAVENOUS
  Filled 2022-08-14: qty 4

## 2022-08-14 MED ORDER — POTASSIUM CHLORIDE 10 MEQ/50ML IV SOLN
10.0000 meq | INTRAVENOUS | Status: AC
  Administered 2022-08-14 (×6): 10 meq via INTRAVENOUS
  Filled 2022-08-14 (×6): qty 50

## 2022-08-14 MED ORDER — VITAL 1.5 CAL PO LIQD
1000.0000 mL | ORAL | Status: DC
  Administered 2022-08-14 – 2022-08-16 (×2): 1000 mL

## 2022-08-14 MED ORDER — LIDOCAINE HCL 1 % IJ SOLN
INTRAMUSCULAR | Status: AC
  Filled 2022-08-14: qty 20

## 2022-08-14 MED ORDER — HYDRALAZINE HCL 20 MG/ML IJ SOLN
10.0000 mg | Freq: Four times a day (QID) | INTRAMUSCULAR | Status: DC | PRN
  Administered 2022-08-14: 10 mg via INTRAVENOUS
  Filled 2022-08-14: qty 1

## 2022-08-14 NOTE — Progress Notes (Signed)
Attempted to bring pt down for IVC filter placement. Per pt's nurse pt going to CT scan for possible head bleed. Nurse to call back to try to bring pt over for procedure from CT

## 2022-08-14 NOTE — Procedures (Signed)
Interventional Radiology Procedure Note  Procedure: Placement of an IVC filter via right CFV approach.  .  Complications: None  Recommendations:  - Routine wound care    Signed,  Yvone Neu. Loreta Ave, DO

## 2022-08-14 NOTE — Sedation Documentation (Signed)
Floor nurse was present to titrate drips.

## 2022-08-14 NOTE — Progress Notes (Signed)
Brief Nutrition Support Note  TF held yesterday afternoon after discovery of hematoma. Discussed in rounds this AM, pt going down for head CT to check for bleeding. Per attending, if negative, ok to restart TF and to being advancing to goal. Discussed with RN.  Greig Castilla, RD, LDN Clinical Dietitian RD pager # available in [facility]  After hours/weekend pager # available in [facility]

## 2022-08-14 NOTE — Progress Notes (Addendum)
This RN w night shift RN (brittany) walked in and noticed a massive hematoma under Right IJ. MD notified and @ bedside. CVP ordered (current CVP reading 13) and H&H for 1200

## 2022-08-14 NOTE — Progress Notes (Signed)
Patient was transported to CT & back to room 2M04 on the ventilator with no problems.

## 2022-08-14 NOTE — Progress Notes (Signed)
Pt transported from 2M04 to IR and back without any complications. RN at bedside.

## 2022-08-14 NOTE — Progress Notes (Signed)
NAME:  Kurt Blair, MRN:  [id number], DOB:  1945/10/04, LOS: 4 ADMISSION DATE:  08/18/2022, CONSULTATION DATE:  09/03/2022 REFERRING MD:  Eudelia Bunch - EM, CHIEF COMPLAINT:  cardiac arrest    History of Present Illness:  77 y/o M PMH diastolic HF, COPD, HTN, PTSD, etoh abuse presented to ED following witnessed arrest at home while walking to bathroom. With EMS -- CPR [phone], [phone]. It sounds like pulses were lost a third time just prior to arrival and pt required several rounds of ACLS, though duration is not clear.   Intubated in ED. Remained shock after ROSC, req NE and Epi. Woke and was purposeful in ER.   Per report, he was SOB preceding arrest for several days (?up to 2 wks).  Was seen by his PCP 3/25 with reports of shortness of breath at that time, but unchanged for 10 years, mild intermittent cough with LE edema.   Pertinent  Medical History  COPD Diastolic HF HTN  Etoh Abuse PTSD   Significant Hospital Events: Including procedures, antibiotic start and stop dates in addition to other pertinent events   4/1 admit to ICU following witnessed OOH arrest x2, and subsequent ED arrest   Interim History / Subjective:   Patient is awake to verbal stimulation.  He is calm and synchronous with the vent.  He is aware to follow commands.  Objective   Blood pressure 109/61, pulse (!) 53, temperature (!) 97.3 F (36.3 C), temperature source Bladder, resp. rate (!) 26, weight 130.8 kg, SpO2 99 %. CVP:  [8 mmHg-11 mmHg] 11 mmHg  Vent Mode: PRVC FiO2 (%):  [40 %-60 %] 50 % Set Rate:  [26 bmp] 26 bmp Vt Set:  [550 mL] 550 mL PEEP:  [8 cmH20] 8 cmH20 Pressure Support:  [10 cmH20] 10 cmH20 Plateau Pressure:  [20 cmH20-24 cmH20] 20 cmH20   Intake/Output Summary (Last 24 hours) at 08/14/2022 0728 Last data filed at 08/14/2022 0600 Gross per 24 hour  Intake 641.21 ml  Output 1105 ml  Net -463.79 ml    Filed Weights   08/12/22 0500 08/13/22 0445 08/14/22 0500  Weight: 127.9 kg 128.7 kg  130.8 kg    Examination: Physical Exam Constitutional:      General: He is not in acute distress.    Appearance: He is ill-appearing.  HENT:     Head: Normocephalic.  Eyes:     General:        Right eye: No discharge.        Left eye: No discharge.     Pupils: Pupils are equal, round, and reactive to light.  Neck:     Comments: Right IJ central line in place.  A large hematoma palpated at right neck.  Cardiovascular:     Rate and Rhythm: Normal rate and regular rhythm.     Heart sounds: No murmur heard.    Comments: +1 edema of LE bilat Pulmonary:     Effort: No respiratory distress.     Breath sounds: Normal breath sounds.  Abdominal:     General: Bowel sounds are normal. There is no distension.     Palpations: Abdomen is soft.     Tenderness: There is no abdominal tenderness.  Skin:    General: Skin is warm.  Neurological:     Comments: Opening eyes to verbal stimulation and follow commands     Resolved Hospital Problem list     Assessment & Plan:  Witnessed OOH Cardiac Arrest, PEA Massive pulmonary embolism  Left LE DVT PE predominantly on the right side with right heart strain RV/LV = 1.78.  However right heart strain may be overestimated.  Poor echocardiogram imaging but his EF is preserved.  He received TNK and on IV heparin but unfortunately developed intra-abdominal bleed as well as Right IJ central line bleeding.  -Hold heparin for now until hemoglobin stabilizes -IR will place an IVC filter -Will need hypercoagulable/malignancy workup when more stable consider this is unprovoked PE.   Acute blood loss anemia Hgb 14.3 - 13.3 - 12 - 9.3 - 8.2 The bleeding is slow but continuously on IV heparin.  He has right neck hematoma from the right IJ as well as posterior mediastinal hematoma, in combination with a hemoperitoneum in the pelvis with unclear origin.  Spoke with IR and there is no obvious source for embolization. -Continue TXA gauze with compression for right  IJ bleeding.  If unable to achieve hemostasis, may need to remove the central line. -Pending H&H at noon.   Shock Likely cardiogenic shock from right heart failure due to PE and also sedation related.  Questionable sepsis from pneumonia.   -Continue stress dose steroid -Continue levo and vasopressor.  -Continue ceftriaxone empirically (day 5).  Nasal MRSA negative -Pending blood culture and sputum culture  Acute respiratory failure with hypoxemia and hypercarbia  COPD (PFT 2019, follows Dr. Sherene Sires) CAP vs Aspiration PNA R>L  Remains intubated.  Still has high pressors requirement. -PRVC with LTVV.  SBT if able -He is on Breztri at home. Continue Brovana, Pulmicort and Yuperi. Duobnebs PRN.  -Continue ceftriaxone empirically for CAP  Hx Diastolic HF, NYHA Class 1 Echocardiogram showed preserved EF. -Hold home benicar, demadex, coreg in the setting of shock -CVP is 13 today. Hold off on Lasix until bleeding stops  AKI Hyperkalemia Suspect ATN in the setting of shock.  He is showing signs of recovery with downtrending creatinine and increased urine output. -Trend BMP.  No indication for CRRT yet  Elevated LFT's NASH -follow LFT's  -anticipate shock liver post arrest   Pre-Diabetes  -SSI   Hypothyroidism -not on meds  -TSH 4.6 3/25   Best Practice (right click and "Reselect all SmartList Selections" daily)  Diet/type: NPO DVT prophylaxis: SCD GI prophylaxis: PPI Lines: Central line. May remove today Foley:  Yes, and it is still needed Code Status:  DNR Last date of multidisciplinary goals of care discussion: DNR. Updated spouse at bedside 4/3 Labs   CBC: Recent Labs  Lab 08/11/22 1315 08/11/22 1345 08/12/22 0350 08/13/22 0415 08/13/22 1445 08/14/22 0445  WBC 13.4*  --  10.7* 9.5 8.5 7.5  HGB 13.2 13.3 12.0* 9.3* 9.0* 8.2*  HCT 37.4* 39.0 32.8* 24.8* 24.8* 22.7*  MCV 85.6  --  84.5 83.2 84.4 84.4  PLT 115*  --  122* 115* 110* 107*     Basic Metabolic  Panel: Recent Labs  Lab 08/11/22 0615 August 11, 2022 0622 08/11/22 0445 08/11/22 1045 08/11/22 1315 08/11/22 1345 08/12/22 0350 08/13/22 0415 08/14/22 0445  NA 138   < > 138  --  136 134* 135 136 140  K 6.0*   < > 5.6*   < > 5.1 5.0 4.7 4.0 3.3*  CL 96*   < > 98  --  98  --  98 98 104  CO2 26   < > 29  --  28  --  24 24 25   GLUCOSE 190*   < > 137*  --  135*  --  168* 130*  118*  BUN 22   < > 31*  --  34*  --  38* 48* 48*  CREATININE 1.89*   < > 2.80*  --  3.12*  --  2.85* 2.60* 1.96*  CALCIUM 8.5*   < > 7.4*  --  7.3*  --  7.3* 7.4* 7.9*  MG 2.1  --   --   --   --   --   --  1.5* 2.1  PHOS 6.6*  --   --   --   --   --   --  3.2 2.9   < > = values in this interval not displayed.    GFR: Estimated Creatinine Clearance: 43.6 mL/min (A) (by C-G formula based on SCr of 1.96 mg/dL (H)). Recent Labs  Lab 08-01-22 0615 08-01-22 [id number] 08-01-22 1401 08-01-22 1402 08/11/22 0445 08/12/22 0350 08/13/22 0415 08/13/22 1445 08/14/22 0445  PROCALCITON <0.10  --   --   --   --   --   --   --   --   WBC 18.0*  --    < >  --    < > 10.7* 9.5 8.5 7.5  LATICACIDVEN 5.5* 7.7*  --  2.2*  --   --   --   --   --    < > = values in this interval not displayed.     Liver Function Tests: Recent Labs  Lab 08-01-22 0615 08/11/22 0445  AST 94* 50*  ALT 46* 33  ALKPHOS 80 52  BILITOT 1.2 2.2*  PROT 5.6* 4.9*  ALBUMIN 2.6* 2.3*    No results for input(s): "LIPASE", "AMYLASE" in the last 168 hours. No results for input(s): "AMMONIA" in the last 168 hours.  ABG    Component Value Date/Time   PHART 7.440 08/11/2022 1345   PCO2ART 41.8 08/11/2022 1345   PO2ART 143 (H) 08/11/2022 1345   HCO3 28.0 08/11/2022 1345   TCO2 29 08/11/2022 1345   ACIDBASEDEF 3.0 (H) April 12, 2023 0622   O2SAT 58.1 08/13/2022 0415     Coagulation Profile: Recent Labs  Lab 08-01-22 0615 08-01-22 1401  INR 1.4* 1.6*     Cardiac Enzymes: No results for input(s): "CKTOTAL", "CKMB", "CKMBINDEX", "TROPONINI" in  the last 168 hours.  HbA1C: Hgb A1c MFr Bld  Date/Time Value Ref Range Status  08/03/2022 12:13 PM 6.4 4.6 - 6.5 % Final    Comment:    Glycemic Control Guidelines for People with Diabetes:Non Diabetic:  <6%Goal of Therapy: <7%Additional Action Suggested:  >8%   02/12/2022 02:31 PM 6.2 4.6 - 6.5 % Final    Comment:    Glycemic Control Guidelines for People with Diabetes:Non Diabetic:  <6%Goal of Therapy: <7%Additional Action Suggested:  >8%     CBG: Recent Labs  Lab 08/13/22 1501 08/13/22 1919 08/13/22 2308 08/14/22 0305 08/14/22 0718  GLUCAP 105* 103* 108* 120* 116*     Review of Systems:   Unable to complete as patient is on mechanical ventilation.   Past Medical History:  He,  has a past medical history of ANXIETY (02/16/2007), DEPRESSION (02/16/2007), Diverticulosis, ERECTILE DYSFUNCTION (02/16/2007), FATTY LIVER DISEASE (06/07/2008), HYPERGLYCEMIA (02/16/2007), Hyperlipidemia, Internal hemorrhoid, NASH (nonalcoholic steatohepatitis), PULMONARY EMBOLISM, HX OF (02/16/2007), SUBACROMIAL BURSITIS, RIGHT (06/07/2008), Tubular adenoma of colon (07/2001), and VENOUS INSUFFICIENCY (02/16/2007).   Surgical History:   Past Surgical History:  Procedure Laterality Date   COLONOSCOPY     Drainage fluid from chest  1990   Leg stripping  2003  Left      Social History:   reports that he quit smoking about 9 years ago. His smoking use included cigarettes. He has a 25.00 pack-year smoking history. He has never used smokeless tobacco. He reports current alcohol use of about 14.0 standard drinks of alcohol per week. He reports that he does not use drugs.   Family History:  His family history includes Cancer in his mother and sister. There is no history of Colon cancer, Esophageal cancer, Rectal cancer, Stomach cancer, Prostate cancer, or Pancreatic cancer.   Allergies Allergies  Allergen Reactions   Latex Hives and Rash     Home Medications  Prior to Admission medications    Medication Sig Start Date End Date Taking? Authorizing Provider  acetaminophen (TYLENOL) 650 MG CR tablet Take 650 mg by mouth every 8 (eight) hours as needed.    [provider]  albuterol (VENTOLIN HFA) 108 (90 Base) MCG/ACT inhaler TAKE 2 PUFFS BY MOUTH EVERY 6 HOURS AS NEEDED FOR WHEEZE OR SHORTNESS OF BREATH 01/21/22   Etta Grandchild, MD  atorvastatin (LIPITOR) 80 MG tablet TAKE 1 TABLET BY MOUTH EVERY DAY 04/25/22   Etta Grandchild, MD  Budeson-Glycopyrrol-Formoterol 160-9-4.8 MCG/ACT AERO Inhale 2 puffs into the lungs in the morning and at bedtime. 01/28/22   Etta Grandchild, MD  carvedilol (COREG) 12.5 MG tablet TAKE 1 TABLET (12.5MG  TOTAL) BY MOUTH TWICE A DAY WITH MEALS 08/03/22   Etta Grandchild, MD  carvedilol (COREG) 12.5 MG tablet Take 1 tablet (12.5 mg total) by mouth 2 (two) times daily with a meal. 08/03/22   Etta Grandchild, MD  Cholecalciferol 50 MCG (2000 UT) TABS Take 1 tablet (2,000 Units total) by mouth daily. 10/17/19   Etta Grandchild, MD  folic acid (FOLVITE) 1 MG tablet TAKE 1 TABLET BY MOUTH EVERY DAY 07/19/22   Etta Grandchild, MD  KLOR-CON M20 20 MEQ tablet TAKE 1 TABLET BY MOUTH TWICE A DAY 02/15/22   Etta Grandchild, MD  levalbuterol [facility] HFA) 45 MCG/ACT inhaler Inhale 1 puff into the lungs every 6 (six) hours as needed for wheezing. 02/15/21   Etta Grandchild, MD  levocetirizine (XYZAL) 5 MG tablet TAKE 1 TABLET BY MOUTH EVERY DAY IN THE EVENING 02/15/22   Etta Grandchild, MD  Menthol, Topical Analgesic, (ICY HOT PAIN RELIEVING EX) Apply topically daily as needed.    [provider]  Multiple Vitamin (MULTIVITAMIN) tablet Take 1 tablet by mouth daily. Centrum silver - mens 50+    [provider]  olmesartan (BENICAR) 40 MG tablet TAKE 1 TABLET BY MOUTH EVERY DAY 04/25/22   Georgina Quint, MD  torsemide (DEMADEX) 20 MG tablet TAKE 1 TABLET BY MOUTH TWICE A DAY 04/25/22   Etta Grandchild, MD     Critical care time:    Doran Stabler,  DO Internal Medicine Residency My pager: [phone]

## 2022-08-14 NOTE — Progress Notes (Signed)
[facility] ADULT ICU REPLACEMENT PROTOCOL   The patient does apply for the [facility] Adult ICU Electrolyte Replacment Protocol based on the criteria listed below:   1.Exclusion criteria: TCTS, ECMO, Dialysis, and Myasthenia Gravis patients 2. Is GFR >/= 30 ml/min? Yes.    Patient's GFR today is 35 3. Is SCr </= 2? Yes.   Patient's SCr is 1.96 mg/dL 4. Did SCr increase >/= 0.5 in 24 hours? No. 5.Pt's weight >40kg  Yes.   6. Abnormal electrolyte(s): K+ 3.3  7. Electrolytes replaced per protocol 8.  Call MD STAT for K+ </= 2.5, Phos </= 1, or Mag </= 1 Physician:  Franne Grip [facility] 08/14/2022 5:25 AM

## 2022-08-15 ENCOUNTER — Other Ambulatory Visit: Payer: Self-pay

## 2022-08-15 DIAGNOSIS — I469 Cardiac arrest, cause unspecified: Secondary | ICD-10-CM | POA: Diagnosis not present

## 2022-08-15 LAB — CULTURE, BLOOD (ROUTINE X 2)
Culture: NO GROWTH
Culture: NO GROWTH
Special Requests: ADEQUATE
Special Requests: ADEQUATE

## 2022-08-15 LAB — PREPARE PLATELET PHERESIS
Unit division: 0
Unit division: 0

## 2022-08-15 LAB — BPAM PLATELET PHERESIS
Blood Product Expiration Date: 202404072359
Blood Product Expiration Date: 202404082359
ISSUE DATE / TIME: 202404051432
Unit Type and Rh: 6200
Unit Type and Rh: 6200
Unit Type and Rh: 6200

## 2022-08-15 LAB — CBC
HCT: 25.4 % — ABNORMAL LOW (ref 39.0–52.0)
Hemoglobin: 9.1 g/dL — ABNORMAL LOW (ref 13.0–17.0)
MCH: 30.6 pg (ref 26.0–34.0)
MCHC: 35.8 g/dL (ref 30.0–36.0)
MCV: 85.5 fL (ref 80.0–100.0)
Platelets: 117 10*3/uL — ABNORMAL LOW (ref 150–400)
RBC: 2.97 MIL/uL — ABNORMAL LOW (ref 4.22–5.81)
RDW: 14.3 % (ref 11.5–15.5)
WBC: 6.6 10*3/uL (ref 4.0–10.5)
nRBC: 0 % (ref 0.0–0.2)

## 2022-08-15 LAB — BASIC METABOLIC PANEL
Anion gap: 10 (ref 5–15)
Anion gap: 10 (ref 5–15)
BUN: 41 mg/dL — ABNORMAL HIGH (ref 8–23)
BUN: 40 mg/dL — ABNORMAL HIGH (ref 8–23)
CO2: 26 mmol/L (ref 22–32)
CO2: 23 mmol/L (ref 22–32)
Calcium: 8.1 mg/dL — ABNORMAL LOW (ref 8.9–10.3)
Calcium: 8.1 mg/dL — ABNORMAL LOW (ref 8.9–10.3)
Chloride: 104 mmol/L (ref 98–111)
Chloride: 107 mmol/L (ref 98–111)
Creatinine, Ser: 1.68 mg/dL — ABNORMAL HIGH (ref 0.61–1.24)
Creatinine, Ser: 1.53 mg/dL — ABNORMAL HIGH (ref 0.61–1.24)
GFR, Estimated: 42 mL/min — ABNORMAL LOW (ref >60)
GFR, Estimated: 47 mL/min — ABNORMAL LOW (ref >60)
Glucose, Bld: 102 mg/dL — ABNORMAL HIGH (ref 70–99)
Glucose, Bld: 99 mg/dL (ref 70–99)
Potassium: 3.1 mmol/L — ABNORMAL LOW (ref 3.5–5.1)
Potassium: 3.1 mmol/L — ABNORMAL LOW (ref 3.5–5.1)
Sodium: 140 mmol/L (ref 135–145)
Sodium: 140 mmol/L (ref 135–145)

## 2022-08-15 LAB — GLUCOSE, CAPILLARY
Glucose-Capillary: 98 mg/dL (ref 70–99)
Glucose-Capillary: 103 mg/dL — ABNORMAL HIGH (ref 70–99)
Glucose-Capillary: 103 mg/dL — ABNORMAL HIGH (ref 70–99)
Glucose-Capillary: 84 mg/dL (ref 70–99)
Glucose-Capillary: 86 mg/dL (ref 70–99)
Glucose-Capillary: 94 mg/dL (ref 70–99)

## 2022-08-15 LAB — TYPE AND SCREEN
ABO/RH(D): AB POS
Antibody Screen: NEGATIVE
Unit division: 0

## 2022-08-15 LAB — BPAM RBC
Blood Product Expiration Date: 202404162359
ISSUE DATE / TIME: 202404051200
Unit Type and Rh: 8400

## 2022-08-15 MED ORDER — POTASSIUM CHLORIDE 20 MEQ PO PACK
20.0000 meq | PACK | ORAL | Status: AC
  Administered 2022-08-15 (×2): 20 meq
  Filled 2022-08-15 (×2): qty 1

## 2022-08-15 MED ORDER — BISACODYL 10 MG RE SUPP
10.0000 mg | Freq: Once | RECTAL | Status: AC
  Administered 2022-08-15: 10 mg via RECTAL
  Filled 2022-08-15: qty 1

## 2022-08-15 MED ORDER — NOREPINEPHRINE 4 MG/250ML-% IV SOLN
0.0000 ug/min | INTRAVENOUS | Status: DC
  Administered 2022-08-15: 5 ug/min via INTRAVENOUS
  Administered 2022-08-16: 14 ug/min via INTRAVENOUS
  Administered 2022-08-16: 7 ug/min via INTRAVENOUS
  Administered 2022-08-16: 11 ug/min via INTRAVENOUS
  Filled 2022-08-15 (×4): qty 250

## 2022-08-15 MED ORDER — POTASSIUM CHLORIDE 20 MEQ PO PACK: 60.0000 meq | PACK | Freq: Once | ORAL | Status: DC

## 2022-08-15 MED ORDER — FUROSEMIDE 10 MG/ML IJ SOLN
40.0000 mg | Freq: Once | INTRAMUSCULAR | Status: AC
  Administered 2022-08-15: 40 mg via INTRAVENOUS
  Filled 2022-08-15: qty 4

## 2022-08-15 MED ORDER — POTASSIUM CHLORIDE 20 MEQ PO PACK
80.0000 meq | PACK | Freq: Once | ORAL | Status: AC
  Administered 2022-08-15: 80 meq via ORAL
  Filled 2022-08-15: qty 4

## 2022-08-15 MED ORDER — POTASSIUM CHLORIDE 10 MEQ/50ML IV SOLN
10.0000 meq | INTRAVENOUS | Status: AC
  Administered 2022-08-15 (×4): 10 meq via INTRAVENOUS
  Filled 2022-08-15 (×4): qty 50

## 2022-08-15 MED ORDER — NOREPINEPHRINE 4 MG/250ML-% IV SOLN
INTRAVENOUS | Status: AC
  Administered 2022-08-15: 4 ug/min via INTRAVENOUS
  Filled 2022-08-15: qty 250

## 2022-08-15 MED ORDER — BISACODYL 10 MG RE SUPP: 10.0000 mg | Freq: Every day | RECTAL | Status: DC | PRN

## 2022-08-15 MED ORDER — FUROSEMIDE 10 MG/ML IJ SOLN
40.0000 mg | Freq: Once | INTRAMUSCULAR | Status: DC
  Filled 2022-08-15: qty 4

## 2022-08-15 NOTE — Progress Notes (Signed)
[facility] ADULT ICU REPLACEMENT PROTOCOL   The patient does apply for the [facility] Adult ICU Electrolyte Replacment Protocol based on the criteria listed below:   1.Exclusion criteria: TCTS, ECMO, Dialysis, and Myasthenia Gravis patients 2. Is GFR >/= 30 ml/min? Yes.    Patient's GFR today is 42 3. Is SCr </= 2? Yes.   Patient's SCr is 1.68 mg/dL 4. Did SCr increase >/= 0.5 in 24 hours? No. 5.Pt's weight >40kg  Yes.   6. Abnormal electrolyte(s): K+ 3.1  7. Electrolytes replaced per protocol 8.  Call MD STAT for K+ </= 2.5, Phos </= 1, or Mag </= 1 Physician:  Larinda Buttery [facility] 08/15/2022 5:17 AM

## 2022-08-15 NOTE — Progress Notes (Signed)
NAME:  Kurt Blair, MRN:  [id number], DOB:  Jun 26, 1945, LOS: 5 ADMISSION DATE:  09/03/2022, CONSULTATION DATE:  08/26/2022 REFERRING MD:  Eudelia Bunchardama - EM, CHIEF COMPLAINT:  cardiac arrest    History of Present Illness:  77 y/o M PMH diastolic HF, COPD, HTN, PTSD, etoh abuse presented to ED following witnessed arrest at home while walking to bathroom. With EMS -- CPR [id number], [phone]. It sounds like pulses were lost a third time just prior to arrival and pt required several rounds of ACLS, though duration is not clear.   Intubated in ED. Remained shock after ROSC, req NE and Epi. Woke and was purposeful in ER.   Per report, he was SOB preceding arrest for several days (?up to 2 wks).  Was seen by his PCP 3/25 with reports of shortness of breath at that time, but unchanged for 10 years, mild intermittent cough with LE edema.   Pertinent  Medical History  COPD Diastolic HF HTN  Etoh Abuse PTSD   Significant Hospital Events: Including procedures, antibiotic start and stop dates in addition to other pertinent events   4/1 admit to ICU following witnessed OOH arrest x2, and subsequent ED arrest, noted to have PE - given thrombolytic therapy and started on heparin gtt 4/4 CT Chest abdomen pelvis ordered for drop in hemoglobin, noted small volume hemoperitoneum in pelvis, left flank subcutaneous hematoma and posterior mediastinal hematoma. Right IJ hematoma. 4/5 transfused 1 unit PRBCs and 1 unit platelets. IVC filter placed by IR  Interim History / Subjective:   No acute events overnight CT head negative for acute pathology yesterday IVC filter placed 4/5   Objective   Blood pressure (!) 178/76, pulse (!) 58, temperature (!) 97.5 F (36.4 C), temperature source Oral, resp. rate (!) 22, weight 129.2 kg, SpO2 98 %. CVP:  [7 mmHg-13 mmHg] 7 mmHg  Vent Mode: PRVC FiO2 (%):  [40 %-70 %] 60 % Set Rate:  [26 bmp] 26 bmp Vt Set:  [550 mL] 550 mL PEEP:  [8 cmH20-10 cmH20] 10 cmH20 Pressure  Support:  [12 cmH20] 12 cmH20 Plateau Pressure:  [16 cmH20-25 cmH20] 24 cmH20   Intake/Output Summary (Last 24 hours) at 08/15/2022 0727 Last data filed at 08/15/2022 0700 Gross per 24 hour  Intake 1671 ml  Output 1075 ml  Net 596 ml    Filed Weights   08/13/22 0445 08/14/22 0500 08/15/22 0500  Weight: 128.7 kg 130.8 kg 129.2 kg    Examination: General: ill appearing adult male, obese, lying in ICU bed  HENT: Normocephalic, ETT in place/OG in place Cardio: s1, s2, auscultated, bradycardic, no m/r/g Pulm: course breaths sounds, no wheezing Abd: BS active, soft abdomen  GU: Foley in place Skin: no rashes Extremities: 1+ LE edema bilaterally, warm Neuro: sedated, PERRL  Resolved Hospital Problem list    Assessment & Plan:  Witnessed OOH Cardiac Arrest, PEA Massive pulmonary embolism Left LE DVT - s/p thrombolytic therapy 4/1 -Hold heparin due to hemoglobin drop with multiple hematomas -s/p IVC filter placement 4/5 by  -Will need hypercoagulable/malignancy workup when more stable consider this is unprovoked PE.   Acute respiratory failure with hypoxemia and hypercarbia  COPD  CAP vs Aspiration PNA R>L  - Continue mechanical ventilatory support -PRVC with LTVV.  SBT if able -He is on Breztri at home. Continue Brovana, Pulmicort and Yuperi. Duobnebs PRN.  -Continue ceftriaxone empirically for CAP/aspiration coverage - Fentanyl/Precedex for sedation   Acute blood loss anemia Right IJ hematoma Posterior mediastinal Hematoma  Hemoperitoneum, mild - Monitor H/H - Transfuse for hemoglobin < 8g/dL -Continue TXA gauze with compression for right IJ bleeding.  If unable to achieve hemostasis, may need to remove the central line. -Pending H&H at noon.   Shock Obstructive and cardiogenic - weaned off levophed off - stop stress dose steroids - Continue ceftriaxone empirically (day 6).  Nasal MRSA negative - Pending blood culture and sputum culture    Rib Fractures -  Nondisplaced to minimally displaced right anterior 2nd through 7th rib fractures. Similar anterior left side 3rd through 7th anterior rib fractures some at the costochondral junctions. - CPR related  Hx Diastolic HF, NYHA Class 1 Echocardiogram showed preserved EF. -Hold home benicar, demadex, coreg in the setting of shock -CVP is 14 today - Lasix 40mg  IV this AM  AKI Hypokalemia Suspect ATN in the setting of shock.  He is showing signs of recovery with downtrending creatinine and increased urine output. -Trend BMP.  - K repleted  Elevated LFT's NASH -follow LFT's  -anticipate shock liver post arrest   Pre-Diabetes  -SSI   Hypothyroidism -not on meds  -TSH 4.6 3/25   Best Practice (right click and "Reselect all SmartList Selections" daily)  Diet/type: tubefeeds DVT prophylaxis: SCD GI prophylaxis: PPI Lines: Central line. Foley:  Yes, and it is still needed Code Status:  DNR Last date of multidisciplinary goals of care discussion: DNR   Labs   CBC: Recent Labs  Lab 08/12/22 0350 08/13/22 0415 08/13/22 1445 08/14/22 0445 08/14/22 1823 08/15/22 0353  WBC 10.7* 9.5 8.5 7.5  --  6.6  HGB 12.0* 9.3* 9.0* 8.2* 8.8* 9.1*  HCT 32.8* 24.8* 24.8* 22.7* 24.3* 25.4*  MCV 84.5 83.2 84.4 84.4  --  85.5  PLT 122* 115* 110* 107*  --  117*     Basic Metabolic Panel: Recent Labs  Lab 08/25/2022 0615 08/16/2022 0622 08/11/22 1315 08/11/22 1345 08/12/22 0350 08/13/22 0415 08/14/22 0445 08/15/22 0353  NA 138   < > 136 134* 135 136 140 140  K 6.0*   < > 5.1 5.0 4.7 4.0 3.3* 3.1*  CL 96*   < > 98  --  98 98 104 104  CO2 26   < > 28  --  24 24 25 26   GLUCOSE 190*   < > 135*  --  168* 130* 118* 102*  BUN 22   < > 34*  --  38* 48* 48* 41*  CREATININE 1.89*   < > 3.12*  --  2.85* 2.60* 1.96* 1.68*  CALCIUM 8.5*   < > 7.3*  --  7.3* 7.4* 7.9* 8.1*  MG 2.1  --   --   --   --  1.5* 2.1  --   PHOS 6.6*  --   --   --   --  3.2 2.9  --    < > = values in this interval not  displayed.    GFR: Estimated Creatinine Clearance: 50.5 mL/min (A) (by C-G formula based on SCr of 1.68 mg/dL (H)). Recent Labs  Lab 08/25/2022 0615 08/31/2022 7622 08/29/2022 1401 08/22/2022 1402 08/11/22 0445 08/13/22 0415 08/13/22 1445 08/14/22 0445 08/15/22 0353  PROCALCITON <0.10  --   --   --   --   --   --   --   --   WBC 18.0*  --    < >  --    < > 9.5 8.5 7.5 6.6  LATICACIDVEN 5.5* 7.7*  --  2.2*  --   --   --   --   --    < > =  values in this interval not displayed.     Liver Function Tests: Recent Labs  Lab 08/23/2022 0615 08/11/22 0445  AST 94* 50*  ALT 46* 33  ALKPHOS 80 52  BILITOT 1.2 2.2*  PROT 5.6* 4.9*  ALBUMIN 2.6* 2.3*    No results for input(s): "LIPASE", "AMYLASE" in the last 168 hours. No results for input(s): "AMMONIA" in the last 168 hours.  ABG    Component Value Date/Time   PHART 7.440 08/11/2022 1345   PCO2ART 41.8 08/11/2022 1345   PO2ART 143 (H) 08/11/2022 1345   HCO3 28.0 08/11/2022 1345   TCO2 29 08/11/2022 1345   ACIDBASEDEF 3.0 (H) 09/04/2022 0622   O2SAT 58.1 08/13/2022 0415     Coagulation Profile: Recent Labs  Lab 08/23/2022 0615 09/03/2022 1401  INR 1.4* 1.6*     Cardiac Enzymes: No results for input(s): "CKTOTAL", "CKMB", "CKMBINDEX", "TROPONINI" in the last 168 hours.  HbA1C: Hgb A1c MFr Bld  Date/Time Value Ref Range Status  08/03/2022 12:13 PM 6.4 4.6 - 6.5 % Final    Comment:    Glycemic Control Guidelines for People with Diabetes:Non Diabetic:  <6%Goal of Therapy: <7%Additional Action Suggested:  >8%   02/12/2022 02:31 PM 6.2 4.6 - 6.5 % Final    Comment:    Glycemic Control Guidelines for People with Diabetes:Non Diabetic:  <6%Goal of Therapy: <7%Additional Action Suggested:  >8%     CBG: Recent Labs  Lab 08/14/22 1504 08/14/22 1904 08/14/22 2332 08/15/22 0314 08/15/22 0711  GLUCAP 88 82 90 98 103*      Critical care time: 45 minutes    Melody Comas, MD [facility] Office: [phone]   See Amion for personal pager PCCM on call pager [phone] until 7pm. Please call Elink 7p-7a. [phone]

## 2022-08-15 NOTE — Progress Notes (Signed)
Pt not tolerating SBT at this time due to low RR, low min. ventilation and desat to 87%. Pt placed back on full vent support pt tolerating well at this time. RN at bedside, RT will monitor.

## 2022-08-15 NOTE — Progress Notes (Signed)
Foley switched from latex to non-latex due to allergy.

## 2022-08-16 ENCOUNTER — Inpatient Hospital Stay (HOSPITAL_COMMUNITY): Payer: Medicare Other

## 2022-08-16 DIAGNOSIS — I2609 Other pulmonary embolism with acute cor pulmonale: Secondary | ICD-10-CM | POA: Diagnosis not present

## 2022-08-16 DIAGNOSIS — I469 Cardiac arrest, cause unspecified: Secondary | ICD-10-CM | POA: Diagnosis not present

## 2022-08-16 LAB — ECHOCARDIOGRAM LIMITED
S' Lateral: 2.9 cm
Weight: 4677.28 oz

## 2022-08-16 LAB — BASIC METABOLIC PANEL
Anion gap: 11 (ref 5–15)
BUN: 39 mg/dL — ABNORMAL HIGH (ref 8–23)
CO2: 24 mmol/L (ref 22–32)
Calcium: 8.2 mg/dL — ABNORMAL LOW (ref 8.9–10.3)
Chloride: 108 mmol/L (ref 98–111)
Creatinine, Ser: 1.68 mg/dL — ABNORMAL HIGH (ref 0.61–1.24)
GFR, Estimated: 42 mL/min — ABNORMAL LOW (ref >60)
Glucose, Bld: 142 mg/dL — ABNORMAL HIGH (ref 70–99)
Potassium: 4.1 mmol/L (ref 3.5–5.1)
Sodium: 143 mmol/L (ref 135–145)

## 2022-08-16 LAB — CBC
HCT: 28.1 % — ABNORMAL LOW (ref 39.0–52.0)
Hemoglobin: 9.9 g/dL — ABNORMAL LOW (ref 13.0–17.0)
MCH: 30.7 pg (ref 26.0–34.0)
MCHC: 35.2 g/dL (ref 30.0–36.0)
MCV: 87.3 fL (ref 80.0–100.0)
Platelets: 169 10*3/uL (ref 150–400)
RBC: 3.22 MIL/uL — ABNORMAL LOW (ref 4.22–5.81)
RDW: 14.6 % (ref 11.5–15.5)
WBC: 10.2 10*3/uL (ref 4.0–10.5)
nRBC: 0 % (ref 0.0–0.2)

## 2022-08-16 LAB — HEPATIC FUNCTION PANEL
ALT: 18 U/L (ref 0–44)
AST: 34 U/L (ref 15–41)
Albumin: 2.4 g/dL — ABNORMAL LOW (ref 3.5–5.0)
Alkaline Phosphatase: 61 U/L (ref 38–126)
Bilirubin, Direct: 0.5 mg/dL — ABNORMAL HIGH (ref 0.0–0.2)
Indirect Bilirubin: 0.8 mg/dL (ref 0.3–0.9)
Total Bilirubin: 1.3 mg/dL — ABNORMAL HIGH (ref 0.3–1.2)
Total Protein: 5.5 g/dL — ABNORMAL LOW (ref 6.5–8.1)

## 2022-08-16 LAB — GLUCOSE, CAPILLARY
Glucose-Capillary: 158 mg/dL — ABNORMAL HIGH (ref 70–99)
Glucose-Capillary: 111 mg/dL — ABNORMAL HIGH (ref 70–99)
Glucose-Capillary: 130 mg/dL — ABNORMAL HIGH (ref 70–99)
Glucose-Capillary: 91 mg/dL (ref 70–99)
Glucose-Capillary: 91 mg/dL (ref 70–99)
Glucose-Capillary: 100 mg/dL — ABNORMAL HIGH (ref 70–99)

## 2022-08-16 LAB — COOXEMETRY PANEL
Carboxyhemoglobin: 3.1 % — ABNORMAL HIGH (ref 0.5–1.5)
Methemoglobin: 0.7 % (ref 0.0–1.5)
O2 Saturation: 71.1 %
Total hemoglobin: 11.1 g/dL — ABNORMAL LOW (ref 12.0–16.0)

## 2022-08-16 MED ORDER — SODIUM CHLORIDE 3 % IN NEBU
4.0000 mL | INHALATION_SOLUTION | Freq: Two times a day (BID) | RESPIRATORY_TRACT | Status: AC
  Administered 2022-08-16 – 2022-08-18 (×5): 4 mL via RESPIRATORY_TRACT
  Filled 2022-08-16 (×6): qty 4

## 2022-08-16 MED ORDER — MIDODRINE HCL 5 MG PO TABS
10.0000 mg | ORAL_TABLET | Freq: Three times a day (TID) | ORAL | Status: DC
  Administered 2022-08-16 – 2022-08-17 (×2): 10 mg
  Filled 2022-08-16 (×2): qty 2

## 2022-08-16 MED ORDER — OXYCODONE HCL 5 MG PO TABS: 5.0000 mg | ORAL_TABLET | Freq: Three times a day (TID) | ORAL | Status: DC

## 2022-08-16 MED ORDER — ALBUMIN HUMAN 25 % IV SOLN
25.0000 g | Freq: Four times a day (QID) | INTRAVENOUS | Status: AC
  Administered 2022-08-16: 25 g via INTRAVENOUS
  Administered 2022-08-16: 12.5 g via INTRAVENOUS
  Administered 2022-08-17: 25 g via INTRAVENOUS
  Filled 2022-08-16 (×3): qty 100

## 2022-08-16 MED ORDER — NOREPINEPHRINE 16 MG/250ML-% IV SOLN
0.0000 ug/min | INTRAVENOUS | Status: DC
  Administered 2022-08-16: 11 ug/min via INTRAVENOUS
  Administered 2022-08-18: 19 ug/min via INTRAVENOUS
  Administered 2022-08-18: 12 ug/min via INTRAVENOUS
  Administered 2022-08-19 (×2): 32 ug/min via INTRAVENOUS
  Administered 2022-08-19 – 2022-08-20 (×3): 40 ug/min via INTRAVENOUS
  Filled 2022-08-16 (×8): qty 250

## 2022-08-16 MED ORDER — OXYCODONE HCL 5 MG PO TABS
5.0000 mg | ORAL_TABLET | Freq: Three times a day (TID) | ORAL | Status: DC
  Administered 2022-08-16 – 2022-08-17 (×3): 5 mg
  Filled 2022-08-16 (×5): qty 1

## 2022-08-16 MED ORDER — FUROSEMIDE 10 MG/ML IJ SOLN
20.0000 mg | Freq: Once | INTRAMUSCULAR | Status: AC
  Administered 2022-08-16: 20 mg via INTRAVENOUS
  Filled 2022-08-16: qty 2

## 2022-08-16 NOTE — Progress Notes (Addendum)
eLink Physician-Brief Progress Note Patient Name: FODE GULINO DOB: 03/22/1946 MRN: [id number]   Date of Service  08/16/2022  HPI/Events of Note  Status post PEA arrest with intact neurological status, hemoperitoneum in the pelvis with left flank hematoma.  IVC filter was placed.  Has colonic dilation.  Enteric tube was placed and radiograph/KUB were done.  eICU Interventions    Enteric tube traverses below the diaphragm into what I suspect is dilated stomach.  Okay to use.    2316 - tube feeds held, continue to hold. Place enteric tube to LIS, hold suction for a hour after meds.  0120 -patient has been agitated throughout the day receiving continuous fentanyl infusion, initiated oxycodone, and he seems to respond every time fentanyl boluses given, but these are short lasting episodes.  Not on any opiates at baseline.  Currently running Precedex at 1.2 mg with RASS of -1 to 0 right after fentanyl bolus.  Would recommend titrating off Precedex and using propofol in place for sedation.  Continue analgesic management with fentanyl as prescribed.  8250 - current tube is duotube. Insert OG tube for LIS  Intervention Category Minor Interventions: Routine modifications to care plan (e.g. PRN medications for pain, fever)  Faolan [place] 08/16/2022, 10:21 PM

## 2022-08-16 NOTE — Progress Notes (Signed)
NAME:  Kurt Blair, MRN:  [id number], DOB:  05/16/1945, LOS: 6 ADMISSION DATE:  08/12/2022, CONSULTATION DATE:  08/18/2022 REFERRING MD:  Eudelia Bunchardama - EM, CHIEF COMPLAINT:  cardiac arrest    History of Present Illness:  77 y/o M PMH diastolic HF, COPD, HTN, PTSD, etoh abuse presented to ED following witnessed arrest at home while walking to bathroom. With EMS -- CPR [phone], [phone]. It sounds like pulses were lost a third time just prior to arrival and pt required several rounds of ACLS, though duration is not clear.   Intubated in ED. Remained shock after ROSC, req NE and Epi. Woke and was purposeful in ER.   Per report, he was SOB preceding arrest for several days (?up to 2 wks).  Was seen by his PCP 3/25 with reports of shortness of breath at that time, but unchanged for 10 years, mild intermittent cough with LE edema.   Pertinent  Medical History  COPD Diastolic HF HTN  Etoh Abuse PTSD   Significant Hospital Events: Including procedures, antibiotic start and stop dates in addition to other pertinent events   4/1 admit to ICU following witnessed OOH arrest x2, and subsequent ED arrest, noted to have PE - given thrombolytic therapy and started on heparin gtt 4/4 CT Chest abdomen pelvis ordered for drop in hemoglobin, noted small volume hemoperitoneum in pelvis, left flank subcutaneous hematoma and posterior mediastinal hematoma. Right IJ hematoma. 4/5 transfused 1 unit PRBCs and 1 unit platelets. IVC filter placed by IR 4/6 increasing FiO2 requirements on PEEP of 8  Interim History / Subjective:   PEEP increased to 10 this morning Intermittent episodes of agitation Labile HR and BP   Objective   Blood pressure (!) 131/119, pulse 88, temperature 98.5 F (36.9 C), temperature source Axillary, resp. rate (!) 22, weight 132.6 kg, SpO2 94 %. CVP:  [9 mmHg-27 mmHg] 9 mmHg  Vent Mode: PRVC FiO2 (%):  [50 %-100 %] 100 % Set Rate:  [26 bmp] 26 bmp Vt Set:  [550 mL] 550 mL PEEP:  [8  cmH20] 8 cmH20 Pressure Support:  [12 cmH20] 12 cmH20 Plateau Pressure:  [19 cmH20-26 cmH20] 19 cmH20   Intake/Output Summary (Last 24 hours) at 08/16/2022 0741 Last data filed at 08/16/2022 0600 Gross per 24 hour  Intake 2686.38 ml  Output 1450 ml  Net 1236.38 ml   Filed Weights   08/14/22 0500 08/15/22 0500 08/16/22 0530  Weight: 130.8 kg 129.2 kg 132.6 kg    Examination: General: ill appearing adult male, obese, lying in ICU bed  HENT: Normocephalic, ETT in place/OG in place Cardio: s1, s2, auscultated, rrr, no m/r/g Pulm: course breaths sounds, no wheezing Abd: BS active, soft abdomen  GU: Foley in place Skin: no rashes Extremities: 1+ LE edema bilaterally, warm Neuro: sedated, PERRL  Resolved Hospital Problem list    Assessment & Plan:  Witnessed OOH Cardiac Arrest, PEA Massive pulmonary embolism Left LE DVT - s/p thrombolytic therapy 4/1 -Hold heparin due to hemoglobin drop with multiple hematomas -s/p IVC filter placement 4/5 by  -Will need hypercoagulable/malignancy workup when more stable consider this is unprovoked PE.   Acute respiratory failure with hypoxemia and hypercarbia  COPD  CAP vs Aspiration PNA R>L  - Continue mechanical ventilatory support -PRVC with LTVV.  SBT if able -He is on Breztri at home. Continue Brovana, Pulmicort and Yuperi. Duobnebs PRN.  -Continue ceftriaxone empirically for CAP/aspiration coverage - Fentanyl/Precedex for sedation - start oxycodone 5mg  TID today to wean fentanyl  Acute blood loss anemia Right IJ hematoma Posterior mediastinal Hematoma Hemoperitoneum, mild - Monitor H/H - Transfuse for hemoglobin < 8g/dL -Continue TXA gauze with compression for right IJ bleeding.  Shock Obstructive and cardiogenic - levophed  - stop stress dose steroids - Continue ceftriaxone empirically (day 7).  Nasal MRSA negative - Pending blood culture and sputum culture    Rib Fractures - Nondisplaced to minimally displaced right  anterior 2nd through 7th rib fractures. Similar anterior left side 3rd through 7th anterior rib fractures some at the costochondral junctions. - CPR related  Hx Diastolic HF, NYHA Class 1 Echocardiogram showed preserved EF. -Hold home benicar, demadex, coreg in the setting of shock -CVP is 9 today - Given lasix yesterday but then placed back on levophed in the afternoon  AKI Hypokalemia Suspect ATN in the setting of shock.  He is showing signs of recovery with downtrending creatinine and increased urine output. -Trend BMP.  - K repleted  Elevated LFT's NASH -follow LFT's  -anticipate shock liver post arrest   Pre-Diabetes  -SSI   Hypothyroidism -not on meds  -TSH 4.6 3/25   Best Practice (right click and "Reselect all SmartList Selections" daily)  Diet/type: tubefeeds DVT prophylaxis: SCD GI prophylaxis: PPI Lines: Central line. Foley:  Yes, and it is still needed Code Status:  DNR Last date of multidisciplinary goals of care discussion: DNR   Labs   CBC: Recent Labs  Lab 08/13/22 0415 08/13/22 1445 08/14/22 0445 08/14/22 1823 08/15/22 0353 08/16/22 0340  WBC 9.5 8.5 7.5  --  6.6 10.2  HGB 9.3* 9.0* 8.2* 8.8* 9.1* 9.9*  HCT 24.8* 24.8* 22.7* 24.3* 25.4* 28.1*  MCV 83.2 84.4 84.4  --  85.5 87.3  PLT 115* 110* 107*  --  117* 169    Basic Metabolic Panel: Recent Labs  Lab 09/01/2022 0615 09/03/2022 0622 08/13/22 0415 08/14/22 0445 08/15/22 0353 08/15/22 1533 08/16/22 0340  NA 138   < > 136 140 140 140 143  K 6.0*   < > 4.0 3.3* 3.1* 3.1* 4.1  CL 96*   < > 98 104 104 107 108  CO2 26   < > 24 25 26 23 24   GLUCOSE 190*   < > 130* 118* 102* 99 142*  BUN 22   < > 48* 48* 41* 40* 39*  CREATININE 1.89*   < > 2.60* 1.96* 1.68* 1.53* 1.68*  CALCIUM 8.5*   < > 7.4* 7.9* 8.1* 8.1* 8.2*  MG 2.1  --  1.5* 2.1  --   --   --   PHOS 6.6*  --  3.2 2.9  --   --   --    < > = values in this interval not displayed.   GFR: Estimated Creatinine Clearance: 51.2 mL/min  (A) (by C-G formula based on SCr of 1.68 mg/dL (H)). Recent Labs  Lab 08/11/2022 0615 09/01/2022 1607 08/13/2022 1401 08/26/2022 1402 08/11/22 0445 08/13/22 1445 08/14/22 0445 08/15/22 0353 08/16/22 0340  PROCALCITON <0.10  --   --   --   --   --   --   --   --   WBC 18.0*  --    < >  --    < > 8.5 7.5 6.6 10.2  LATICACIDVEN 5.5* 7.7*  --  2.2*  --   --   --   --   --    < > = values in this interval not displayed.    Liver Function Tests: Recent  Labs  Lab 09/02/2022 0615 08/11/22 0445  AST 94* 50*  ALT 46* 33  ALKPHOS 80 52  BILITOT 1.2 2.2*  PROT 5.6* 4.9*  ALBUMIN 2.6* 2.3*   No results for input(s): "LIPASE", "AMYLASE" in the last 168 hours. No results for input(s): "AMMONIA" in the last 168 hours.  ABG    Component Value Date/Time   PHART 7.440 08/11/2022 1345   PCO2ART 41.8 08/11/2022 1345   PO2ART 143 (H) 08/11/2022 1345   HCO3 28.0 08/11/2022 1345   TCO2 29 08/11/2022 1345   ACIDBASEDEF 3.0 (H) 08/18/2022 0622   O2SAT 58.1 08/13/2022 0415     Coagulation Profile: Recent Labs  Lab 08/19/2022 0615 08/11/2022 1401  INR 1.4* 1.6*    Cardiac Enzymes: No results for input(s): "CKTOTAL", "CKMB", "CKMBINDEX", "TROPONINI" in the last 168 hours.  HbA1C: Hgb A1c MFr Bld  Date/Time Value Ref Range Status  08/03/2022 12:13 PM 6.4 4.6 - 6.5 % Final    Comment:    Glycemic Control Guidelines for People with Diabetes:Non Diabetic:  <6%Goal of Therapy: <7%Additional Action Suggested:  >8%   02/12/2022 02:31 PM 6.2 4.6 - 6.5 % Final    Comment:    Glycemic Control Guidelines for People with Diabetes:Non Diabetic:  <6%Goal of Therapy: <7%Additional Action Suggested:  >8%     CBG: Recent Labs  Lab 08/15/22 1525 08/15/22 1908 08/15/22 2300 08/16/22 0314 08/16/22 0717  GLUCAP 84 86 94 158* 111*     Critical care time: 40 minutes    Melody Comas, MD [facility] Office: [phone]   See Amion for personal pager PCCM on call pager  [phone] until 7pm. Please call Elink 7p-7a. [phone]

## 2022-08-16 NOTE — Progress Notes (Signed)
70 mL of versed wasted with Harle Battiest, RN

## 2022-08-16 NOTE — Progress Notes (Signed)
  Echocardiogram 2D Echocardiogram has been performed.  Kurt Blair 08/16/2022, 10:03 AM

## 2022-08-16 NOTE — Procedures (Signed)
Arterial Catheter Insertion Procedure Note  DOUA CONDELLO  [id number]  Sep 23, 1945  Date:08/16/22  Time:9:17 AM    Provider Performing: Inez Pilgrim    Procedure: Insertion of Arterial Line (78469) without US guidance  Indication(s) Blood pressure monitoring and/or need for frequent ABGs  Consent Unable to obtain consent due to emergent nature of procedure.  Anesthesia None   Time Out Verified patient identification, verified procedure, site/side was marked, verified correct patient position, special equipment/implants available, medications/allergies/relevant history reviewed, required imaging and test results available.   Sterile Technique Maximal sterile technique including full sterile barrier drape, hand hygiene, sterile gown, sterile gloves, mask, hair covering, sterile ultrasound probe cover (if used).   Procedure Description Area of catheter insertion was cleaned with chlorhexidine and draped in sterile fashion. Without real-time ultrasound guidance an arterial catheter was placed into the left radial artery.  Appropriate arterial tracings confirmed on monitor.     Complications/Tolerance None; patient tolerated the procedure well.   EBL Minimal   Specimen(s) None

## 2022-08-17 ENCOUNTER — Inpatient Hospital Stay (HOSPITAL_COMMUNITY): Payer: Medicare Other

## 2022-08-17 ENCOUNTER — Other Ambulatory Visit: Payer: Self-pay | Admitting: Internal Medicine

## 2022-08-17 DIAGNOSIS — I469 Cardiac arrest, cause unspecified: Secondary | ICD-10-CM | POA: Diagnosis not present

## 2022-08-17 DIAGNOSIS — L240 Irritant contact dermatitis due to detergents: Secondary | ICD-10-CM

## 2022-08-17 LAB — EXPECTORATED SPUTUM ASSESSMENT W GRAM STAIN, RFLX TO RESP C

## 2022-08-17 LAB — BASIC METABOLIC PANEL
Anion gap: 11 (ref 5–15)
BUN: 35 mg/dL — ABNORMAL HIGH (ref 8–23)
CO2: 23 mmol/L (ref 22–32)
Calcium: 8.2 mg/dL — ABNORMAL LOW (ref 8.9–10.3)
Chloride: 108 mmol/L (ref 98–111)
Creatinine, Ser: 1.5 mg/dL — ABNORMAL HIGH (ref 0.61–1.24)
GFR, Estimated: 48 mL/min — ABNORMAL LOW (ref >60)
Glucose, Bld: 97 mg/dL (ref 70–99)
Potassium: 4 mmol/L (ref 3.5–5.1)
Sodium: 142 mmol/L (ref 135–145)

## 2022-08-17 LAB — CBC
HCT: 25.9 % — ABNORMAL LOW (ref 39.0–52.0)
Hemoglobin: 9.1 g/dL — ABNORMAL LOW (ref 13.0–17.0)
MCH: 29.9 pg (ref 26.0–34.0)
MCHC: 35.1 g/dL (ref 30.0–36.0)
MCV: 85.2 fL (ref 80.0–100.0)
Platelets: 186 10*3/uL (ref 150–400)
RBC: 3.04 MIL/uL — ABNORMAL LOW (ref 4.22–5.81)
RDW: 14.6 % (ref 11.5–15.5)
WBC: 12.8 10*3/uL — ABNORMAL HIGH (ref 4.0–10.5)
nRBC: 0.2 % (ref 0.0–0.2)

## 2022-08-17 LAB — COOXEMETRY PANEL
Carboxyhemoglobin: 2.5 % — ABNORMAL HIGH (ref 0.5–1.5)
Methemoglobin: <0.7 % (ref 0.0–1.5)
O2 Saturation: 72.4 %
Total hemoglobin: 9 g/dL — ABNORMAL LOW (ref 12.0–16.0)

## 2022-08-17 LAB — MAGNESIUM: Magnesium: 2.2 mg/dL (ref 1.7–2.4)

## 2022-08-17 LAB — GLUCOSE, CAPILLARY
Glucose-Capillary: 102 mg/dL — ABNORMAL HIGH (ref 70–99)
Glucose-Capillary: 123 mg/dL — ABNORMAL HIGH (ref 70–99)
Glucose-Capillary: 82 mg/dL (ref 70–99)
Glucose-Capillary: 87 mg/dL (ref 70–99)
Glucose-Capillary: 83 mg/dL (ref 70–99)
Glucose-Capillary: 76 mg/dL (ref 70–99)

## 2022-08-17 LAB — HEPATIC FUNCTION PANEL
ALT: 15 U/L (ref 0–44)
AST: 22 U/L (ref 15–41)
Albumin: 2.8 g/dL — ABNORMAL LOW (ref 3.5–5.0)
Alkaline Phosphatase: 69 U/L (ref 38–126)
Bilirubin, Direct: 1.4 mg/dL — ABNORMAL HIGH (ref 0.0–0.2)
Indirect Bilirubin: 1.9 mg/dL — ABNORMAL HIGH (ref 0.3–0.9)
Total Bilirubin: 3.3 mg/dL — ABNORMAL HIGH (ref 0.3–1.2)
Total Protein: 5.8 g/dL — ABNORMAL LOW (ref 6.5–8.1)

## 2022-08-17 LAB — PHOSPHORUS: Phosphorus: 3.2 mg/dL (ref 2.5–4.6)

## 2022-08-17 LAB — CULTURE, RESPIRATORY W GRAM STAIN

## 2022-08-17 MED ORDER — FUROSEMIDE 10 MG/ML IJ SOLN: 20.0000 mg | Freq: Two times a day (BID) | INTRAMUSCULAR | Status: DC

## 2022-08-17 MED ORDER — PROPOFOL 1000 MG/100ML IV EMUL
5.0000 ug/kg/min | INTRAVENOUS | Status: DC
  Administered 2022-08-17 (×2): 30 ug/kg/min via INTRAVENOUS
  Administered 2022-08-17: 5 ug/kg/min via INTRAVENOUS
  Administered 2022-08-17: 25 ug/kg/min via INTRAVENOUS
  Administered 2022-08-18 (×5): 30 ug/kg/min via INTRAVENOUS
  Administered 2022-08-19: 15 ug/kg/min via INTRAVENOUS
  Administered 2022-08-19 – 2022-08-20 (×4): 20 ug/kg/min via INTRAVENOUS
  Filled 2022-08-17 (×17): qty 100

## 2022-08-17 MED ORDER — BISACODYL 10 MG RE SUPP
10.0000 mg | Freq: Once | RECTAL | Status: AC
  Administered 2022-08-17: 10 mg via RECTAL
  Filled 2022-08-17: qty 1

## 2022-08-17 MED ORDER — PIPERACILLIN-TAZOBACTAM 3.375 G IVPB
3.3750 g | Freq: Three times a day (TID) | INTRAVENOUS | Status: DC
  Administered 2022-08-17 – 2022-08-20 (×10): 3.375 g via INTRAVENOUS
  Filled 2022-08-17 (×12): qty 50

## 2022-08-17 MED ORDER — VASOPRESSIN 20 UNITS/100 ML INFUSION FOR SHOCK: 0.0000 [IU]/min | INTRAVENOUS | Status: DC

## 2022-08-17 MED ORDER — ALBUMIN HUMAN 25 % IV SOLN
25.0000 g | Freq: Four times a day (QID) | INTRAVENOUS | Status: AC
  Administered 2022-08-17 (×3): 25 g via INTRAVENOUS
  Filled 2022-08-17 (×3): qty 100

## 2022-08-17 MED ORDER — FUROSEMIDE 10 MG/ML IJ SOLN
20.0000 mg | Freq: Two times a day (BID) | INTRAMUSCULAR | Status: DC
  Administered 2022-08-17: 20 mg via INTRAVENOUS
  Filled 2022-08-17: qty 2

## 2022-08-17 MED ORDER — HEPARIN SODIUM (PORCINE) 5000 UNIT/ML IJ SOLN
5000.0000 [IU] | Freq: Three times a day (TID) | INTRAMUSCULAR | Status: DC
  Administered 2022-08-17 – 2022-08-20 (×10): 5000 [IU] via SUBCUTANEOUS
  Filled 2022-08-17 (×9): qty 1

## 2022-08-17 NOTE — Progress Notes (Signed)
Pharmacy Antibiotic Note  ISAHA Blair is a 77 y.o. male admitted on 09/02/2022 with pneumonia.  Pharmacy has been consulted for Zosyn dosing.  Plan: Zosyn 3.375g IV q8h (4 hour infusion). Monitor renal function and clinical status  Weight: 130.3 kg (287 lb 4.2 oz)  Temp (24hrs), Avg:99.5 F (37.5 C), Min:98.7 F (37.1 C), Max:100.1 F (37.8 C)  Recent Labs  Lab 09/03/2022 1402 08/11/22 0445 08/13/22 1445 08/14/22 0445 08/15/22 0353 08/15/22 1533 08/16/22 0340 08/17/22 0500  WBC  --    < > 8.5 7.5 6.6  --  10.2 12.8*  CREATININE  --    < >  --  1.96* 1.68* 1.53* 1.68* 1.50*  LATICACIDVEN 2.2*  --   --   --   --   --   --   --    < > = values in this interval not displayed.    Estimated Creatinine Clearance: 56.8 mL/min (A) (by C-G formula based on SCr of 1.5 mg/dL (H)).    Allergies  Allergen Reactions   Latex Hives and Rash    Antimicrobials this admission: 4/1 CTX > 4/6 4/8 Zosyn >>   Thank you for allowing pharmacy to be a part of this patient's care.  Jeanella Cara, PharmD, [facility] Clinical Pharmacist Please see AMION for all Pharmacists' Contact Phone Numbers 08/17/2022, 9:33 AM

## 2022-08-17 NOTE — Progress Notes (Addendum)
NAME:  Kurt Blair, MRN:  [id number], DOB:  1946-02-18, LOS: 7 ADMISSION DATE:  08/22/2022, CONSULTATION DATE:  08/29/2022 REFERRING MD:  Eudelia Bunchardama - EM, CHIEF COMPLAINT:  cardiac arrest    History of Present Illness:  77 y/o M PMH diastolic HF, COPD, HTN, PTSD, etoh abuse presented to ED following witnessed arrest at home while walking to bathroom. With EMS -- CPR [id number], [phone]. It sounds like pulses were lost a third time just prior to arrival and pt required several rounds of ACLS, though duration is not clear.   Intubated in ED. Remained shock after ROSC, req NE and Epi. Woke and was purposeful in ER.   Per report, he was SOB preceding arrest for several days (?up to 2 wks).  Was seen by his PCP 3/25 with reports of shortness of breath at that time, but unchanged for 10 years, mild intermittent cough with LE edema.   Pertinent  Medical History  COPD Diastolic HF HTN  Etoh Abuse PTSD   Significant Hospital Events: Including procedures, antibiotic start and stop dates in addition to other pertinent events   4/1 admit to ICU following witnessed OOH arrest x2, and subsequent ED arrest, noted to have PE - given thrombolytic therapy and started on heparin gtt 4/4 CT Chest abdomen pelvis ordered for drop in hemoglobin, noted small volume hemoperitoneum in pelvis, left flank subcutaneous hematoma and posterior mediastinal hematoma. Right IJ hematoma. 4/5 transfused 1 unit PRBCs and 1 unit platelets. IVC filter placed by IR 4/6 increasing FiO2 requirements on PEEP of 8 4/7 TTE, preserved EF. Difficult to visualize RV 4/8 NG tube placed, confirmed in stomach  Interim History / Subjective:   Continued on PEEP of 10 Continues to be agitated overnight  Objective   Blood pressure 122/77, pulse (!) 59, temperature 99.5 F (37.5 C), temperature source Axillary, resp. rate (!) 26, weight 130.3 kg, SpO2 100 %. CVP:  [15 mmHg] 15 mmHg  Vent Mode: PRVC FiO2 (%):  [50 %-100 %] 90 % Set Rate:   [26 bmp] 26 bmp Vt Set:  [550 mL] 550 mL PEEP:  [10 cmH20] 10 cmH20 Plateau Pressure:  [29 cmH20] 29 cmH20   Intake/Output Summary (Last 24 hours) at 08/17/2022 1057 Last data filed at 08/17/2022 1032 Gross per 24 hour  Intake 3484.88 ml  Output 725 ml  Net 2759.88 ml   Filed Weights   08/15/22 0500 08/16/22 0530 08/17/22 0355  Weight: 129.2 kg 132.6 kg 130.3 kg   Examination: General: ill appearing HENT: normocephalic, ET tube in place.  Cardio: regular rate and rhythm. No murmurs, gallops, rubs Pulm: breathing comfortably on vent. Course breath sounds.  Abd: distended, hypoactive bowel sounds Skin: no rashes Extremities: cool, 1+ pitting edema Neuro: sedated. Opens eyes to commands  Assessment & Plan:  Witnessed OOH Cardiac Arrest, PEA Massive pulmonary embolism Left LE DVT - s/p thrombolytic therapy 4/1, IVC filter placed 4/5 - concern for persistent RHF and difficult to visualize on echocardiogram, w/ hemoglobin stable will start heparin injections - TTE yesterday difficulty to visualize RV, but preserved LV function  Acute respiratory failure with hypoxemia and hypercarbia  COPD  CAP vs Aspiration PNA R>L  Temperature of 100.1 on tylenol. WBC up from 10 to 12. Completed 6 day course of ceftriaxone. New xray difficult to interpret if worsening opacity. W/ increasing WBC and elevated temperature on tylenol, will restart abx and follow sputum culture. Also likely component of hypervolemia contributing to respiratory status. - Continue mechanical ventilatory support -  PRVC with LTVV.  SBT if able - Continue Brovana, Pulmicort and Yuperi. Duobnebs PRN.  - Zosyn day 1 - Changed to propofol for sedation, continue IV fentanyl and oxycodone per tube - Wean opioids to try and improve respiratory status  Acute blood loss anemia Right IJ hematoma Posterior mediastinal Hematoma Hemoperitoneum, mild Hemoglobin stable at 9.9>9.1. Will restart anticoagulation today with concern for  persistent RH dysfunction - Monitor H/H - Transfuse for hemoglobin < 8g/dL - Continue TXA gauze if any bleeding from sites  Hx Diastolic HF, NYHA Class 1 Echocardiogram showed preserved EF. Suspect component of hypervolemia. Will add vasopressin to stabilize blood pressure and give lasix 20 mg today. Coox pending - Lasix 20 mg BID, monitor urine output and pressures - Hold home benicar, demadex, coreg in the setting of shock - Continue albumin to help with third spacing - Daily weights - Monitor urinary output - CVP and Coox pending  Shock Multifactorial obstructive. LVEF normal on TTE. Difficult to interpret RH function. - Continue levophed, will add vasopressin today - No loner on stress dose steroids - Restart abx today  Ileus NG tube placed, will start low intermittent suction today.  - Bowel regimen today of dulcolax suppository   AKI Hypokalemia ATN secondary to shock. Cr stable at 1.5, minimal urine output. Electrolytes stable.  -Trend BMP  Elevated LFT's NASH 2/2 shock liver. AST/ALT normalized. No INR today, but synthetic function. Bilirubin increased likely hepatic congestion from hypervolemia -Follow hepatic function in next few days  Pre-Diabetes  -SSI   Hypothyroidism -not on meds  -TSH 4.6 3/25   Rib Fractures - Nondisplaced to minimally displaced right anterior 2nd through 7th rib fractures. Similar anterior left side 3rd through 7th anterior rib fractures some at the costochondral junctions. - CPR related  Difficult situation and poor overall prognosis with attempting to manage his pulmonary embolism, hemorrhages, hypervolemia, and shock. Will consult palliative care today for further goals of care discussion. Guarded prognosis  Best Practice (right click and "Reselect all SmartList Selections" daily)  Diet/type: tubefeeds held with ileus DVT prophylaxis: SCD, heparin starting back today GI prophylaxis: PPI Lines: Central line. Foley:  Yes, and it  is still needed Code Status:  DNR Last date of multidisciplinary goals of care discussion: DNR   Labs   CBC: Recent Labs  Lab 08/13/22 1445 08/14/22 0445 08/14/22 1823 08/15/22 0353 08/16/22 0340 08/17/22 0500  WBC 8.5 7.5  --  6.6 10.2 12.8*  HGB 9.0* 8.2* 8.8* 9.1* 9.9* 9.1*  HCT 24.8* 22.7* 24.3* 25.4* 28.1* 25.9*  MCV 84.4 84.4  --  85.5 87.3 85.2  PLT 110* 107*  --  117* 169 186    Basic Metabolic Panel: Recent Labs  Lab 08/13/22 0415 08/14/22 0445 08/15/22 0353 08/15/22 1533 08/16/22 0340 08/17/22 0500  NA 136 140 140 140 143 142  K 4.0 3.3* 3.1* 3.1* 4.1 4.0  CL 98 104 104 107 108 108  CO2 24 25 26 23 24 23   GLUCOSE 130* 118* 102* 99 142* 97  BUN 48* 48* 41* 40* 39* 35*  CREATININE 2.60* 1.96* 1.68* 1.53* 1.68* 1.50*  CALCIUM 7.4* 7.9* 8.1* 8.1* 8.2* 8.2*  MG 1.5* 2.1  --   --   --  2.2  PHOS 3.2 2.9  --   --   --  3.2   GFR: Estimated Creatinine Clearance: 56.8 mL/min (A) (by C-G formula based on SCr of 1.5 mg/dL (H)). Recent Labs  Lab 09/04/2022 1402 08/11/22 0445 08/14/22 0445  08/15/22 0353 08/16/22 0340 08/17/22 0500  WBC  --    < > 7.5 6.6 10.2 12.8*  LATICACIDVEN 2.2*  --   --   --   --   --    < > = values in this interval not displayed.    Liver Function Tests: Recent Labs  Lab 08/11/22 0445 08/16/22 0907 08/17/22 0500  AST 50* 34 22  ALT 33 18 15  ALKPHOS 52 61 69  BILITOT 2.2* 1.3* 3.3*  PROT 4.9* 5.5* 5.8*  ALBUMIN 2.3* 2.4* 2.8*   No results for input(s): "LIPASE", "AMYLASE" in the last 168 hours. No results for input(s): "AMMONIA" in the last 168 hours.  ABG    Component Value Date/Time   PHART 7.440 08/11/2022 1345   PCO2ART 41.8 08/11/2022 1345   PO2ART 143 (H) 08/11/2022 1345   HCO3 28.0 08/11/2022 1345   TCO2 29 08/11/2022 1345   ACIDBASEDEF 3.0 (H) 09/05/2022 0622   O2SAT 71.1 08/16/2022 1100     Coagulation Profile: Recent Labs  Lab 08/29/2022 1401  INR 1.6*    Cardiac Enzymes: No results for input(s):  "CKTOTAL", "CKMB", "CKMBINDEX", "TROPONINI" in the last 168 hours.  HbA1C: Hgb A1c MFr Bld  Date/Time Value Ref Range Status  08/03/2022 12:13 PM 6.4 4.6 - 6.5 % Final    Comment:    Glycemic Control Guidelines for People with Diabetes:Non Diabetic:  <6%Goal of Therapy: <7%Additional Action Suggested:  >8%   02/12/2022 02:31 PM 6.2 4.6 - 6.5 % Final    Comment:    Glycemic Control Guidelines for People with Diabetes:Non Diabetic:  <6%Goal of Therapy: <7%Additional Action Suggested:  >8%     CBG: Recent Labs  Lab 08/16/22 1501 08/16/22 1906 08/16/22 2303 08/17/22 0308 08/17/22 0711  GLUCAP 91 91 100* 102* 123*    Vasili Cherylin Waguespack DO  Internal Medicine Resident PGY-3 [place]  Pager: [phone]

## 2022-08-18 ENCOUNTER — Inpatient Hospital Stay (HOSPITAL_COMMUNITY): Payer: Medicare Other

## 2022-08-18 DIAGNOSIS — Z515 Encounter for palliative care: Secondary | ICD-10-CM

## 2022-08-18 DIAGNOSIS — Z7189 Other specified counseling: Secondary | ICD-10-CM

## 2022-08-18 DIAGNOSIS — I469 Cardiac arrest, cause unspecified: Secondary | ICD-10-CM | POA: Diagnosis not present

## 2022-08-18 LAB — POCT I-STAT 7, (LYTES, BLD GAS, ICA,H+H)
Acid-base deficit: 2 mmol/L (ref 0.0–2.0)
Bicarbonate: 23.9 mmol/L (ref 20.0–28.0)
Calcium, Ion: 1.18 mmol/L (ref 1.15–1.40)
HCT: 26 % — ABNORMAL LOW (ref 39.0–52.0)
Hemoglobin: 8.8 g/dL — ABNORMAL LOW (ref 13.0–17.0)
O2 Saturation: 96 %
Patient temperature: 100.3
Potassium: 4.5 mmol/L (ref 3.5–5.1)
Sodium: 146 mmol/L — ABNORMAL HIGH (ref 135–145)
TCO2: 25 mmol/L (ref 22–32)
pCO2 arterial: 48.8 mmHg — ABNORMAL HIGH (ref 32–48)
pH, Arterial: 7.303 — ABNORMAL LOW (ref 7.35–7.45)
pO2, Arterial: 92 mmHg (ref 83–108)

## 2022-08-18 LAB — CBC WITH DIFFERENTIAL/PLATELET
Abs Immature Granulocytes: 0.1 10*3/uL — ABNORMAL HIGH (ref 0.00–0.07)
Basophils Absolute: 0 10*3/uL (ref 0.0–0.1)
Basophils Relative: 0 %
Eosinophils Absolute: 0.3 10*3/uL (ref 0.0–0.5)
Eosinophils Relative: 3 %
HCT: 24.3 % — ABNORMAL LOW (ref 39.0–52.0)
Hemoglobin: 8.5 g/dL — ABNORMAL LOW (ref 13.0–17.0)
Immature Granulocytes: 1 %
Lymphocytes Relative: 7 %
Lymphs Abs: 0.8 10*3/uL (ref 0.7–4.0)
MCH: 30.2 pg (ref 26.0–34.0)
MCHC: 35 g/dL (ref 30.0–36.0)
MCV: 86.5 fL (ref 80.0–100.0)
Monocytes Absolute: 0.9 10*3/uL (ref 0.1–1.0)
Monocytes Relative: 8 %
Neutro Abs: 9.5 10*3/uL — ABNORMAL HIGH (ref 1.7–7.7)
Neutrophils Relative %: 81 %
Platelets: 209 10*3/uL (ref 150–400)
RBC: 2.81 MIL/uL — ABNORMAL LOW (ref 4.22–5.81)
RDW: 14.8 % (ref 11.5–15.5)
WBC: 11.6 10*3/uL — ABNORMAL HIGH (ref 4.0–10.5)
nRBC: 0.2 % (ref 0.0–0.2)

## 2022-08-18 LAB — COMPREHENSIVE METABOLIC PANEL
ALT: 12 U/L (ref 0–44)
AST: 18 U/L (ref 15–41)
Albumin: 3.3 g/dL — ABNORMAL LOW (ref 3.5–5.0)
Alkaline Phosphatase: 56 U/L (ref 38–126)
Anion gap: 11 (ref 5–15)
BUN: 41 mg/dL — ABNORMAL HIGH (ref 8–23)
CO2: 22 mmol/L (ref 22–32)
Calcium: 8.5 mg/dL — ABNORMAL LOW (ref 8.9–10.3)
Chloride: 109 mmol/L (ref 98–111)
Creatinine, Ser: 1.94 mg/dL — ABNORMAL HIGH (ref 0.61–1.24)
GFR, Estimated: 35 mL/min — ABNORMAL LOW (ref >60)
Glucose, Bld: 87 mg/dL (ref 70–99)
Potassium: 4.3 mmol/L (ref 3.5–5.1)
Sodium: 142 mmol/L (ref 135–145)
Total Bilirubin: 5.7 mg/dL — ABNORMAL HIGH (ref 0.3–1.2)
Total Protein: 6.2 g/dL — ABNORMAL LOW (ref 6.5–8.1)

## 2022-08-18 LAB — GLUCOSE, CAPILLARY
Glucose-Capillary: 74 mg/dL (ref 70–99)
Glucose-Capillary: 73 mg/dL (ref 70–99)
Glucose-Capillary: 73 mg/dL (ref 70–99)
Glucose-Capillary: 77 mg/dL (ref 70–99)
Glucose-Capillary: 81 mg/dL (ref 70–99)
Glucose-Capillary: 79 mg/dL (ref 70–99)

## 2022-08-18 LAB — BASIC METABOLIC PANEL
Anion gap: 14 (ref 5–15)
BUN: 46 mg/dL — ABNORMAL HIGH (ref 8–23)
CO2: 22 mmol/L (ref 22–32)
Calcium: 8.6 mg/dL — ABNORMAL LOW (ref 8.9–10.3)
Chloride: 109 mmol/L (ref 98–111)
Creatinine, Ser: 2.26 mg/dL — ABNORMAL HIGH (ref 0.61–1.24)
GFR, Estimated: 29 mL/min — ABNORMAL LOW (ref >60)
Glucose, Bld: 78 mg/dL (ref 70–99)
Potassium: 4.3 mmol/L (ref 3.5–5.1)
Sodium: 145 mmol/L (ref 135–145)

## 2022-08-18 LAB — DIC (DISSEMINATED INTRAVASCULAR COAGULATION)PANEL
D-Dimer, Quant: 4.37 ug/mL-FEU — ABNORMAL HIGH (ref 0.00–0.50)
Fibrinogen: 724 mg/dL — ABNORMAL HIGH (ref 210–475)
INR: 1.4 — ABNORMAL HIGH (ref 0.8–1.2)
Platelets: 200 10*3/uL (ref 150–400)
Prothrombin Time: 17.1 seconds — ABNORMAL HIGH (ref 11.4–15.2)
Smear Review: NONE SEEN
aPTT: 44 seconds — ABNORMAL HIGH (ref 24–36)

## 2022-08-18 LAB — MAGNESIUM: Magnesium: 2.3 mg/dL (ref 1.7–2.4)

## 2022-08-18 LAB — TRIGLYCERIDES: Triglycerides: 150 mg/dL — ABNORMAL HIGH (ref <150)

## 2022-08-18 LAB — TECHNOLOGIST SMEAR REVIEW

## 2022-08-18 LAB — COOXEMETRY PANEL
Carboxyhemoglobin: 2.2 % — ABNORMAL HIGH (ref 0.5–1.5)
Methemoglobin: <0.7 % (ref 0.0–1.5)
O2 Saturation: 66.3 %
Total hemoglobin: 8.6 g/dL — ABNORMAL LOW (ref 12.0–16.0)

## 2022-08-18 LAB — CULTURE, RESPIRATORY W GRAM STAIN

## 2022-08-18 MED ORDER — FUROSEMIDE 10 MG/ML IJ SOLN
40.0000 mg | Freq: Three times a day (TID) | INTRAMUSCULAR | Status: DC
  Administered 2022-08-18 – 2022-08-19 (×4): 40 mg via INTRAVENOUS
  Filled 2022-08-18 (×4): qty 4

## 2022-08-18 MED ORDER — PROCHLORPERAZINE EDISYLATE 10 MG/2ML IJ SOLN
10.0000 mg | INTRAMUSCULAR | Status: DC | PRN
  Administered 2022-08-18: 10 mg via INTRAVENOUS
  Filled 2022-08-18 (×3): qty 2

## 2022-08-18 MED ORDER — NEOSTIGMINE METHYLSULFATE 10 MG/10ML IV SOLN
0.5000 mg | Freq: Four times a day (QID) | INTRAVENOUS | Status: AC
  Administered 2022-08-18 – 2022-08-20 (×8): 0.5 mg via SUBCUTANEOUS
  Filled 2022-08-18 (×8): qty 0.5

## 2022-08-18 NOTE — Progress Notes (Signed)
NAME:  Kurt Blair, MRN:  [id number], DOB:  [birth year], LOS: 8 ADMISSION DATE:  08/27/2022, CONSULTATION DATE:  08/21/2022 REFERRING MD:  Kurt Blair - EM, CHIEF COMPLAINT:  cardiac arrest    History of Present Illness:  77 y/o M PMH diastolic HF, COPD, HTN, PTSD, etoh abuse presented to ED following witnessed arrest at home while walking to bathroom. With EMS -- CPR [phone], [phone]. It sounds like pulses were lost a third time just prior to arrival and pt required several rounds of ACLS, though duration is not clear.   Intubated in ED. Remained shock after ROSC, req NE and Epi. Woke and was purposeful in ER.   Per report, he was SOB preceding arrest for several days (?up to 2 wks).  Was seen by his PCP 3/25 with reports of shortness of breath at that time, but unchanged for 10 years, mild intermittent cough with LE edema.   Pertinent  Medical History  COPD Diastolic HF HTN  Etoh Abuse PTSD   Significant Hospital Events: Including procedures, antibiotic start and stop dates in addition to other pertinent events   4/1 admit to ICU following witnessed OOH arrest x2, and subsequent ED arrest, noted to have PE - given thrombolytic therapy and started on heparin gtt 4/4 CT Chest abdomen pelvis ordered for drop in hemoglobin, noted small volume hemoperitoneum in pelvis, left flank subcutaneous hematoma and posterior mediastinal hematoma. Right IJ hematoma. 4/5 transfused 1 unit PRBCs and 1 unit platelets. IVC filter placed by IR 4/6 increasing FiO2 requirements on PEEP of 8 4/7 TTE, preserved EF. Difficult to visualize RV 4/8 NG tube placed, confirmed in stomach  Interim History / Subjective:   Continued to have emesis overnight with conjunctival hemorrhage in the left eye. Passing dark clots in his ET tube.   Objective   Blood pressure (!) 99/59, pulse 84, temperature (!) 100.7 F (38.2 C), temperature source Axillary, resp. rate (!) 23, weight 130.3 kg, SpO2 97 %. CVP:  [12 mmHg-15  mmHg] 15 mmHg  Vent Mode: PRVC FiO2 (%):  [70 %-90 %] 70 % Set Rate:  [26 bmp] 26 bmp Vt Set:  [550 mL] 550 mL PEEP:  [10 cmH20] 10 cmH20 Plateau Pressure:  [26 cmH20-28 cmH20] 26 cmH20   Intake/Output Summary (Last 24 hours) at 08/18/2022 0849 Last data filed at 08/18/2022 0600 Gross per 24 hour  Intake 1310.67 ml  Output 1050 ml  Net 260.67 ml   Filed Weights   08/15/22 0500 08/16/22 0530 08/17/22 0355  Weight: 129.2 kg 132.6 kg 130.3 kg   Examination: General: ill appearing HENT: normocephalic, ET tube in place. Left conjunctival hemorrhage Cardio: regular rate and rhythm. No murmurs, gallops, rubs Pulm: breathing comfortably on vent. Course breath sounds.  Abd: distended, hypoactive bowel sounds Skin: no rashes Extremities: warm 1-2 + pitting edema Neuro: sedated  Assessment & Plan:  Witnessed OOH Cardiac Arrest, PEA Massive pulmonary embolism Left LE DVT - s/p thrombolytic therapy 4/1, IVC filter placed 4/5 - will see how he does with DVT prophylaxis dosing of heparin, if no concern for bleed will increase dosing - passing dark clots from ET tube, likely from thrombolytics.  - continued concern for RV dysfunction.   Acute respiratory failure with hypoxemia and hypercarbia  COPD  CAP vs Aspiration PNA R>L  Febrile to 100.4 and 100.7 F overnight Respiratory failure likely multifactorial with hypervolemia and suspected pneumonia. Temperature of 100.1 on tylenol. WBC stable at 11.6. Started on zosyn yesterday with concern for pneumonia.  MRSA negative.  - continue mechanical ventilatory support - PRVC with LTVV.  SBT if able - continue Brovana, Pulmicort and Yuperi. Duobnebs PRN.  - zosyn day 2, narrow based on respiratory cultures. If worsening clinical status can check blood cultures - diuresing as per below   Hx Diastolic HF, NYHA Class 1 CVP of 15 this morning. CVP pending. Significantly volume up from weights and physical exam. Will increase lasix to 40 mg q8h.  Extremities warm today, low suspicion for low output state. Can add inotrope if need be - lasix 40 mg q8h, monitor CVP and coox - afternoon bmp/mag - hold GDMT in setting of shock - daily weights - monitor urinary output  Acute blood loss anemia Right IJ hematoma Posterior mediastinal Hematoma Hemoperitoneum, mild Hemoglobin 9.1>8.5. Does have left conjunctival hemorrhage. Platelets and smear normal. DIC panel not concerning for acute DIC. If persistent anemia can check LDH for hemolysis with elevated bilirubin - monitor H/H - transfuse for hemoglobin < 8g/dL - continue TXA gauze if any bleeding from sites  Shock Multifactorial obstructive and likely component of RHF.  - continue levophed, vasopressin with increasing diuretics - continue abx  Ileus NG tube in place, continued to have bilious output. Abdomen still distended. If no improvement over next day can repeat imaging tomorrow. No BM can give enema - dulcolax suppository  - hold tube foods - consider enema and repeat imaging  Non-oliguric AKI ATN secondary to shock. Continue elevation likely in setting of hypervolemia. Having urinary output, electrolytes stable.  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 Bilirubin up to 5.7. Will differentiate tomorrow. AST/ALT normalized. Blood smear pending, no schistocytes on DIC panel.  -hepatic function panel tomorrow  Pre-Diabetes  -SSI   Hypothyroidism -not on meds  -TSH 4.6 3/25   Rib Fractures - Nondisplaced to minimally displaced right anterior 2nd through 7th rib fractures. Similar anterior left side 3rd through 7th anterior rib fractures some at the costochondral junctions. - CPR related  Spoke with patient's wife at bedside yesterday and discussed how ill Mr. Kurt Blair is. Consulted palliative care today and appreciate their help in his care. Guarded prognosis  Best Practice (right click and "Reselect all SmartList  Selections" daily)  Diet/type: tubefeeds held with ileus DVT prophylaxis: heparin GI prophylaxis: PPI Lines: Central line. Foley:  Yes, and it is still needed Code Status:  DNR Last date of multidisciplinary goals of care discussion: Palliative consult, updated wife yesterday.    Labs   CBC: Recent Labs  Lab 08/14/22 0445 08/14/22 1823 08/15/22 0353 08/16/22 0340 08/17/22 0500 08/18/22 0208 08/18/22 0211  WBC 7.5  --  6.6 10.2 12.8* 11.6*  --   NEUTROABS  --   --   --   --   --  9.5*  --   HGB 8.2*   < > 9.1* 9.9* 9.1* 8.5* 8.8*  HCT 22.7*   < > 25.4* 28.1* 25.9* 24.3* 26.0*  MCV 84.4  --  85.5 87.3 85.2 86.5  --   PLT 107*  --  117* 169 186 200  209  --    < > = values in this interval not displayed.    Basic Metabolic Panel: Recent Labs  Lab 08/13/22 0415 08/14/22 0445 08/15/22 0353 08/15/22 1533 08/16/22 0340 08/17/22 0500 08/18/22 0208 08/18/22 0211  NA 136 140 140 140 143 142 142 146*  K 4.0 3.3* 3.1* 3.1* 4.1 4.0 4.3 4.5  CL 98  104 104 107 108 108 109  --   CO2 24 25 26 23 24 23 22   --   GLUCOSE 130* 118* 102* 99 142* 97 87  --   BUN 48* 48* 41* 40* 39* 35* 41*  --   CREATININE 2.60* 1.96* 1.68* 1.53* 1.68* 1.50* 1.94*  --   CALCIUM 7.4* 7.9* 8.1* 8.1* 8.2* 8.2* 8.5*  --   MG 1.5* 2.1  --   --   --  2.2  --   --   PHOS 3.2 2.9  --   --   --  3.2  --   --    GFR: Estimated Creatinine Clearance: 43.9 mL/min (A) (by C-G formula based on SCr of 1.94 mg/dL (H)). Recent Labs  Lab 08/15/22 0353 08/16/22 0340 08/17/22 0500 08/18/22 0208  WBC 6.6 10.2 12.8* 11.6*    Liver Function Tests: Recent Labs  Lab 08/16/22 0907 08/17/22 0500 08/18/22 0208  AST 34 22 18  ALT 18 15 12   ALKPHOS 61 69 56  BILITOT 1.3* 3.3* 5.7*  PROT 5.5* 5.8* 6.2*  ALBUMIN 2.4* 2.8* 3.3*   No results for input(s): "LIPASE", "AMYLASE" in the last 168 hours. No results for input(s): "AMMONIA" in the last 168 hours.  ABG    Component Value Date/Time   PHART 7.303 (L)  08/18/2022 0211   PCO2ART 48.8 (H) 08/18/2022 0211   PO2ART 92 08/18/2022 0211   HCO3 23.9 08/18/2022 0211   TCO2 25 08/18/2022 0211   ACIDBASEDEF 2.0 08/18/2022 0211   O2SAT 96 08/18/2022 0211     Coagulation Profile: Recent Labs  Lab 08/18/22 0208  INR 1.4*    Cardiac Enzymes: No results for input(s): "CKTOTAL", "CKMB", "CKMBINDEX", "TROPONINI" in the last 168 hours.  HbA1C: Hgb A1c MFr Bld  Date/Time Value Ref Range Status  08/03/2022 12:13 PM 6.4 4.6 - 6.5 % Final    Comment:    Glycemic Control Guidelines for People with Diabetes:Non Diabetic:  <6%Goal of Therapy: <7%Additional Action Suggested:  >8%   02/12/2022 02:31 PM 6.2 4.6 - 6.5 % Final    Comment:    Glycemic Control Guidelines for People with Diabetes:Non Diabetic:  <6%Goal of Therapy: <7%Additional Action Suggested:  >8%     CBG: Recent Labs  Lab 08/17/22 1502 08/17/22 1903 08/17/22 2318 08/18/22 0343 08/18/22 0716  GLUCAP 87 83 76 74 73    Vasili Roniyah Llorens DO  Internal Medicine Resident PGY-3 [place]  Pager: [phone]

## 2022-08-18 NOTE — Progress Notes (Signed)
Nutrition Follow-up  DOCUMENTATION CODES:  Obesity unspecified  INTERVENTION:  When ileus resolves recommend restarting enteral nutrition Goal is the following:  Vital 1.5 at 60 ml/h (1440 ml per day) Prosource TF20 60 ml BID Provides 2320 kcal, 137 gm protein, 1100 ml free water daily If pt's ileus is prolonged (longer than 48 more hours) and enteral nutrition is contraindicated, would recommend starting TPN by 4/11 at the latest  NUTRITION DIAGNOSIS:   Inadequate oral intake related to inability to eat as evidenced by NPO status. - remains applicable  GOAL:   Patient will meet greater than or equal to 90% of their needs - not progressing, not tolerating TF, on LIWS  MONITOR:   TF tolerance, Vent status, Labs, I & O's, Weight trends  REASON FOR ASSESSMENT:   Consult Assessment of nutrition requirement/status  ASSESSMENT:   Pt with hx of HLD, HTN, COPD, CHF, and NASH presented to ED s/p cardiac arrest. Received several rounds of ACLS.  4/1 - intubated  4/3 - cortrak tube placed (gastric), trickle feeds initiated 4/4 - TF held for hematoma discovery 4/5 - TF restarted and advanced to goal  4/7 - TF held, NGT placed to suction  Pt resting in bed at the time of assessment, remains intubated on ventilator support. NG was placed on 4/7 and hooked to LIWS after pt had emesis. Pt continuing to have emesis even with decompression per RN.   Reviewed XR and side-port appears to be near the GE junction. Discussed with MD, RN to advance tube 5-6 cm to determine if it helps with decompression.   Pt still requiring some pressor support. Has significant edema and fluid retention. May need CRRT.  Noted that family meeting is planned for this afternoon.   MV: 15.7 L/min Temp (24hrs), Avg:100.6 F (38.1 C), Min:100.2 F (37.9 C), Max:101.1 F (38.4 C)  Propofol: 23.9 ml/hr (631 kcal/d)   Intake/Output Summary (Last 24 hours) at 08/18/2022 1125 Last data filed at 08/18/2022  0600 Gross per 24 hour  Intake 1132.81 ml  Output 1000 ml  Net 132.81 ml  Net IO Since Admission: 12,785.05 mL [08/18/22 1125]   Nutritionally Relevant Medications: Scheduled Meds:  docusate  100 mg Per Tube BID   furosemide  20 mg Intravenous BID   insulin aspart  0-15 Units Subcutaneous Q4H   pantoprazole IV  40 mg Intravenous QHS   polyethylene glycol  17 g Per Tube BID   senna  1 tablet Per Tube BID   Continuous Infusions:  feeding supplement (VITAL 1.5 CAL) Stopped (08/17/22 0500)   norepinephrine (LEVOPHED) Adult infusion 13 mcg/min (08/18/22 0600)   piperacillin-tazobactam (ZOSYN)  IV 3.375 g (08/18/22 0603)   propofol (DIPRIVAN) infusion 30 mcg/kg/min (08/18/22 0604)   vasopressin     PRN Meds: bisacodyl, ondansetron, prochlorperazine  Labs Reviewed: Na 146 BUN 41, creatinine 1.94 CBG ranges from 73-123 mg/dL over the last 24 hours HgbA1c 6.4% (3/25)  NUTRITION - FOCUSED PHYSICAL EXAM: Flowsheet Row Most Recent Value  Orbital Region No depletion  Upper Arm Region No depletion  Thoracic and Lumbar Region No depletion  Buccal Region No depletion  Temple Region Mild depletion  Clavicle Bone Region No depletion  Clavicle and Acromion Bone Region Mild depletion  Scapular Bone Region No depletion  Dorsal Hand Unable to assess  [mittens]  Patellar Region No depletion  Anterior Thigh Region No depletion  Posterior Calf Region No depletion  Edema (RD Assessment) Moderate  [generalized, BLE]  Hair Reviewed  Eyes Reviewed  Mouth Reviewed  Skin Reviewed  Nails Reviewed   Diet Order:   Diet Order             Diet NPO time specified  Diet effective now                   EDUCATION NEEDS:  Not appropriate for education at this time  Skin:  Skin Assessment: Reviewed RN Assessment  Last BM:  4/7 - type 7  Height:  Ht Readings from Last 1 Encounters:  08/03/22 5\' 10"  (1.778 m)    Weight:  Wt Readings from Last 1 Encounters:  08/17/22 130.3 kg     Ideal Body Weight:  75.5 kg  BMI:  Body mass index is 41.22 kg/m.  Estimated Nutritional Needs:  Kcal:  2200-2500 kcal/d Protein:  120-150g/d Fluid:  2L/d    Greig Castilla, RD, LDN Clinical Dietitian RD pager # available in [facility]  After hours/weekend pager # available in [facility]

## 2022-08-18 NOTE — IPAL (Addendum)
  Interdisciplinary Goals of Care Family Meeting   Date carried out: 08/18/2022  Location of the meeting: Conference room  Member's involved: Physician, Bedside Registered Nurse, Social Worker, and Family Member or next of kin  Durable Power of Attorney or Environmental health practitioner: Mr. Sorn's wife, Valentino Matuszek    Discussion: We discussed goals of care for Kurt Blair including his prior functional status, medical status and leading into his hospitalization and into today. Mr. Sweda and Elease Hashimoto have been together for over 50 years. Elease Hashimoto mentions how Haydar was someone who always liked to stay active and did not prefer to be in bed. She noticed that he was needing to sleep in a recliner a week or two prior to his hospitalization, but he wouldn't share why.   We discussed the severity of his disease course including multi-organ failure and with his already prolonged hospital day, further treatments like at tracheostomy and dialysis would lead to further debilitation. Elease Hashimoto does not believe Lord would want that and would prefer to have a higher quality of life without suffering. She would like to discuss this with Marcy Salvo and her son's, but believes he would like to be kept comfortable over aggressively invasive measures. Will plan to discuss this with Elease Hashimoto and the family further tomorrow. Appreciate palliative care's assistance.   Code status:   Code Status: DNR   Disposition: Continue current acute care but will have further conversations with Felice's family tomorrow to discuss transitioning to comfort care.   Time spent for the meeting: 45 minutes  Thalia Bloodgood DO  Internal Medicine Resident PGY-3 [place]  Pager: [phone]

## 2022-08-18 NOTE — Progress Notes (Addendum)
eLink Physician-Brief Progress Note Patient Name: Kurt Blair DOB: 05-Jul-1945 MRN: [id number]   Date of Service  08/18/2022  HPI/Events of Note  77 yo M with suspected ileus vs bowel obstruction. Has NG to LW Suction. Zofran in place. N/V despite this. qTC 470  eICU Interventions  Add promethazine.   Patient had a bout of vomiting earlier but now is having copious  clot-like contents coming out of the ET tube.  New subconjunctival hemorrhage in the left medial inferior.  Will obtain a DIC panel with morning labs.  Hemoglobin was stable on last check.  Given lack of active bleeding, hold on TXA for now.  Responded appropriately to bag lavage.  If this happens again may need to assess for bronchoscopy.  Petechiae on lower extremities.  Various hematomas.  Arterial line is being finicky, cuff pressures seem more appropriate.  Intervention Category Minor Interventions: Routine modifications to care plan (e.g. PRN medications for pain, fever)  Lauralee Waters 08/18/2022, 12:34 AM

## 2022-08-18 NOTE — Consult Note (Addendum)
Palliative Medicine Inpatient Consult Note  Consulting Provider:  Belva Agee, MD   Reason for consult:  Massive PE complicated by hematomas  08/18/2022  HPI:  Per intake H&P --> 79M with history of diastolic HF, COPD, HTN, PTSD and alcohol abuse who is admitted for cardiac arrest with subsequent respiratory failure on mechanical ventilation and complicated by multiple hematomas leading to hemorrhagic shock.   The Palliative medicine team has been asked to get involved to support additional goals of care conversations in the setting of acute illness.  Clinical Assessment/Goals of Care:  *Please note that this is a verbal dictation therefore any spelling or grammatical errors are due to the "Dragon [employer] One" system interpretation.  I have reviewed medical records including EPIC notes, labs and imaging, received report from bedside RN, assessed the patient who is lying in bed able to open eyes.    I called and spoke to patients spouse, Elease Hashimoto to further discuss diagnosis prognosis, GOC, EOL wishes, disposition and options.   I introduced Palliative Medicine as specialized medical care for people living with serious illness. It focuses on providing relief from the symptoms and stress of a serious illness. The goal is to improve quality of life for both the patient and the family.  Medical History Review and Understanding:  A review of Erbie's PMH inclusive of ETOH abuse, COPD, heart failure (diastolic), and PTSD from [place].   Social History:  Reviewed that Kaiya and his wife have been married for many years. They share two children. 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 He is a man of faith and practices within the [facility] denomination.   Functional and Nutritional State:  Preceding cardiac arrest Shayne was fully functional in the home though his wife does state that a number of weeks ago he stopped using the bed  and started using the recliner. She feels this indicated difficulty breathing. Had been eating and drinking well leading up to hospitalization.   Advance Directives:  A detailed discussion was had today regarding advanced directives.    Code Status:  Concepts specific to code status, artifical feeding and hydration, continued IV antibiotics and rehospitalization was had.  The difference between a aggressive medical intervention path  and a palliative comfort care path for this patient at this time was had.   Seger is an established DNAR code status.  _____________________________________________  Patients spouse came in at 1:30 for a GOC meeting as below.  Discussion:  A meeting was held this afternoon with Dr. Gray Bernhardt, NP, and myself with patients wife, Dennie Bible and two brothers.   We reviewed the events leading to patients hospitalization. Discussed his chronic disease burden and present health state. A discussion as related to patients multi-system organ dysfunction was held.   Talked about the idea of CRRT and tracheostomy. Patients wife and brothers were very much against the idea of this. They all feel universally the patient is ready to be with God. He was very much a "get up and go" type of personality therefore would not want to be in a bed for the rest of his time. Elease Hashimoto would like time to speak with her two sons prior to additional decisions but she is aware of the trajectory continuing the present course and likelihood for poor outcomes.   We discussed the alternative of focusing on comfort. We talked about transition to comfort measures in house and what that would entail inclusive of medications to control  pain, dyspnea, agitation, nausea, itching, and hiccups.  We discussed stopping all uneccessary measures such as mechanical ventilatory, artificial nutrition, cardiac monitoring, blood draws, needle sticks, and frequent vital signs.   Utilized reflective  listening throughout our time together.   Additional conversations to take place tomorrow on a conference call.   Discussed the importance of continued conversation with family and their  medical providers regarding overall plan of care and treatment options, ensuring decisions are within the context of the patients values and GOCs.  Decision Maker: MCADOO,PATRICIA A (Spouse): [phone] (Home Phone)   SUMMARY OF RECOMMENDATIONS   DNAR   Open and honest conversations held in the setting of patients poor health state --> Discussed continuing present path with Trach and CRRT versus more of a comfort focused path. Patients wife is leaning towards liberation from vent and comfort but would like to speak with her two sons  Plan for conference call tomorrow with patients spouse and two sons  Ongoing PMT support --> My colleagues Lorinda Creed and Sarina Ser will resume care tomorrow  Code Status/Advance Care Planning:  DNAR   Palliative Prophylaxis:  Aspiration, Bowel Regimen, Delirium Protocol, Frequent Pain Assessment, Oral Care, Palliative Wound Care, and Turn Reposition  Additional Recommendations (Limitations, Scope, Preferences): Continue current care  Psycho-social/Spiritual:  Desire for further Chaplaincy support: Yes Additional Recommendations: Education on MSOD   Prognosis: Limited overall  Discharge Planning: Discharge plan is indeterminate.   Vitals:   08/18/22 0345 08/18/22 0409  BP:    Pulse: 72 98  Resp: (!) 22 (!) 26  Temp: (!) 100.4 F (38 C)   SpO2: (!) 88% 96%    Intake/Output Summary (Last 24 hours) at 08/18/2022 0710 Last data filed at 08/18/2022 0200 Gross per 24 hour  Intake 1144.94 ml  Output 700 ml  Net 444.94 ml   Last Weight  Most recent update: 08/17/2022  3:56 AM    Weight  130.3 kg (287 lb 4.2 oz)            Gen:  Chronically ill appearing AA M HEENT:ETT, OGT, dry mucous membranes CV: Regular rate and rhythm  PULM:  On Mechanical  vent ABD: Distended, hypoactive BS EXT: Generalized edema  Neuro: Somnolent, will open eyes  PPS: 10%   This conversation/these recommendations were discussed with patient primary care team, Dr. Heloise Beecham  Total Time: 1 Billing based on MDM: High ______________________________________________________ Lamarr Lulas [place] Palliative Medicine Team Team Cell Phone: [phone] Please utilize secure chat with additional questions, if there is no response within 30 minutes please call the above phone number  Palliative Medicine Team providers are available by phone from 7am to 7pm daily and can be reached through the team cell phone.  Should this patient require assistance outside of these hours, please call the patient's attending physician.

## 2022-08-18 NOTE — Progress Notes (Signed)
Pt's OGT advanced 6cm

## 2022-08-19 ENCOUNTER — Institutional Professional Consult (permissible substitution): Payer: Medicare Other | Admitting: Pulmonary Disease

## 2022-08-19 DIAGNOSIS — J9621 Acute and chronic respiratory failure with hypoxia: Secondary | ICD-10-CM | POA: Diagnosis not present

## 2022-08-19 DIAGNOSIS — I469 Cardiac arrest, cause unspecified: Secondary | ICD-10-CM | POA: Diagnosis not present

## 2022-08-19 DIAGNOSIS — Z515 Encounter for palliative care: Secondary | ICD-10-CM | POA: Diagnosis not present

## 2022-08-19 LAB — CULTURE, RESPIRATORY W GRAM STAIN

## 2022-08-19 LAB — CBC WITH DIFFERENTIAL/PLATELET
Abs Immature Granulocytes: 0.12 10*3/uL — ABNORMAL HIGH (ref 0.00–0.07)
Basophils Absolute: 0 10*3/uL (ref 0.0–0.1)
Basophils Relative: 0 %
Eosinophils Absolute: 0.4 10*3/uL (ref 0.0–0.5)
Eosinophils Relative: 3 %
HCT: 25.6 % — ABNORMAL LOW (ref 39.0–52.0)
Hemoglobin: 8.7 g/dL — ABNORMAL LOW (ref 13.0–17.0)
Immature Granulocytes: 1 %
Lymphocytes Relative: 6 %
Lymphs Abs: 0.8 10*3/uL (ref 0.7–4.0)
MCH: 29.7 pg (ref 26.0–34.0)
MCHC: 34 g/dL (ref 30.0–36.0)
MCV: 87.4 fL (ref 80.0–100.0)
Monocytes Absolute: 1 10*3/uL (ref 0.1–1.0)
Monocytes Relative: 8 %
Neutro Abs: 10.5 10*3/uL — ABNORMAL HIGH (ref 1.7–7.7)
Neutrophils Relative %: 82 %
Platelets: 269 10*3/uL (ref 150–400)
RBC: 2.93 MIL/uL — ABNORMAL LOW (ref 4.22–5.81)
RDW: 15.3 % (ref 11.5–15.5)
WBC: 12.7 10*3/uL — ABNORMAL HIGH (ref 4.0–10.5)
nRBC: 0.2 % (ref 0.0–0.2)

## 2022-08-19 LAB — BASIC METABOLIC PANEL
Anion gap: 13 (ref 5–15)
BUN: 52 mg/dL — ABNORMAL HIGH (ref 8–23)
CO2: 20 mmol/L — ABNORMAL LOW (ref 22–32)
Calcium: 8.2 mg/dL — ABNORMAL LOW (ref 8.9–10.3)
Chloride: 109 mmol/L (ref 98–111)
Creatinine, Ser: 2.66 mg/dL — ABNORMAL HIGH (ref 0.61–1.24)
GFR, Estimated: 24 mL/min — ABNORMAL LOW (ref >60)
Glucose, Bld: 80 mg/dL (ref 70–99)
Potassium: 4.3 mmol/L (ref 3.5–5.1)
Sodium: 142 mmol/L (ref 135–145)

## 2022-08-19 LAB — MAGNESIUM: Magnesium: 2.2 mg/dL (ref 1.7–2.4)

## 2022-08-19 LAB — COOXEMETRY PANEL
Carboxyhemoglobin: 4.4 % — ABNORMAL HIGH (ref 0.5–1.5)
Methemoglobin: <0.7 % (ref 0.0–1.5)
O2 Saturation: 77.4 %
Total hemoglobin: 8.7 g/dL — ABNORMAL LOW (ref 12.0–16.0)

## 2022-08-19 LAB — GLUCOSE, CAPILLARY
Glucose-Capillary: 74 mg/dL (ref 70–99)
Glucose-Capillary: 74 mg/dL (ref 70–99)
Glucose-Capillary: 73 mg/dL (ref 70–99)
Glucose-Capillary: 79 mg/dL (ref 70–99)
Glucose-Capillary: 84 mg/dL (ref 70–99)
Glucose-Capillary: 86 mg/dL (ref 70–99)

## 2022-08-19 NOTE — Progress Notes (Signed)
Patient ID: Kurt Blair, male   DOB: December 19, 1945, 77 y.o.   MRN: [id number]    Progress Note from the Palliative Medicine Team at [facility]   Patient Name: Kurt Blair        Date: 08/19/2022 DOB: 1945/08/21  Age: 77 y.o. MRN#: [id number] Attending Physician: Lorin Glass, MD Primary Care Physician: Etta Grandchild, MD Admit Date: 08/12/2022   Medical records reviewed, assessed patient    [phone] with history of diastolic HF, COPD, HTN, PTSD and alcohol abuse who is admitted for cardiac arrest with subsequent respiratory failure on mechanical ventilation, complicated hospitalization, associated poor prognosis   The Palliative medicine team has been consulted to help establish  goals of care in the setting of acute illness, initial consult 08/18/2022  Today patient remains intubated, requiring pressors, day 9 of the hospital stay  This NP assessed patient at the bedside as a follow up for palliative medicine needs and emotional support and to follow up with wife as discussed yesterday for possible family meeting today to include sons.   I spoke with patient's wife over the phone.  She verbalizes an understanding of the seriousness of her husband's current medical situation and the likely associated poor prognosis.  She understands having had talks with CCM decisions regarding trach and PEG and continued aggressive life-prolonging measures are pending.  She shares with me today that she has had a sense of knowing even prior to this admission, that her husband was failing.   She expresses concern for her son and the difficulty he is having with his father's serious illness situation.  She plans to speak with her son today and tells me she will get back with the treatment team regarding decisions moving forward.  Emotional support offered, therapeutic listening            Later in the morning Dr. Esmond Plants updated me that he spoke with patient's wife and plan is for no further  aggressive measures.  Will allow time for family to visit with intention of liberation from the vent on Friday, August 21, 2022.  Education offered today regarding  the importance of continued conversation with family and their  medical providers regarding overall plan of care and treatment options,  ensuring decisions are within the context of the patients values and GOCs.  Questions and concerns addressed   Discussed with Dr Karleen Dolphin and bedside RN  PMT will continue to support holistically and be available for family on Friday morning  Time: 50 minutes  Detailed review of medical records ( labs, imaging, vital signs), medically appropriate exam ( MS, skin, resp)   discussed with treatment team, counseling and education to patient, family, staff, documenting clinical information, medication management, coordination of care    Lorinda Creed NP  Palliative Medicine Team Team Phone # [phone] Pager [phone]

## 2022-08-19 NOTE — Progress Notes (Signed)
Brief Nutrition Note  Pt with cortrak tube in place but feeds are on hold due to intolerance. After family meeting, family has decided against escalating care. Does not want to pursue HD or tracheostomy. Planned for compassionate extubation on Friday 4/12. TF will not be restarted. RN to remove cortrak tube. TF orders dc. Will follow peripherally until pt is officially made comfort.  Greig Castilla, RD, LDN Clinical Dietitian RD pager # available in [facility]  After hours/weekend pager # available in [facility]

## 2022-08-19 NOTE — Progress Notes (Signed)
Spoke with patient's wife, Odilon Vandekamp over the phone. She has spoken with her sons and agree with no aggressive measures. Will plan to allow time for family to see the patient and remove ET tube on Friday 4/12. Will continue non-aggressive management and change RAAS goal to -2/-3.

## 2022-08-19 NOTE — Progress Notes (Addendum)
NAME:  Kurt Blair, MRN:  [id number], DOB:  February 17, 1946, LOS: 9 ADMISSION DATE:  09/08/2022, CONSULTATION DATE:  08/29/2022 REFERRING MD:  Eudelia Bunchardama - EM, CHIEF COMPLAINT:  cardiac arrest    History of Present Illness:  77 y/o M PMH diastolic HF, COPD, HTN, PTSD, etoh abuse presented to ED following witnessed arrest at home while walking to bathroom. With EMS -- CPR [phone], [phone]. It sounds like pulses were lost a third time just prior to arrival and pt required several rounds of ACLS, though duration is not clear.   Intubated in ED. Remained shock after ROSC, req NE and Epi. Woke and was purposeful in ER.   Per report, he was SOB preceding arrest for several days (?up to 2 wks).  Was seen by his PCP 3/25 with reports of shortness of breath at that time, but unchanged for 10 years, mild intermittent cough with LE edema.   Pertinent  Medical History  COPD Diastolic HF HTN  Etoh Abuse PTSD   Significant Hospital Events: Including procedures, antibiotic start and stop dates in addition to other pertinent events   4/1 admit to ICU following witnessed OOH arrest x2, and subsequent ED arrest, noted to have PE - given thrombolytic therapy and started on heparin gtt 4/4 CT Chest abdomen pelvis ordered for drop in hemoglobin, noted small volume hemoperitoneum in pelvis, left flank subcutaneous hematoma and posterior mediastinal hematoma. Right IJ hematoma. 4/5 transfused 1 unit PRBCs and 1 unit platelets. IVC filter placed by IR 4/6 increasing FiO2 requirements on PEEP of 8 4/7 TTE, preserved EF. Difficult to visualize RV 4/8 NG tube placed  Interim History / Subjective:   No acute events overnight. Abdomen continues to be distended. Minimal output in canister from NG. Family meeting yesterday, wife would like to discuss clinical status with son's but believes he would not want aggressive interventions and would want to be comfortable.   Objective   Blood pressure (!) 103/56, pulse 81,  temperature 98.6 F (37 C), temperature source Oral, resp. rate (!) 27, weight 133.8 kg, SpO2 97 %. CVP:  [14 mmHg-15 mmHg] 14 mmHg  Vent Mode: PRVC FiO2 (%):  [50 %-70 %] 50 % Set Rate:  [26 bmp] 26 bmp Vt Set:  [550 mL] 550 mL PEEP:  [10 cmH20-15 cmH20] 15 cmH20 Plateau Pressure:  [26 cmH20-29 cmH20] 29 cmH20   Intake/Output Summary (Last 24 hours) at 08/19/2022 0650 Last data filed at 08/19/2022 0500 Gross per 24 hour  Intake 1246.93 ml  Output 725 ml  Net 521.93 ml    Filed Weights   08/16/22 0530 08/17/22 0355 08/19/22 0500  Weight: 132.6 kg 130.3 kg 133.8 kg   Examination: General: ill appearing HENT: normocephalic, ET tube in place. Left conjunctival hemorrhage Cardio: regular rate and rhythm, no m/g/r Pulm: course breath sounds Abd: distended, tender, hypoactive bowel sounds Skin: no rashes Extremities: warm 1-2 + pitting edema.  Neuro: Opens eyes to verbal stimuli  Assessment & Plan:  Witnessed OOH Cardiac Arrest, PEA Massive pulmonary embolism Left LE DVT - s/p thrombolytic therapy 4/1, IVC filter placed 4/5 - Hgb stable on DVT prophylactic dose of heparin, will increase to theraputic dose today - hemodynamically stable  Acute respiratory failure with hypoxemia and hypercarbia  COPD  CAP vs Aspiration PNA R>L  Improvement of ventilatory requirements, now on FI02 of 50. Multifactorial from hypervolemia, CAP, and COPD.  - continue mechanical ventilatory support - PRVC with LTVV.  SBT if able - continue Brovana, Pulmicort and  Maryjean Ka. Duobnebs PRN.  - zosyn day 3, no growth on blood cultures 5 days and sputum cultures with few WBC.  - diuresing as per below  Hx Diastolic HF, NYHA Class 1 CVP improved this morning, coox pending. Continued to be hypervolemic. Minimal urinary output overnight with 40 lasix TID. Likely component of poor renal perfusion and significant abdominal pressures from ileus. Cr increased to 2.6. Extremities warm today, low suspicion for low  output state. Do not believe would be good candidate for CRRT. Ongoing goals of care conversations as per below.  - hold lasix with minimal urine output.  - electrolytes stable, Cr rising - hold GDMT in setting of shock - daily weights - monitor urinary output  Non-oliguric AKI ATN secondary to shock, now with worsening creatinine and urinary output with lasix and hypervolemia. Wife does not believe patient would want CRRT. Ongoing GOC conversation today.  - trend BMP  Acute blood loss anemia Right IJ hematoma Posterior mediastinal Hematoma Hemoperitoneum, mild Hemoglobin stable at 8.7. Low suspician bleeding with DVT prophylaxis dosing of heparin. Will increase dose today.  - monitor H/H - transfuse for hemoglobin < 8g/dL - continue TXA gauze if any bleeding from sites  Shock Multifactorial obstructive and likely component of RHF. MAP stable on levophed this morning not requiring vasopressin - continue levophed, vasopressin with increasing diuretics - continue abx  Ileus Continues to have abdominal distension, has not improved with NG tube. Will plan for CT abdomen today depending on GOC conversation with family - dulcolax suppository  - hold tube foods - consider enema and repeat imaging  Elevated LFT's NASH 2/2 shock liver and now likely hepatic congestion. AST/ALT resolved. Trend bilirubin -hepatic function panel tomorrow  Pre-Diabetes  -SSI   Hypothyroidism -not on meds  -TSH 4.6 3/25   Rib Fractures - Nondisplaced to minimally displaced right anterior 2nd through 7th rib fractures. Similar anterior left side 3rd through 7th anterior rib fractures some at the costochondral junctions. - CPR related  See IPAL note from yesterday but patient's wife will discuss clinical course with son's today. She believes he would want comfort care but would like their input  Best Practice (right click and "Reselect all SmartList Selections" daily)  Diet/type: tubefeeds held with  ileus DVT prophylaxis: heparin GI prophylaxis: PPI Lines: Central line. Foley:  Yes, and it is still needed Code Status:  DNR Last date of multidisciplinary goals of care discussion: IPAL 4/10, going conversations today  Labs   CBC: Recent Labs  Lab 08/15/22 0353 08/16/22 0340 08/17/22 0500 08/18/22 0208 08/18/22 0211 08/19/22 0326  WBC 6.6 10.2 12.8* 11.6*  --  12.7*  NEUTROABS  --   --   --  9.5*  --  10.5*  HGB 9.1* 9.9* 9.1* 8.5* 8.8* 8.7*  HCT 25.4* 28.1* 25.9* 24.3* 26.0* 25.6*  MCV 85.5 87.3 85.2 86.5  --  87.4  PLT 117* 169 186 200  209  --  269     Basic Metabolic Panel: Recent Labs  Lab 08/13/22 0415 08/14/22 0445 08/15/22 0353 08/16/22 0340 08/17/22 0500 08/18/22 0208 08/18/22 0211 08/18/22 1616 08/19/22 0326  NA 136 140   < > 143 142 142 146* 145 142  K 4.0 3.3*   < > 4.1 4.0 4.3 4.5 4.3 4.3  CL 98 104   < > 108 108 109  --  109 109  CO2 24 25   < > 24 23 22   --  22 20*  GLUCOSE 130* 118*   < >  142* 97 87  --  78 80  BUN 48* 48*   < > 39* 35* 41*  --  46* 52*  CREATININE 2.60* 1.96*   < > 1.68* 1.50* 1.94*  --  2.26* 2.66*  CALCIUM 7.4* 7.9*   < > 8.2* 8.2* 8.5*  --  8.6* 8.2*  MG 1.5* 2.1  --   --  2.2  --   --  2.3 2.2  PHOS 3.2 2.9  --   --  3.2  --   --   --   --    < > = values in this interval not displayed.    GFR: Estimated Creatinine Clearance: 32.5 mL/min (A) (by C-G formula based on SCr of 2.66 mg/dL (H)). Recent Labs  Lab 08/16/22 0340 08/17/22 0500 08/18/22 0208 08/19/22 0326  WBC 10.2 12.8* 11.6* 12.7*     Liver Function Tests: Recent Labs  Lab 08/16/22 0907 08/17/22 0500 08/18/22 0208  AST 34 22 18  ALT 18 15 12   ALKPHOS 61 69 56  BILITOT 1.3* 3.3* 5.7*  PROT 5.5* 5.8* 6.2*  ALBUMIN 2.4* 2.8* 3.3*    No results for input(s): "LIPASE", "AMYLASE" in the last 168 hours. No results for input(s): "AMMONIA" in the last 168 hours.  ABG    Component Value Date/Time   PHART 7.303 (L) 08/18/2022 0211   PCO2ART 48.8  (H) 08/18/2022 0211   PO2ART 92 08/18/2022 0211   HCO3 23.9 08/18/2022 0211   TCO2 25 08/18/2022 0211   ACIDBASEDEF 2.0 08/18/2022 0211   O2SAT 66.3 08/18/2022 0847     Coagulation Profile: Recent Labs  Lab 08/18/22 0208  INR 1.4*     Cardiac Enzymes: No results for input(s): "CKTOTAL", "CKMB", "CKMBINDEX", "TROPONINI" in the last 168 hours.  HbA1C: Hgb A1c MFr Bld  Date/Time Value Ref Range Status  08/03/2022 12:13 PM 6.4 4.6 - 6.5 % Final    Comment:    Glycemic Control Guidelines for People with Diabetes:Non Diabetic:  <6%Goal of Therapy: <7%Additional Action Suggested:  >8%   02/12/2022 02:31 PM 6.2 4.6 - 6.5 % Final    Comment:    Glycemic Control Guidelines for People with Diabetes:Non Diabetic:  <6%Goal of Therapy: <7%Additional Action Suggested:  >8%     CBG: Recent Labs  Lab 08/18/22 1119 08/18/22 1526 08/18/22 1905 08/18/22 2301 08/19/22 0309  GLUCAP 73 77 81 79 74     Vasili Tremain Rucinski DO  Internal Medicine Resident PGY-3 [place]  Pager: [phone]

## 2022-08-20 DIAGNOSIS — I469 Cardiac arrest, cause unspecified: Secondary | ICD-10-CM | POA: Diagnosis not present

## 2022-08-20 LAB — GLUCOSE, CAPILLARY
Glucose-Capillary: 92 mg/dL (ref 70–99)
Glucose-Capillary: 97 mg/dL (ref 70–99)
Glucose-Capillary: 102 mg/dL — ABNORMAL HIGH (ref 70–99)
Glucose-Capillary: 102 mg/dL — ABNORMAL HIGH (ref 70–99)

## 2022-08-20 LAB — CULTURE, RESPIRATORY W GRAM STAIN

## 2022-08-20 MED ORDER — FENTANYL 2500MCG IN NS 250ML (10MCG/ML) PREMIX INFUSION: 0.0000 ug/h | INTRAVENOUS | Status: DC

## 2022-08-20 MED ORDER — GLYCOPYRROLATE 0.2 MG/ML IJ SOLN: 0.2000 mg | INTRAMUSCULAR | Status: DC | PRN

## 2022-08-20 MED ORDER — ACETAMINOPHEN 650 MG RE SUPP: 650.0000 mg | Freq: Four times a day (QID) | RECTAL | Status: DC | PRN

## 2022-08-20 MED ORDER — ACETAMINOPHEN 160 MG/5ML PO SOLN
650.0000 mg | ORAL | Status: DC | PRN
  Administered 2022-08-20: 650 mg
  Filled 2022-08-20: qty 20.3

## 2022-08-20 MED ORDER — GLYCOPYRROLATE 1 MG PO TABS: 1.0000 mg | ORAL_TABLET | ORAL | Status: DC | PRN

## 2022-08-20 MED ORDER — SODIUM CHLORIDE 0.9 % IV SOLN: INTRAVENOUS | Status: DC

## 2022-08-20 MED ORDER — FENTANYL BOLUS VIA INFUSION: 100.0000 ug | INTRAVENOUS | Status: DC | PRN

## 2022-08-20 MED ORDER — ACETAMINOPHEN 325 MG PO TABS: 650.0000 mg | ORAL_TABLET | Freq: Four times a day (QID) | ORAL | Status: DC | PRN

## 2022-08-20 MED ORDER — POLYVINYL ALCOHOL 1.4 % OP SOLN: 1.0000 [drp] | Freq: Four times a day (QID) | OPHTHALMIC | Status: DC | PRN

## 2022-08-24 ENCOUNTER — Inpatient Hospital Stay: Payer: Medicare Other | Admitting: Hematology and Oncology

## 2022-08-24 ENCOUNTER — Ambulatory Visit: Payer: Medicare Other | Admitting: Internal Medicine

## 2022-08-24 ENCOUNTER — Inpatient Hospital Stay: Payer: Medicare Other

## 2022-08-24 LAB — CULTURE, RESPIRATORY W GRAM STAIN: Special Requests: NORMAL

## 2022-09-09 NOTE — Procedures (Signed)
Extubation Procedure Note  Patient Details:   Name: KENARD BOEDER DOB: 13-Sep-1945 MRN: [id number]   Airway Documentation:    Vent end date: 09/04/2022 Vent end time: 1723   Pt extubated per Withdrawal of Life Protocol  Carolan Shiver 08/23/2022, 5:23 PM

## 2022-09-09 NOTE — Progress Notes (Signed)
Wasted Fentanyl in med room. Witnessed by Darlis Loan, RN.

## 2022-09-09 NOTE — Progress Notes (Signed)
08/17/2022 Steadily deteriorating through day. Now on high dose levophed.  Suspect developing/worsening intestinal ischemia. Wife Elease Hashimoto at bedside wants to hold him as he passes. She is unable to stay the night due to her own health issues. Only way to achieve wish is to allow natural death this evening. We will increase pain medication to work of breathing and when additional family arrives will discontinue levophed and extubate to comfort.  Myrla Halsted MD PCCM

## 2022-09-09 NOTE — Death Summary Note (Signed)
DEATH SUMMARY   Patient Details  Name: Kurt Blair MRN: [id number] DOB: [birth year]  Admission/Discharge Information   Admit Date:  08/13/2022  Date of Death: Date of Death: 2022-08-23  Time of Death: Time of Death: 1726-09-19  Length of Stay: Sep 21, 2022  Referring Physician: Etta Grandchild, MD   Reason(s) for Hospitalization  Preliminary cause of death: intestinal ischemia  Secondary diagnoses: Witnessed OOH Cardiac Arrest, PEA Massive pulmonary embolism Left LE DVT Acute respiratory failure with hypoxemia and hypercarbia  COPD  CAP vs Aspiration PNA R>L  Acute blood loss anemia Right IJ hematoma Posterior mediastinal Hematoma Hemoperitoneum, mild Rib fractures Acute renal failure  Brief Hospital Course (including significant findings, care, treatment, and services provided and events leading to death)  77 y/o M PMH diastolic HF, COPD, HTN, PTSD, etoh abuse presented to ED following witnessed arrest at home while walking to bathroom. With EMS -- CPR [phone], [phone]. It sounds like pulses were lost a third time just prior to arrival and pt required several rounds of ACLS, though duration is not clear.    Intubated in ED. Remained shock after ROSC, req NE and Epi. Woke and was purposeful in ER.    Per report, he was SOB preceding arrest for several days (?up to 2 wks).  Was seen by his PCP 3/25 with reports of shortness of breath at that time, but unchanged for 10 years, mild intermittent cough with LE edema.  4/1 admit to ICU following witnessed OOH arrest x2, and subsequent ED arrest, noted to have PE - given thrombolytic therapy and started on heparin gtt 4/4 CT Chest abdomen pelvis ordered for drop in hemoglobin, noted small volume hemoperitoneum in pelvis, left flank subcutaneous hematoma and posterior mediastinal hematoma. Right IJ hematoma. 4/5 transfused 1 unit PRBCs and 1 unit platelets. IVC filter placed by IR 4/6 increasing FiO2 requirements on PEEP of 8 4/7 TTE, preserved  EF. Difficult to visualize RV 4/8 NG tube placed  Around 4/7 began having worsening abdominal distension and renal failure.  After discussion with family regarding dialysis, CT scans etc and quality of life leading to arrest, family admitted they do not think he would have wanted this level of care.  We all agreed to allow a peaceful passing with family at bedside.  Pertinent Labs and Studies  Significant Diagnostic Studies DG CHEST PORT 1 VIEW  Result Date: 08/18/2022 CLINICAL DATA:  Recent cardiac arrest, check endotracheal tube placement EXAM: PORTABLE CHEST 1 VIEW COMPARISON:  08/16/2022 FINDINGS: Cardiac shadow is stable. Endotracheal tube, gastric catheter and feeding catheter are noted in satisfactory position. Right jugular central line is seen. Lungs are hypoinflated with crowding of the vascular markings. Some improved aeration is noted particularly on the right when compared with the prior exam. No bony abnormality is noted. IMPRESSION: Tubes and lines as described. Poor inspiratory effort although improved aeration is noted when compared with the prior exam. Electronically Signed   By: Alcide Clever M.D.   On: 08/18/2022 02:29   DG Abd Portable 1V  Result Date: 08/17/2022 CLINICAL DATA:  Encounter for orogastric tube placement. EXAM: PORTABLE ABDOMEN - 1 VIEW COMPARISON:  Portable exam yesterday at 6:55 p.m. FINDINGS: Single-view centered on the left upper quadrant at 4:50 a.m., excludes much of the right side of the abdomen excluding the pelvis. Dobbhoff feeding tube again has its radiopaque tip in the distal body of stomach. Standard NGT has been inserted with the tip in the area of the proximal body of stomach. There  is enteric contrast in the proximal stomach. Dilatation of the transverse colon up to 7.5 cm continues to be seen. No dilated small bowel is evident. No supine evidence of free air. A caval filter is again shown. IMPRESSION: 1. Standard NGT has been inserted with the tip in the  area of the proximal body of the stomach. 2. Dobbhoff feeding tube again has its tip in the distal body of the stomach. 3. Persistent dilatation of the transverse colon. Electronically Signed   By: Almira Bar M.D.   On: 08/17/2022 07:50   DG Abd 1 View  Result Date: 08/16/2022 CLINICAL DATA:  Abdominal distension EXAM: ABDOMEN - 1 VIEW COMPARISON:  08/13/2022 FINDINGS: Scattered large and small bowel gas is noted. Some prominence of the colon is noted increased when compared with the prior exam suggestive of mild colonic ileus. No free air is seen. Feeding catheter is noted within the stomach. No bony abnormality is noted. IMPRESSION: Changes suggestive of colonic ileus new from the prior CT. Electronically Signed   By: Alcide Clever M.D.   On: 08/16/2022 20:33   DG CHEST PORT 1 VIEW  Result Date: 08/16/2022 CLINICAL DATA:  Respiratory failure EXAM: PORTABLE CHEST 1 VIEW COMPARISON:  Radiograph 08/11/2022, CT 08/13/2022 FINDINGS: Endotracheal tube tip at the level of the thoracic inlet. Right internal jugular central venous catheter tip overlying the mid upper SVC. Weighted enteric tube tip below the diaphragm not included in the field of view. Bilateral pleural effusions. Bibasilar as well as right midlung airspace disease with progression. Stable heart size and mediastinal contours. No pneumothorax. IMPRESSION: 1. Worsening bibasilar and right midlung airspace disease, pneumonia versus pulmonary edema. 2. Bilateral pleural effusions. 3. Stable support apparatus. Electronically Signed   By: Narda Rutherford M.D.   On: 08/16/2022 13:05   ECHOCARDIOGRAM LIMITED  Result Date: 08/16/2022    ECHOCARDIOGRAM LIMITED REPORT   Patient Name:   Kurt Blair Date of Exam: 08/16/2022 Medical Rec #:  [id number]      Height:       70.0 in Accession #:    [id number]     Weight:       292.3 lb Date of Birth:  15-Oct-1945      BSA:          2.453 m Patient Age:    77 years       BP:           91/64 mmHg Patient Gender: M               HR:           62 bpm. Exam Location:  Inpatient Procedure: Limited Echo, Limited Color Doppler and Cardiac Doppler Indications:    Pulmonary Embolus I26.09  History:        Patient has prior history of Echocardiogram examinations, most                 recent 08/24/2022. Fatty liver, Arrythmias:Cardiac Arrest; Risk                 Factors:Former Smoker and Dyslipidemia.  Sonographer:    Aron Baba Referring Phys: [id number] Martina Sinner  Sonographer Comments: Technically challenging study due to limited acoustic windows, echo performed with patient supine and on artificial respirator and patient is obese. Image acquisition challenging due to respiratory motion. IMPRESSIONS  1. Extremely limited due to poor sound wave transmission; not all views/doppler obtained; LV functoin appears to be preserved on limited images; RV  not well visualized.  2. Left ventricular ejection fraction, by estimation, is 60 to 65%. The left ventricle has normal function. There is mild left ventricular hypertrophy.  3. Right ventricular systolic function was not well visualized. The right ventricular size is not well visualized.  4. The mitral valve is normal in structure.  5. The aortic valve is tricuspid. FINDINGS  Left Ventricle: Left ventricular ejection fraction, by estimation, is 60 to 65%. The left ventricle has normal function. The left ventricular internal cavity size was normal in size. There is mild left ventricular hypertrophy. Right Ventricle: The right ventricular size is not well visualized. Right ventricular systolic function was not well visualized. Pericardium: There is no evidence of pericardial effusion. Mitral Valve: The mitral valve is normal in structure. Tricuspid Valve: The tricuspid valve is not well visualized. Aortic Valve: The aortic valve is tricuspid. Pulmonic Valve: The pulmonic valve was not well visualized. Aorta: The aortic root is normal in size and structure. Additional Comments: Extremely  limited due to poor sound wave transmission; not all views/doppler obtained; LV functoin appears to be preserved on limited images; RV not well visualized. Spectral Doppler performed. Color Doppler performed.  LEFT VENTRICLE PLAX 2D LVIDd:         3.80 cm LVIDs:         2.90 cm LV PW:         1.20 cm LV IVS:        1.10 cm  RIGHT VENTRICLE RV S prime:     10.90 cm/s TAPSE (M-mode): 1.6 cm Olga Millers MD Electronically signed by Olga Millers MD Signature Date/Time: 08/16/2022/10:14:38 AM    Final    IR IVC FILTER PLMT / S&I Lenise Arena GUID/MOD SED  Result Date: 08/14/2022 INDICATION: 77 year old male referred for IVC filter placement EXAM: ULTRASOUND-GUIDED ACCESS RIGHT COMMON FEMORAL VEIN IMAGE GUIDED PLACEMENT OF IVC FILTER MEDICATIONS: None. ANESTHESIA/SEDATION: None FLUOROSCOPY: Radiation Exposure Index (as provided by the fluoroscopic device): 113 mGy Kerma COMPLICATIONS: None PROCEDURE: The procedure, risks, benefits, and alternatives were explained to the patient. Specific risks discussed include bleeding, infection, contrast reaction, renal failure, IVC filter fracture, migration, iliocaval thrombus (3-4% incidence), need for further procedure, need for further surgery, pulmonary embolism, cardiopulmonary collapse, death. Questions regarding the procedure were encouraged and answered. The patient understands and consents to the procedure. Ultrasound survey of the right inguinal region was performed with images stored and sent to PACs. Ultrasound confirmed patency of the vessel. A single wall needle was used access the right common femoral vein under ultrasound. With excellent blood flow returned, a Bentson wire was passed through the needle, observed to enter the IVC under fluoroscopy. The needle was removed, and a standard 5 [place] vascular sheath was placed. The dilator was removed and the sheath was flushed. Dilator was passed on the Bentson wire. The delivery sheath for a retrievable Bard Denali filter  was passed over the Bentson wire into the IVC. The wire was removed and small contrast was used to confirm IVC location. IVC cavagram performed. Dilator was removed, and the IVC filter was then delivered, positioned below the lowest renal vein. Repeat cavagram performed, and the catheter was removed. Manual pressure was used for hemostasis. Patient tolerated the procedure well and remained hemodynamically stable throughout. No complications were encountered and no significant blood loss was encounter. IMPRESSION: Status post image guided placement of retrievable IVC filter via right common femoral vein approach. Signed, Yvone Neu. Miachel Roux, RPVI [facility]  PLAN: This IVC filter is potentially retrievable. The patient can be assessed for filter retrieval by Interventional Radiology in approximately 8-12 weeks. Further recommendations regarding filter retrieval, continued surveillance or declaration of device permanence, can be made at that time. Electronically Signed   By: Gilmer Mor D.O.   On: 08/14/2022 16:06   CT HEAD WO CONTRAST ( )  Result Date: 08/14/2022 CLINICAL DATA:  Altered mental status EXAM: CT HEAD WITHOUT CONTRAST TECHNIQUE: Contiguous axial images were obtained from the base of the skull through the vertex without intravenous contrast. RADIATION DOSE REDUCTION: This exam was performed according to the departmental dose-optimization program which includes automated exposure control, adjustment of the mA and/or kV according to patient size and/or use of iterative reconstruction technique. COMPARISON:  CT head 09/06/2022 FINDINGS: Brain: There is no acute intracranial hemorrhage, extra-axial fluid collection, or acute infarct. Parenchymal volume is stable. The ventricles are stable in size. Gray-white differentiation is preserved. Background chronic small-vessel ischemic change is stable. The pituitary and suprasellar region are  normal. There is no mass lesion. There is no mass effect or midline shift. Vascular: There is calcification of the bilateral carotid siphons. Skull: Normal. Negative for fracture or focal lesion. Sinuses/Orbits: The paranasal sinuses are clear. The globes and orbits are unremarkable. Other: None. IMPRESSION: Stable noncontrast head CT with no acute intracranial pathology. Electronically Signed   By: Lesia Hausen M.D.   On: 08/14/2022 11:54   VAS [place] LOWER EXTREMITY VENOUS (DVT)  Result Date: 08/13/2022  Lower Venous DVT Study Patient Name:  Kurt Blair  Date of Exam:   08/13/2022 Medical Rec #: [id number]       Accession #:    [id number] Date of Birth: March 15, 1946       Patient Gender: M Patient Age:   49 years Exam Location:  [facility] Procedure:      VAS [place] LOWER EXTREMITY VENOUS (DVT) Referring Phys: Vilma Meckel --------------------------------------------------------------------------------  Indications: Bilateral lower extremity edema.  Limitations: Body habitus and poor ultrasound/tissue interface. Skin changes. Comparison Study: 11-25-2017 Prior bilateral lower extremity venous study was                   negative for DVT. Performing Technologist: Jean Rosenthal RDMS, RVT  Examination Guidelines: A complete evaluation includes B-mode imaging, spectral Doppler, color Doppler, and power Doppler as needed of all accessible portions of each vessel. Bilateral testing is considered an integral part of a complete examination. Limited examinations for reoccurring indications may be performed as noted. The reflux portion of the exam is performed with the patient in reverse Trendelenburg.  +---------+---------------+---------+-----------+----------+---------------+ RIGHT    CompressibilityPhasicitySpontaneityPropertiesThrombus Aging  +---------+---------------+---------+-----------+----------+---------------+ CFV      Full           Yes      Yes                                   +---------+---------------+---------+-----------+----------+---------------+ SFJ      Full                                                         +---------+---------------+---------+-----------+----------+---------------+ FV Prox  Full                                                         +---------+---------------+---------+-----------+----------+---------------+  FV Mid   Full                                                         +---------+---------------+---------+-----------+----------+---------------+ FV DistalFull                                                         +---------+---------------+---------+-----------+----------+---------------+ PFV      Full                                                         +---------+---------------+---------+-----------+----------+---------------+ POP      Full           Yes      Yes                                  +---------+---------------+---------+-----------+----------+---------------+ PTV                     Yes      Yes                  Patent by color +---------+---------------+---------+-----------+----------+---------------+ PERO                    Yes      Yes                  Patent by color +---------+---------------+---------+-----------+----------+---------------+ Gastroc  Full                                                         +---------+---------------+---------+-----------+----------+---------------+   +---------+---------------+---------+-----------+----------+-----------------+ LEFT     CompressibilityPhasicitySpontaneityPropertiesThrombus Aging    +---------+---------------+---------+-----------+----------+-----------------+ CFV      Full           Yes      Yes                                    +---------+---------------+---------+-----------+----------+-----------------+ SFJ      Full                                                            +---------+---------------+---------+-----------+----------+-----------------+ FV Prox  Partial                                      Age Indeterminate +---------+---------------+---------+-----------+----------+-----------------+ FV Mid   Partial  Age Indeterminate +---------+---------------+---------+-----------+----------+-----------------+ FV DistalPartial                                      Age Indeterminate +---------+---------------+---------+-----------+----------+-----------------+ PFV      Full                                                           +---------+---------------+---------+-----------+----------+-----------------+ POP      Partial        Yes      Yes                  Age Indeterminate +---------+---------------+---------+-----------+----------+-----------------+ PTV                     Yes      Yes                  Patent by color   +---------+---------------+---------+-----------+----------+-----------------+ PERO                    Yes      Yes                  Patent by color   +---------+---------------+---------+-----------+----------+-----------------+ Gastroc  None           No       No                   Age Indeterminate +---------+---------------+---------+-----------+----------+-----------------+     Summary: RIGHT: - There is no evidence of deep vein thrombosis in the lower extremity.  - No cystic structure found in the popliteal fossa. - Prolonged venous reflux times noted.  LEFT: - Findings consistent with age indeterminate deep vein thrombosis involving the left femoral vein, left popliteal vein, and left gastrocnemius veins. - No cystic structure found in the popliteal fossa. - Prolonged venous reflux times noted.  *See table(s) above for measurements and observations. Electronically signed by Sherald Hess MD on 08/13/2022 at 2:23:10 PM.    Final    CT CHEST ABDOMEN PELVIS WO  CONTRAST  Addendum Date: 08/13/2022   ADDENDUM REPORT: 08/13/2022 10:52 ADDENDUM: Study discussed by telephone with Dr. Cyndie Chime on 08/13/2022 at 1035 hours. Electronically Signed   By: Odessa Fleming M.D.   On: 08/13/2022 10:52   Result Date: 08/13/2022 CLINICAL DATA:  77 year old male status post cardiac arrest. Positive pulmonary emboli on recent CTA. Query retroperitoneal hematoma. EXAM: CT CHEST, ABDOMEN AND PELVIS WITHOUT CONTRAST TECHNIQUE: Multidetector CT imaging of the chest, abdomen and pelvis was performed following the standard protocol without IV contrast. RADIATION DOSE REDUCTION: This exam was performed according to the departmental dose-optimization program which includes automated exposure control, adjustment of the mA and/or kV according to patient size and/or use of iterative reconstruction technique. COMPARISON:  Portable abdomen radiograph 08/12/2022. Portable chest 08/11/2022. CTA chest 08/23/2022. FINDINGS: CT CHEST FINDINGS Cardiovascular: Small volume pericardial effusion is more apparent than on 08/25/2022. Calcified coronary artery atherosclerosis. No cardiomegaly. Vascular patency is not evaluated in the absence of IV contrast. Right IJ approach central line now in place, terminates at the SVC level. Mediastinum/Nodes: Enteric tube courses in the esophagus, noncontrast superior mediastinum appears negative. However, there is a new subcarinal intermediate density (77  Hounsfield units) rounded masslike area which tracks toward the right pulmonary veins on series 3, image 31. This is 21 by 52 x 31 mm, for an estimated volume of 17 mL. This collection is to the right and lateral of the descending distal esophagus. No significant regional posterior mediastinal mass effect. No other noncontrast mediastinal abnormality. Lungs/Pleura: Increased and moderate size layering right pleural effusion now but simple fluid density suggesting transudate. Increased and small layering left pleural effusion.  Endotracheal tube tip satisfactory above the carina. Major airways remain patent. Increasing dependent lung consolidation which is greater on the right. Air bronchograms in both lower lobes. Additional asymmetric patchy and peribronchial ground-glass opacity in the upper lungs. Musculoskeletal: Nondisplaced to minimally displaced right anterior 2nd through 7th rib fractures. Similar anterior left side 3rd through 7th anterior rib fractures some at the costochondral junctions. These are likely CPR related and probably unchanged. Superimposed chronic posterior left rib fractures. No acute osseous abnormality identified. In the thoracic spine. CT ABDOMEN PELVIS FINDINGS Hepatobiliary: Vicarious contrast excretion in the gallbladder plus mild cholelithiasis. No pericholecystic inflammation. Negative noncontrast liver. Pancreas: Negative. Spleen: Negative. Adrenals/Urinary Tract: Normal adrenal glands. No hydronephrosis or suspicious renal lesion. No hydroureter. Bladder mostly decompressed by Foley catheter. Stomach/Bowel: Enteric tube terminates in the distal stomach. No dilated large or small bowel. Redundant sigmoid colon is gas distended in its non dependent segment. Extensive diverticulosis of the transverse, descending, and sigmoid colon. Small volume oral contrast in the cecum. No active bowel inflammation identified. No free air. Stomach and duodenum appear negative. Small volume oral contrast in the gastric fundus. Vascular/Lymphatic: Normal caliber abdominal aorta. Aortoiliac calcified atherosclerosis. Vascular patency is not evaluated in the absence of IV contrast. Mild retroperitoneal inflammatory stranding on the right side but no discrete retroperitoneal hematoma. No lymphadenopathy. Reproductive: Urethral catheter in place, otherwise negative. Other: Mild presacral stranding. Small volume of hyperdense fluid free fluid in the anterior pelvis series 3, image 110. Musculoskeletal: Advanced lumbar spine  degeneration. Probable benign L4 vertebral body hemangioma. No acute or suspicious osseous lesion identified. But posterior left superior buttock, flank subcutaneous heterogeneous collection with up to 70 Hounsfield unit density. This is likely subcutaneous hematoma and encompasses 39 x 64 x 74 mm (AP by transverse by CC) for estimated volume up to 90 mL. Other more symmetric bilateral body wall edema in the abdomen and pelvis, also the chest. No other discrete body wall hematoma. IMPRESSION: 1. Positive for a small volume of Hemoperitoneum in the pelvis, origin unclear. Also evidence of a left flank subcutaneous hematoma (estimated volume up to 90 mL). And also cannot exclude a small volume of Posterior Mediastinal Hematoma which is new from the Chest CTA 08/30/2022 (approximately 17 mL, see series 3, image 31). But no retroperitoneal hemorrhage in the abdomen or pelvis. 2. A small volume pericardial effusion is more apparent than on 08/27/2022. And Increased layering bilateral pleural effusions, with simple fluid density favoring transudate. Increasing dependent lung consolidation which could be pneumonia or pulmonary infarct (recent PE). Additional patchy ground-glass opacity in the upper lungs might be interstitial edema or infectious. 3. No other acute or inflammatory process identified in the noncontrast chest, abdomen, or pelvis. Satisfactory endotracheal tube, enteric tube, Foley catheter. Right IJ approach central line terminates at the SVC level. 4. CPR related bilateral anterior rib fractures. Cholelithiasis. Diverticulosis of the large bowel. Aortic Atherosclerosis (ICD10-I70.0). Electronically Signed: By: Odessa Fleming M.D. On: 08/13/2022 10:29   DG Abd Portable 1V  Result Date: 08/12/2022 CLINICAL DATA:  Feeding tube placement EXAM: PORTABLE ABDOMEN - 1 VIEW COMPARISON:  09/07/2022 FINDINGS: Tip of feeding tube is seen in the region of the antrum of the stomach. Small amount of contrast is seen in the  lumen of fundus of the stomach. Transverse diameter of heart is increased. Small to moderate bilateral pleural effusions are seen. IMPRESSION: Tip of feeding tube is seen in the antrum of the stomach. Electronically Signed   By: Ernie Avena M.D.   On: 08/12/2022 12:01   DG CHEST PORT 1 VIEW  Result Date: 08/11/2022 CLINICAL DATA:  Shortness of breath EXAM: PORTABLE CHEST 1 VIEW COMPARISON:  Portable exam 1315 hours compared to 0901 hours FINDINGS: Tip of endotracheal tube projects 4.8 cm above carina. Nasogastric tube extends into stomach. RIGHT jugular line tip projects over SVC. Upper normal size of cardiac silhouette. Mediastinal contours and pulmonary vascularity normal. Infiltrates at mid to lower lungs bilaterally with small bibasilar effusions. No pneumothorax. IMPRESSION: Bibasilar infiltrates and small pleural effusions. Electronically Signed   By: Ulyses Southward M.D.   On: 08/11/2022 13:29   US RENAL  Result Date: 08/11/2022 CLINICAL DATA:  Renal failure. EXAM: RENAL / URINARY TRACT ULTRASOUND COMPLETE COMPARISON:  None Available. FINDINGS: Evaluation of the kidneys is suboptimal due to body habitus. Right Kidney: Renal measurements: 12.0 cm x 6.3 cm x 7.3 cm = volume: 290 mL. Echogenicity within normal limits. No mass or hydronephrosis visualized. Left Kidney: Renal measurements: 12.3 cm x 6.4 cm x 5.8 cm = volume: 234 mL. Echogenicity within normal limits. No mass or hydronephrosis visualized. Bladder: Not identified. Other: None. IMPRESSION: The bladder could not be identified. Otherwise normal renal ultrasound. Electronically Signed   By: Lesia Hausen M.D.   On: 08/11/2022 12:23   DG CHEST PORT 1 VIEW  Result Date: 08/11/2022 CLINICAL DATA:  Provided history: Shortness of breath. EXAM: PORTABLE CHEST 1 VIEW COMPARISON:  Prior chest radiographs 09/06/2022 and earlier. Chest CT 08/28/2022. FINDINGS: ET tube present with tip at the level of the clavicular heads. Right IJ approach central  venous catheter with tip at the level of the mid SVC. An enteric tube passes below the level of the left hemidiaphragm and terminates outside of the field of view. The cardiomediastinal silhouette is unchanged. Ill-defined opacities within the mid and lower lung fields, bilaterally, progressed on the right. Background prominence of interstitial lung markings compatible with interstitial edema. Blunting of the bilateral costophrenic angles, which could reflect the presence of small bilateral pleural effusions. No evidence of pneumothorax. No acute osseous abnormality identified. IMPRESSION: 1. Support apparatus, as described. 2. Ill-defined opacities within the mid and lower lung fields, bilaterally, progressed on the right. This may reflect atelectasis, pneumonia and/or pulmonary infarcts. 3. Background interstitial edema. 4. Blunting of the bilateral costophrenic angles, raising the possibility of small bilateral pleural effusions. Electronically Signed   By: Jackey Loge D.O.   On: 08/11/2022 09:33   CT Angio Chest Pulmonary Embolism (PE) W or WO Contrast  Addendum Date: 08/22/2022   ADDENDUM REPORT: 08/26/2022 12:43 ADDENDUM: Critical Value/emergent results were called by telephone at the time of interpretation on 09/05/2022 at 12:43 pm to provider Cyndie Chime , who verbally acknowledged these results. Electronically Signed   By: Amie Portland M.D.   On: 09/07/2022 12:43   Result Date: 08/19/2022 CLINICAL DATA:  Witnessed cardiac arrest while patient trying to walk to the bathroom. EXAM: CT ANGIOGRAPHY CHEST WITH CONTRAST TECHNIQUE: Multidetector CT imaging of the chest was performed using the standard protocol during  bolus administration of intravenous contrast. Multiplanar CT image reconstructions and MIPs were obtained to evaluate the vascular anatomy. RADIATION DOSE REDUCTION: This exam was performed according to the departmental dose-optimization program which includes automated exposure control, adjustment of  the mA and/or kV according to patient size and/or use of iterative reconstruction technique. CONTRAST:  60mL OMNIPAQUE IOHEXOL 350 MG/ML SOLN COMPARISON:  03/24/2018. FINDINGS: Cardiovascular: Pulmonary arteries are well opacified. There are predominantly right-sided pulmonary emboli. Partly occlusive pulmonary emboli are noted in the proximal right lower lobe segmental vessels. A strand-like pulmonary embolus is noted in the central right middle lobe pulmonary arteries with another similar strand-like pulmonary embolus noted in the central right upper lobe pulmonary arteries. Additional small pulmonary emboli are noted to right upper lobe segmental branches. Possible small pulmonary embolus to a subsegmental branch to the left upper lobe. A lower lobe segmental pulmonary artery to the lateral basilar segment is truncated in area of left lower lobe atelectasis/consolidation, etiology not defined but suspected to be due to an occlusive pulmonary embolism. Heart normal in size. Trace pericardial effusion. Mild left coronary artery and circumflex coronary artery calcifications. Aorta not opacified, normal in caliber. Main pulmonary artery mildly dilated to 3.8 cm. RV LV ratio calculated at 1.78, elevated. Mediastinum/Nodes: Well-positioned endotracheal tube, 2.1 cm above the carina, and nasogastric tube. No neck base, mediastinal or hilar masses. No enlarged lymph nodes. Trachea and esophagus are unremarkable. Lungs/Pleura: Dependent opacities noted in both lower and upper lobes consistent with atelectasis. There are patchy ground-glass opacities in both upper lobes and the right middle lobe. No pneumothorax.  Trace right pleural effusion. Upper Abdomen: Trace ascites between the liver and right hemidiaphragm. No visualized acute abnormality. Musculoskeletal: Nondisplaced fractures of the anterolateral right fourth and fifth ribs. Review of the MIP images confirms the above findings. IMPRESSION: 1. Positive for  pulmonary emboli, predominantly right-sided as detailed above. Positive for acute PE with CT evidence of right heart strain (RV/LV Ratio = 1.78) consistent with at least submassive (intermediate risk) PE. The presence of right heart strain has been associated with an increased risk of morbidity and mortality. Please refer to the "Code PE Focused" order set in EPIC. 2. Significant dependent atelectasis in the lungs with trace right pleural effusion. 3. Bilateral ground-glass opacities that may reflect edema, but multifocal infection should be considered in the proper clinical setting. 4. Nondisplaced right anterolateral fourth and fifth rib fractures which may be the consequence of CPR. 5. Well-positioned endotracheal tube and nasal/orogastric tube. 6. Mild coronary artery calcifications. Electronically Signed: By: Amie Portland M.D. On: 08/19/2022 12:22   CT HEAD WO CONTRAST ( )  Result Date: 08/28/2022 CLINICAL DATA:  Cardiac arrest, witnessed by family while trying to walk to the bathroom. EXAM: CT HEAD WITHOUT CONTRAST TECHNIQUE: Contiguous axial images were obtained from the base of the skull through the vertex without intravenous contrast. RADIATION DOSE REDUCTION: This exam was performed according to the departmental dose-optimization program which includes automated exposure control, adjustment of the mA and/or kV according to patient size and/or use of iterative reconstruction technique. COMPARISON:  None Available. FINDINGS: Brain: No evidence of acute infarction, hemorrhage, hydrocephalus, extra-axial collection or mass lesion/mass effect. Patchy bilateral periventricular white matter hypoattenuation noted consistent with mild chronic microvascular ischemic change. Vascular: No hyperdense vessel or unexpected calcification. Skull: Normal. Negative for fracture or focal lesion. Sinuses/Orbits: Globes and orbits are unremarkable. Mild ethmoid sinus mucosal thickening. Other: None. IMPRESSION: 1. No  acute intracranial abnormalities. Electronically Signed   By: Onalee Hua  Ormond M.D.   On: 08/12/2022 12:05   ECHOCARDIOGRAM COMPLETE  Result Date: 08/11/2022    ECHOCARDIOGRAM REPORT   Patient Name:   Kurt Blair Date of Exam: 09/08/2022 Medical Rec #:  [id number]      Height:       70.0 in Accession #:    [id number]     Weight:       268.0 lb Date of Birth:  1945/10/24      BSA:          2.364 m Patient Age:    77 years       BP:           90/66 mmHg Patient Gender: M              HR:           59 bpm. Exam Location:  Inpatient Procedure: 2D Echo, Cardiac Doppler, Color Doppler and Intracardiac            Opacification Agent Indications:    Cardiomyopathy-Unspecified I42.9  History:        Patient has prior history of Echocardiogram examinations, most                 recent 10/08/2017. CHF, COPD, Signs/Symptoms:Dyspnea; Risk                 Factors:Hypertension, Dyslipidemia and Current Smoker.  Sonographer:    Lucendia Herrlich Referring Phys: [id number] PEDRO EDUARDO CARDAMA  Sonographer Comments: Technically difficult study due to poor echo windows, suboptimal apical window and echo performed with patient supine and on artificial respirator. IMPRESSIONS  1. Difficult acoustic windows limit study.  2. Poor acoustic windows No obvious wall motion abnormalities.. Left ventricular ejection fraction, by estimation, is 55 to 60%. The left ventricle has normal function. There is mild left ventricular hypertrophy.  3. RV free wall difficult to see well Overall function appears to probably be moderately down at least. COnsider MRI to further define wall motion, function. . Right ventricular systolic function is mildly reduced. The right ventricular size is mildly enlarged.  4. The mitral valve is grossly normal. Trivial mitral valve regurgitation.  5. The aortic valve is tricuspid. Aortic valve regurgitation is not visualized.  6. The inferior vena cava is dilated in size with <50% respiratory variability, suggesting right  atrial pressure of 15 mmHg. FINDINGS  Left Ventricle: Poor acoustic windows No obvious wall motion abnormalities. Left ventricular ejection fraction, by estimation, is 55 to 60%. The left ventricle has normal function. Definity contrast agent was given IV to delineate the left ventricular endocardial borders. The left ventricular internal cavity size was normal in size. There is mild left ventricular hypertrophy. Right Ventricle: RV free wall difficult to see well Overall function appears to probably be moderately down at least. COnsider MRI to further define wall motion, function. The right ventricular size is mildly enlarged. Right vetricular wall thickness was  not assessed. Right ventricular systolic function is mildly reduced. Left Atrium: Left atrial size was normal in size. Right Atrium: Right atrial size was normal in size. Pericardium: Trivial pericardial effusion is present. Mitral Valve: The mitral valve is grossly normal. Trivial mitral valve regurgitation. Tricuspid Valve: The tricuspid valve is grossly normal. Tricuspid valve regurgitation is trivial. Aortic Valve: The aortic valve is tricuspid. Aortic valve regurgitation is not visualized. Aortic valve mean gradient measures 4.0 mmHg. Aortic valve peak gradient measures 8.4 mmHg. Aortic valve area, by VTI measures 2.64 cm. Pulmonic Valve: The pulmonic  valve was normal in structure. Pulmonic valve regurgitation is not visualized. Aorta: The aortic root and ascending aorta are structurally normal, with no evidence of dilitation. Venous: The inferior vena cava is dilated in size with less than 50% respiratory variability, suggesting right atrial pressure of 15 mmHg. IAS/Shunts: No atrial level shunt detected by color flow Doppler.  LEFT VENTRICLE PLAX 2D LVIDd:         4.20 cm   Diastology LVIDs:         3.30 cm   LV e' medial:   6.35 cm/s LV PW:         1.30 cm   LV E/e' medial: 8.1 LV IVS:        1.10 cm LVOT diam:     2.40 cm LV SV:         77 LV SV  Index:   32 LVOT Area:     4.52 cm  RIGHT VENTRICLE             IVC RV S prime:     12.00 cm/s  IVC diam: 2.80 cm TAPSE (M-mode): 1.9 cm LEFT ATRIUM           Index        RIGHT ATRIUM           Index LA diam:      3.90 cm 1.65 cm/m   RA Area:     22.20 cm LA Vol (A2C): 47.0 ml 19.88 ml/m  RA Volume:   64.40 ml  27.24 ml/m LA Vol (A4C): 57.7 ml 24.41 ml/m  AORTIC VALVE AV Area (Vmax):    3.17 cm AV Area (Vmean):   3.05 cm AV Area (VTI):     2.64 cm AV Vmax:           145.00 cm/s AV Vmean:          94.500 cm/s AV VTI:            0.291 m AV Peak Grad:      8.4 mmHg AV Mean Grad:      4.0 mmHg LVOT Vmax:         101.62 cm/s LVOT Vmean:        63.660 cm/s LVOT VTI:          0.170 m LVOT/AV VTI ratio: 0.58  AORTA Ao Root diam: 3.70 cm Ao Asc diam:  3.80 cm MITRAL VALVE               TRICUSPID VALVE MV Area (PHT): 2.95 cm    TR Peak grad:   5.4 mmHg MV Decel Time: 257 msec    TR Vmax:        116.00 cm/s MV E velocity: 51.40 cm/s MV A velocity: 66.50 cm/s  SHUNTS MV E/A ratio:  0.77        Systemic VTI:  0.17 m                            Systemic Diam: 2.40 cm Dietrich Pates MD Electronically signed by Dietrich Pates MD Signature Date/Time: 09/07/2022/11:51:06 AM    Final    DG CHEST PORT 1 VIEW  Result Date: 08/31/2022 CLINICAL DATA:  Status post central line placement. EXAM: PORTABLE CHEST 1 VIEW COMPARISON:  Single-view of the chest 09/01/2022 at 5:24 a.m. FINDINGS: New right IJ catheter is in place with the tip in the mid superior vena cava. Endotracheal tube and NG tube  unchanged. Defibrillator pads now in place. Left basilar atelectasis again seen. Right is clear. No pneumothorax. IMPRESSION: 1. New right IJ catheter tip is in the mid superior vena cava. No pneumothorax. 2. No change in left basilar atelectasis. Electronically Signed   By: Drusilla Kanner M.D.   On: 08/15/2022 09:56   DG Chest Portable 1 View  Result Date: 08/13/2022 CLINICAL DATA:  Intubation EXAM: PORTABLE CHEST 1 VIEW COMPARISON:   08/03/2022 FINDINGS: Endotracheal tube with tip at the clavicular heads. An enteric tube at least reaches the stomach. Bilateral airspace disease greater on the right. There could be small volume pleural fluid on the left. No pneumothorax. Generous heart size accentuated by technique. IMPRESSION: 1. Located endotracheal and enteric tubes. 2. Low volume chest with perihilar airspace disease suggesting pneumonia or aspiration. Electronically Signed   By: Tiburcio Pea M.D.   On: 09/06/2022 05:47   DG Abd Portable 1 View  Result Date: 09/05/2022 CLINICAL DATA:  Verify orogastric tube EXAM: PORTABLE ABDOMEN - 1 VIEW COMPARISON:  None Available. FINDINGS: There is an enteric tube with tip and side port at the stomach. Extensive artifact from support hardware. Lower chest as described on dedicated radiograph. No gas dilated bowel. IMPRESSION: Located enteric tube with tip and side port at the stomach. Electronically Signed   By: Tiburcio Pea M.D.   On: 09/08/2022 05:45   DG Chest 2 View  Result Date: 08/03/2022 CLINICAL DATA:  Cough and shortness of breath. Lung surgery 30 years ago. EXAM: CHEST - 2 VIEW COMPARISON:  06/10/2018 FINDINGS: Lungs are adequately inflated without focal airspace consolidation or effusion. Mild stable elevation of the left hemidiaphragm. Cardiomediastinal silhouette and remainder of the exam is unchanged. IMPRESSION: No active cardiopulmonary disease. Electronically Signed   By: Elberta Fortis M.D.   On: 08/03/2022 12:19    Microbiology No results found for this or any previous visit (from the past 240 hour(s)).  Lab Basic Metabolic Panel: No results for input(s): "NA", "K", "CL", "CO2", "GLUCOSE", "BUN", "CREATININE", "CALCIUM", "MG", "PHOS" in the last 168 hours. Liver Function Tests: No results for input(s): "AST", "ALT", "ALKPHOS", "BILITOT", "PROT", "ALBUMIN" in the last 168 hours. No results for input(s): "LIPASE", "AMYLASE" in the last 168 hours. No results for  input(s): "AMMONIA" in the last 168 hours. CBC: No results for input(s): "WBC", "NEUTROABS", "HGB", "HCT", "MCV", "PLT" in the last 168 hours. Cardiac Enzymes: No results for input(s): "CKTOTAL", "CKMB", "CKMBINDEX", "TROPONINI" in the last 168 hours. Sepsis Labs: No results for input(s): "PROCALCITON", "WBC", "LATICACIDVEN" in the last 168 hours.  Procedures/Operations  N/A   Lorin Glass 08/29/2022, 5:27 PM

## 2022-09-09 NOTE — Progress Notes (Signed)
NAME:  Kurt Blair, MRN:  [id number], DOB:  1945/11/26, LOS: 10 ADMISSION DATE:  09/06/2022, CONSULTATION DATE:  09/01/2022 REFERRING MD:  Eudelia Bunchardama - EM, CHIEF COMPLAINT:  cardiac arrest    History of Present Illness:  77 y/o M PMH diastolic HF, COPD, HTN, PTSD, etoh abuse presented to ED following witnessed arrest at home while walking to bathroom. With EMS -- CPR [phone], [phone]. It sounds like pulses were lost a third time just prior to arrival and pt required several rounds of ACLS, though duration is not clear.   Intubated in ED. Remained shock after ROSC, req NE and Epi. Woke and was purposeful in ER.   Per report, he was SOB preceding arrest for several days (?up to 2 wks).  Was seen by his PCP 3/25 with reports of shortness of breath at that time, but unchanged for 10 years, mild intermittent cough with LE edema.   Pertinent  Medical History  COPD Diastolic HF HTN  Etoh Abuse PTSD   Significant Hospital Events: Including procedures, antibiotic start and stop dates in addition to other pertinent events   4/1 admit to ICU following witnessed OOH arrest x2, and subsequent ED arrest, noted to have PE - given thrombolytic therapy and started on heparin gtt 4/4 CT Chest abdomen pelvis ordered for drop in hemoglobin, noted small volume hemoperitoneum in pelvis, left flank subcutaneous hematoma and posterior mediastinal hematoma. Right IJ hematoma. 4/5 transfused 1 unit PRBCs and 1 unit platelets. IVC filter placed by IR 4/6 increasing FiO2 requirements on PEEP of 8 4/7 TTE, preserved EF. Difficult to visualize RV 4/8 NG tube placed  Interim History / Subjective:   Abdominal distension somewhat improved. Discussed plan with patient's wife, Elease Hashimotoatricia, and one of their children yesterday. They will be by today and plan to extubate today.   Objective   Blood pressure (!) 92/43, pulse 97, temperature 99.8 F (37.7 C), temperature source Oral, resp. rate (!) 26, weight 132.9 kg, SpO2 97  %. CVP:  [12 mmHg] 12 mmHg  Vent Mode: PRVC FiO2 (%):  [50 %] 50 % Set Rate:  [26 bmp] 26 bmp Vt Set:  [550 mL] 550 mL PEEP:  [15 cmH20] 15 cmH20 Plateau Pressure:  [27 cmH20-32 cmH20] 27 cmH20   Intake/Output Summary (Last 24 hours) at 09/02/22 0752 Last data filed at 09/02/22 0658 Gross per 24 hour  Intake 1358.99 ml  Output 1345 ml  Net 13.99 ml    Filed Weights   08/17/22 0355 08/19/22 0500 05-03-2023 0500  Weight: 130.3 kg 133.8 kg 132.9 kg   Examination: General: ill appearing HENT: normocephalic, ET tube in place. Cardio: regular rate and rhythm, no m/g/r Pulm: course breath sounds Abd: distended, tender.  Skin: no rashes Extremities: warm , 1-2+ edema Neuro: sedated  Assessment & Plan:   See IPAL note from 4/09. Update 4/10, patient's family will plan for terminal extubation today. Will start versed ggt once family arrives and prior to extubation  Witnessed OOH Cardiac Arrest, PEA Massive pulmonary embolism Left LE DVT - s/p thrombolytic therapy 4/1, IVC filter placed 4/5 - hemodynamically stable on pressors - plan for terminal extubation today  Acute respiratory failure with hypoxemia and hypercarbia  COPD  CAP vs Aspiration PNA R>L  Plan for extubation today.   Hx Diastolic HF, NYHA Class 1 Continues to be severely hypervolemic. Was not adequately diuresing and had worsening renal injury. Family do not believe patient would want dialysis.   Non-oliguric AKI ATN secondary to shock,  now with worsening creatinine and urinary output with lasix and hypervolemia. Wife does not believe patient would want CRRT. Ongoing GOC conversation today.  - trend BMP  Acute blood loss anemia Right IJ hematoma Posterior mediastinal Hematoma Hemoperitoneum, mild Hemoglobin stable at 8.7 yesterday  Shock Multifactorial obstructive and likely component of RHF  Ileus Continues to have abdominal distension, some improvement on NG tube. With plan for extubation and no  further aggressive interventions will not order further imaging.   Best Practice (right click and "Reselect all SmartList Selections" daily)  Diet/type: tubefeeds held with ileus DVT prophylaxis: heparin GI prophylaxis: PPI Lines: Central line. Foley:  Yes, and it is still needed Code Status:  DNR Last date of multidisciplinary goals of care discussion: IPAL 4/10, going conversations today  Labs   CBC: Recent Labs  Lab 08/15/22 0353 08/16/22 0340 08/17/22 0500 08/18/22 0208 08/18/22 0211 08/19/22 0326  WBC 6.6 10.2 12.8* 11.6*  --  12.7*  NEUTROABS  --   --   --  9.5*  --  10.5*  HGB 9.1* 9.9* 9.1* 8.5* 8.8* 8.7*  HCT 25.4* 28.1* 25.9* 24.3* 26.0* 25.6*  MCV 85.5 87.3 85.2 86.5  --  87.4  PLT 117* 169 186 200  209  --  269     Basic Metabolic Panel: Recent Labs  Lab 08/14/22 0445 08/15/22 0353 08/16/22 0340 08/17/22 0500 08/18/22 0208 08/18/22 0211 08/18/22 1616 08/19/22 0326  NA 140   < > 143 142 142 146* 145 142  K 3.3*   < > 4.1 4.0 4.3 4.5 4.3 4.3  CL 104   < > 108 108 109  --  109 109  CO2 25   < > 24 23 22   --  22 20*  GLUCOSE 118*   < > 142* 97 87  --  78 80  BUN 48*   < > 39* 35* 41*  --  46* 52*  CREATININE 1.96*   < > 1.68* 1.50* 1.94*  --  2.26* 2.66*  CALCIUM 7.9*   < > 8.2* 8.2* 8.5*  --  8.6* 8.2*  MG 2.1  --   --  2.2  --   --  2.3 2.2  PHOS 2.9  --   --  3.2  --   --   --   --    < > = values in this interval not displayed.    GFR: Estimated Creatinine Clearance: 32.4 mL/min (A) (by C-G formula based on SCr of 2.66 mg/dL (H)). Recent Labs  Lab 08/16/22 0340 08/17/22 0500 08/18/22 0208 08/19/22 0326  WBC 10.2 12.8* 11.6* 12.7*     Liver Function Tests: Recent Labs  Lab 08/16/22 0907 08/17/22 0500 08/18/22 0208  AST 34 22 18  ALT 18 15 12   ALKPHOS 61 69 56  BILITOT 1.3* 3.3* 5.7*  PROT 5.5* 5.8* 6.2*  ALBUMIN 2.4* 2.8* 3.3*    No results for input(s): "LIPASE", "AMYLASE" in the last 168 hours. No results for input(s):  "AMMONIA" in the last 168 hours.  ABG    Component Value Date/Time   PHART 7.303 (L) 08/18/2022 0211   PCO2ART 48.8 (H) 08/18/2022 0211   PO2ART 92 08/18/2022 0211   HCO3 23.9 08/18/2022 0211   TCO2 25 08/18/2022 0211   ACIDBASEDEF 2.0 08/18/2022 0211   O2SAT 77.4 08/19/2022 1600     Coagulation Profile: Recent Labs  Lab 08/18/22 0208  INR 1.4*     Cardiac Enzymes: No results for input(s): "CKTOTAL", "  CKMB", "CKMBINDEX", "TROPONINI" in the last 168 hours.  HbA1C: Hgb A1c MFr Bld  Date/Time Value Ref Range Status  08/03/2022 12:13 PM 6.4 4.6 - 6.5 % Final    Comment:    Glycemic Control Guidelines for People with Diabetes:Non Diabetic:  <6%Goal of Therapy: <7%Additional Action Suggested:  >8%   02/12/2022 02:31 PM 6.2 4.6 - 6.5 % Final    Comment:    Glycemic Control Guidelines for People with Diabetes:Non Diabetic:  <6%Goal of Therapy: <7%Additional Action Suggested:  >8%     CBG: Recent Labs  Lab 08/19/22 1513 08/19/22 1907 08/19/22 2318 08/12/2022 0302 09/03/2022 0739  GLUCAP 79 84 86 92 97     Vasili Adden Strout DO  Internal Medicine Resident PGY-3 [place]  Pager: [phone]

## 2022-09-09 NOTE — Progress Notes (Signed)
Returned higher dose bag of fent. Intact to pharmacy. Receiving pharmacists was Windy Carina

## 2022-09-09 DEATH — deceased

## 2022-11-04 LAB — MISC LABCORP TEST (SEND OUT): Labcorp test code: 182212

## 2023-07-09 ENCOUNTER — Encounter: Payer: Self-pay | Admitting: Internal Medicine

## 2023-07-09 ENCOUNTER — Other Ambulatory Visit: Payer: Self-pay | Admitting: Internal Medicine
